# Patient Record
Sex: Male | Born: 1945
Health system: Southern US, Community
[De-identification: ages and names within clinical notes are randomized; demographics above are authoritative.]

## PROBLEM LIST (undated history)

## (undated) DIAGNOSIS — F319 Bipolar disorder, unspecified: Secondary | ICD-10-CM

## (undated) DIAGNOSIS — N138 Other obstructive and reflux uropathy: Secondary | ICD-10-CM

## (undated) DIAGNOSIS — F419 Anxiety disorder, unspecified: Secondary | ICD-10-CM

## (undated) DIAGNOSIS — F329 Major depressive disorder, single episode, unspecified: Secondary | ICD-10-CM

## (undated) DIAGNOSIS — N189 Chronic kidney disease, unspecified: Secondary | ICD-10-CM

## (undated) DIAGNOSIS — I10 Essential (primary) hypertension: Secondary | ICD-10-CM

## (undated) DIAGNOSIS — F32A Depression, unspecified: Secondary | ICD-10-CM

## (undated) DIAGNOSIS — N401 Enlarged prostate with lower urinary tract symptoms: Secondary | ICD-10-CM

## (undated) DIAGNOSIS — I2510 Atherosclerotic heart disease of native coronary artery without angina pectoris: Secondary | ICD-10-CM

## (undated) DIAGNOSIS — N182 Chronic kidney disease, stage 2 (mild): Secondary | ICD-10-CM

## (undated) DIAGNOSIS — N1831 Chronic kidney disease, stage 3a: Secondary | ICD-10-CM

## (undated) HISTORY — DX: Bipolar disorder, unspecified: F31.9

## (undated) HISTORY — DX: Depression, unspecified: F32.A

## (undated) HISTORY — PX: TRANSURETHRAL RESECTION OF PROSTATE: SHX73

## (undated) HISTORY — DX: Major depressive disorder, single episode, unspecified: F32.9

## (undated) HISTORY — DX: Anxiety disorder, unspecified: F41.9

## (undated) HISTORY — DX: Chronic kidney disease, stage 3a: N18.31

## (undated) HISTORY — DX: Benign prostatic hyperplasia with lower urinary tract symptoms: N13.8

## (undated) HISTORY — DX: Other obstructive and reflux uropathy: N40.1

## (undated) HISTORY — PX: PROSTATE SURGERY: SHX751

## (undated) HISTORY — DX: Chronic kidney disease, stage 2 (mild): N18.2

## (undated) HISTORY — DX: Chronic kidney disease, unspecified: N18.9

## (undated) HISTORY — PX: HERNIA REPAIR: SHX51

---

## 2000-08-27 ENCOUNTER — Encounter: Admission: RE | Admit: 2000-08-27 | Discharge: 2000-11-25 | Payer: Self-pay | Admitting: Psychiatry

## 2000-12-24 ENCOUNTER — Encounter: Admission: RE | Admit: 2000-12-24 | Discharge: 2001-03-24 | Payer: Self-pay | Admitting: Psychiatry

## 2001-07-17 ENCOUNTER — Encounter: Payer: Self-pay | Admitting: Emergency Medicine

## 2001-07-17 ENCOUNTER — Emergency Department (HOSPITAL_COMMUNITY): Admission: EM | Admit: 2001-07-17 | Discharge: 2001-07-17 | Payer: Self-pay | Admitting: Emergency Medicine

## 2002-12-14 ENCOUNTER — Encounter: Payer: Self-pay | Admitting: Family Medicine

## 2002-12-14 ENCOUNTER — Encounter: Admission: RE | Admit: 2002-12-14 | Discharge: 2002-12-14 | Payer: Self-pay | Admitting: Family Medicine

## 2010-05-28 ENCOUNTER — Encounter: Payer: Self-pay | Admitting: Cardiology

## 2010-05-28 ENCOUNTER — Ambulatory Visit: Payer: Self-pay | Admitting: Cardiology

## 2010-05-29 ENCOUNTER — Telehealth (INDEPENDENT_AMBULATORY_CARE_PROVIDER_SITE_OTHER): Payer: Self-pay

## 2010-05-30 ENCOUNTER — Ambulatory Visit: Payer: Self-pay

## 2010-05-30 ENCOUNTER — Encounter: Payer: Self-pay | Admitting: Cardiology

## 2010-05-30 ENCOUNTER — Ambulatory Visit: Payer: Self-pay | Admitting: Cardiology

## 2010-05-30 ENCOUNTER — Encounter (HOSPITAL_COMMUNITY): Admission: RE | Admit: 2010-05-30 | Discharge: 2010-07-18 | Payer: Self-pay | Admitting: Cardiology

## 2010-10-16 NOTE — Assessment & Plan Note (Signed)
Summary: Cardiology Nuclear Testing  Nuclear Med Background Indications for Stress Test: Evaluation for Ischemia, Abnormal EKG  Indications Comments: New RBBB   History Comments: NO DOCUMENTED CAD  Symptoms: Chest Tightness, Chest Tightness with Exertion, Fatigue with Exertion  Symptoms Comments: Last episode of CP:3 weeks ago.   Nuclear Pre-Procedure Cardiac Risk Factors: Obesity, RBBB Caffeine/Decaff Intake: none NPO After: 7:30 AM Lungs: Clear IV 0.9% NS with Angio Cath: 22g     IV Site: R Antecubital IV Started by: Bonnita Levan, RN Chest Size (in) 50     Height (in): 71 Weight (lb): 230 BMI: 32.19  Nuclear Med Study 1 or 2 day study:  1 day     Stress Test Type:  Stress Reading MD:  Willa Rough, MD     Referring MD:  Peter Swaziland, MD Resting Radionuclide:  Technetium 21m Tetrofosmin     Resting Radionuclide Dose:  11 mCi  Stress Radionuclide:  Technetium 54m Tetrofosmin     Stress Radionuclide Dose:  33 mCi   Stress Protocol Exercise Time (min):  11:00 min     Max HR:  137 bpm     Predicted Max HR:  158 bpm  Max Systolic BP: 157 mm Hg     Percent Max HR:  86.71 %     METS: 13.5 Rate Pressure Product:  30865    Stress Test Technologist:  [employer]a [employer], CMA-N     Nuclear Technologist:  Domenic Polite, CNMT  Rest Procedure  Myocardial perfusion imaging was performed at rest 45 minutes following the intravenous administration of Technetium 44m Tetrofosmin.  Stress Procedure  The patient exercised for eleven minutes.  The patient stopped due to fatigue and denied any chest pain.  There were no diagnostic ST-T wave changes.  Technetium 46m Tetrofosmin was injected at peak exercise and myocardial perfusion imaging was performed after a brief delay.  QPS Raw Data Images:  Normal; no motion artifact; normal heart/lung ratio. Stress Images:  Normal homogeneous uptake in all areas of the myocardium. Rest Images:  Normal homogeneous uptake in all areas of the  myocardium. Subtraction (SDS):  No evidence of ischemia. Transient Ischemic Dilatation:  1.04  (Normal <1.22)  Lung/Heart Ratio:  .39  (Normal <0.45)  Quantitative Gated Spect Images QGS EDV:  131 ml QGS ESV:  53 ml QGS EF:  59 % QGS cine images:  normal motion  Findings Normal nuclear study      Overall Impression  Exercise Capacity: Good exercise capacity. BP Response: Normal blood pressure response. Clinical Symptoms: SOB ECG Impression: No significant ST segment change suggestive of ischemia. Overall Impression: Normal stress nuclear study.

## 2010-10-16 NOTE — Progress Notes (Signed)
Summary: Nuc. Pre-Procedure  Phone Note Outgoing Call Call back at [facility] Phone [phone]   Call placed by: Irean Hong, RN,  May 29, 2010 2:02 PM Summary of Call: Left message with information on [employer] Information Sheet (see scanned document for details).      Nuclear Med Background Indications for Stress Test: Evaluation for Ischemia, Abnormal EKG  Indications Comments: New RBBB    Symptoms: Chest Pain, Chest Tightness with Exertion, Fatigue with Exertion    Nuclear Pre-Procedure Cardiac Risk Factors: RBBB

## 2011-09-02 ENCOUNTER — Encounter: Payer: Self-pay | Admitting: *Deleted

## 2011-09-02 ENCOUNTER — Other Ambulatory Visit: Payer: Self-pay

## 2011-09-02 ENCOUNTER — Emergency Department (HOSPITAL_COMMUNITY)
Admission: EM | Admit: 2011-09-02 | Discharge: 2011-09-03 | Disposition: A | Discharge status: Home / Self Care | Payer: 59 | Attending: Emergency Medicine | Admitting: Emergency Medicine

## 2011-09-02 ENCOUNTER — Emergency Department (HOSPITAL_COMMUNITY): Payer: 59

## 2011-09-02 DIAGNOSIS — Z79899 Other long term (current) drug therapy: Secondary | ICD-10-CM | POA: Insufficient documentation

## 2011-09-02 DIAGNOSIS — R0789 Other chest pain: Secondary | ICD-10-CM | POA: Insufficient documentation

## 2011-09-02 HISTORY — DX: Atherosclerotic heart disease of native coronary artery without angina pectoris: I25.10

## 2011-09-02 HISTORY — DX: Essential (primary) hypertension: I10

## 2011-09-02 LAB — CBC
MCH: 32 pg (ref 26.0–34.0)
MCHC: 34.4 g/dL (ref 30.0–36.0)
MCV: 93 fL (ref 78.0–100.0)
Platelets: 162 10*3/uL (ref 150–400)
RBC: 4.56 MIL/uL (ref 4.22–5.81)

## 2011-09-02 LAB — DIFFERENTIAL
Basophils Relative: 0 % (ref 0–1)
Eosinophils Absolute: 0.2 10*3/uL (ref 0.0–0.7)
Eosinophils Relative: 3 % (ref 0–5)
Lymphs Abs: 1.3 10*3/uL (ref 0.7–4.0)
Monocytes Relative: 9 % (ref 3–12)

## 2011-09-02 NOTE — ED Notes (Signed)
The pt arrived by gems from home where he was sitting on the cough watching tv  approx 1 hours ago.  By the time ems arrived he is pain had decreased to 6/10 then to 0 enroute without any meds.  No chest pain at present.  rbbb  That is normal for him.  No nv or sob with the pain.  Iv per ems

## 2011-09-02 NOTE — ED Notes (Signed)
The pt has no chest pain now.  He hiked 9 miles today without any pain or discomfort.  He justs wants water.  Wife at the bedside.  pts skin warm and dry no distress

## 2011-09-02 NOTE — ED Notes (Signed)
No chest pain wife still at the bedside

## 2011-09-02 NOTE — ED Provider Notes (Signed)
History     CSN: [id number] Arrival date & time: 09/02/2011  9:34 PM   First MD Initiated Contact with Patient 09/02/11 2138      Chief Complaint  Patient presents with  . Chest Pain    (Consider location/radiation/quality/duration/timing/severity/associated sxs/prior treatment) HPI Comments: Patient states discomfort started at about 6PM.  Earlier in the day he went for a 9 mile hike which is normal for him and he did not experience any symptoms while hiking.  No nausea, diaphoresis, radiation to arm or jaw.  No shortness of breath.  Stress test 2 years ago normal, no other cardiac history.    Patient is a 65 y.o. male presenting with chest pain. The history is provided by the patient.  Chest Pain The chest pain began 3 - 5 hours ago. Duration of episode(s) is 20 minutes. Chest pain occurs rarely. The chest pain is resolved. The pain is currently at 0/10. The severity of the pain is moderate. The pain does not radiate. Pertinent negatives for primary symptoms include no fever, no palpitations and no dizziness. He tried nothing for the symptoms. Risk factors include no known risk factors.     No past medical history on file.  No past surgical history on file.  No family history on file.  History  Substance Use Topics  . Smoking status: Not on file  . Smokeless tobacco: Not on file  . Alcohol Use: Not on file      Review of Systems  Constitutional: Negative for fever.  Cardiovascular: Positive for chest pain. Negative for palpitations.  Neurological: Negative for dizziness.  All other systems reviewed and are negative.    Allergies  Review of patient's allergies indicates no known allergies.  Home Medications   Current Outpatient Rx  Name Route Sig Dispense Refill  . CLONAZEPAM 0.5 MG PO TABS Oral Take 0.5 mg by mouth 2 (two) times daily as needed. For anxiety.     Marland Kitchen LAMOTRIGINE 200 MG PO TABS Oral Take 200 mg by mouth daily.      Marland Kitchen LITHIUM CARBONATE 300 MG PO  CAPS Oral Take 300-600 mg by mouth 2 (two) times daily with a meal. Take 1 capsule (300 MG) in the morning and 2 capsules (600 MG) at night.       BP 129/81  Pulse 79  Temp 98.3 F (36.8 C)  Resp 20  SpO2 97%  Physical Exam  Nursing note and vitals reviewed. Constitutional: He is oriented to person, place, and time. He appears well-developed and well-nourished. No distress.  HENT:  Head: Normocephalic and atraumatic.  Neck: Normal range of motion. Neck supple.  Cardiovascular: Normal rate and regular rhythm.  Exam reveals no friction rub.   No murmur heard. Pulmonary/Chest: Effort normal and breath sounds normal. No respiratory distress. He has no wheezes.  Abdominal: Soft. Bowel sounds are normal. He exhibits no distension. There is no tenderness.  Musculoskeletal: Normal range of motion. He exhibits no edema.  Neurological: He is alert and oriented to person, place, and time.  Skin: Skin is warm and dry. He is not diaphoretic.    ED Course  Procedures (including critical care time)  Labs Reviewed - No data to display No results found.   No diagnosis found.   Date: 09/02/2011  Rate: 79  Rhythm: normal sinus rhythm  QRS Axis: normal  Intervals: normal  ST/T Wave abnormalities: indeterminate  Conduction Disutrbances:right bundle branch block  Narrative Interpretation:   Old EKG Reviewed: none available  MDM  Christian Parsons symptoms are atypical for cardiac pain.  The ekg shows a RBBB which is old, and the enzymes are negative.  He hikes 9 miles several days per week and has not had any discomfort or shortness of breath.  The patient would like to go home and tells me he can get in to see Dr. Swaziland in the very near future.  Will discharge to home, and return prn.          Christian Lyons, MD 09/03/11 [phone]

## 2011-09-03 ENCOUNTER — Encounter (HOSPITAL_COMMUNITY): Payer: Self-pay | Admitting: *Deleted

## 2011-09-03 LAB — CK TOTAL AND CKMB (NOT AT ARMC)
CK, MB: 5.9 ng/mL — ABNORMAL HIGH (ref 0.3–4.0)
Relative Index: 4.1 — ABNORMAL HIGH (ref 0.0–2.5)
Total CK: 143 U/L (ref 7–232)

## 2011-09-03 LAB — COMPREHENSIVE METABOLIC PANEL
BUN: 21 mg/dL (ref 6–23)
Calcium: 9.4 mg/dL (ref 8.4–10.5)
GFR calc Af Amer: 61 mL/min — ABNORMAL LOW (ref >90)
Glucose, Bld: 111 mg/dL — ABNORMAL HIGH (ref 70–99)
Sodium: 141 mEq/L (ref 135–145)
Total Protein: 6.7 g/dL (ref 6.0–8.3)

## 2011-09-03 LAB — TROPONIN I: Troponin I: <0.3 ng/mL (ref <0.30)

## 2011-11-15 ENCOUNTER — Telehealth: Payer: Self-pay

## 2011-11-15 DIAGNOSIS — M25572 Pain in left ankle and joints of left foot: Secondary | ICD-10-CM

## 2011-11-15 NOTE — Telephone Encounter (Signed)
PT STATES IN THE PAST DR DOOLITTLE HAD REFERRED PT TO AN ORTHO FOR ORTHOTICS. PTS STATES THE ORTHOTICS HAVE WORN OUT AND HE NEEDS A REFERRAL TO AN ORTHO TO GET ANOTHER SET OF ORTHOTIC. PLEASE CALL PT AT HOME NUMBER TO ADVISE

## 2011-11-15 NOTE — Telephone Encounter (Signed)
Did the referral

## 2011-11-15 NOTE — Telephone Encounter (Signed)
Patient had CPE  In February 2012... And seen last Oct 2012, can we refer?

## 2011-12-20 ENCOUNTER — Encounter: Payer: Self-pay | Admitting: *Deleted

## 2011-12-20 DIAGNOSIS — F39 Unspecified mood [affective] disorder: Secondary | ICD-10-CM | POA: Insufficient documentation

## 2011-12-25 ENCOUNTER — Ambulatory Visit (INDEPENDENT_AMBULATORY_CARE_PROVIDER_SITE_OTHER): Payer: 59 | Admitting: Internal Medicine

## 2011-12-25 ENCOUNTER — Encounter: Payer: Self-pay | Admitting: Internal Medicine

## 2011-12-25 VITALS — BP 125/74 | HR 63 | Temp 98.2°F | Resp 16 | Ht 72.0 in | Wt 234.6 lb

## 2011-12-25 DIAGNOSIS — G43109 Migraine with aura, not intractable, without status migrainosus: Secondary | ICD-10-CM

## 2011-12-25 DIAGNOSIS — Z6831 Body mass index (BMI) 31.0-31.9, adult: Secondary | ICD-10-CM | POA: Insufficient documentation

## 2011-12-25 DIAGNOSIS — R972 Elevated prostate specific antigen [PSA]: Secondary | ICD-10-CM

## 2011-12-25 DIAGNOSIS — I4510 Unspecified right bundle-branch block: Secondary | ICD-10-CM

## 2011-12-25 DIAGNOSIS — Z0000 Encounter for general adult medical examination without abnormal findings: Secondary | ICD-10-CM

## 2011-12-25 LAB — COMPREHENSIVE METABOLIC PANEL
AST: 21 U/L (ref 0–37)
Albumin: 4.4 g/dL (ref 3.5–5.2)
BUN: 18 mg/dL (ref 6–23)
Calcium: 10 mg/dL (ref 8.4–10.5)
Chloride: 110 mEq/L (ref 96–112)
Potassium: 4.7 mEq/L (ref 3.5–5.3)

## 2011-12-25 LAB — LIPID PANEL: LDL Cholesterol: 64 mg/dL (ref 0–99)

## 2011-12-25 LAB — POCT URINALYSIS DIPSTICK
Blood, UA: NEGATIVE
Glucose, UA: NEGATIVE
Nitrite, UA: NEGATIVE
Protein, UA: NEGATIVE
Urobilinogen, UA: 0.2
pH, UA: 6.5

## 2011-12-25 LAB — CBC WITH DIFFERENTIAL/PLATELET
Basophils Absolute: 0 10*3/uL (ref 0.0–0.1)
HCT: 44.9 % (ref 39.0–52.0)
Hemoglobin: 15.2 g/dL (ref 13.0–17.0)
Lymphocytes Relative: 22 % (ref 12–46)
Monocytes Absolute: 0.4 10*3/uL (ref 0.1–1.0)
Monocytes Relative: 7 % (ref 3–12)
Neutro Abs: 3.6 10*3/uL (ref 1.7–7.7)
RBC: 4.76 MIL/uL (ref 4.22–5.81)
RDW: 12.8 % (ref 11.5–15.5)
WBC: 5.2 10*3/uL (ref 4.0–10.5)

## 2011-12-25 MED ORDER — RIZATRIPTAN BENZOATE 10 MG PO TABS: 10.0000 mg | ORAL_TABLET | ORAL | Status: DC | PRN

## 2011-12-25 NOTE — Progress Notes (Signed)
Subjective:    Patient ID: Christian Parsons, male    DOB: [birth year], 66 y.o.   MRN: [id number]  HPI Annual exam Patient Active Problem List  Diagnoses  . Bipolar disorder  . Migraine headache with aura  . BMI 31.0-31.9,adult  . Right bundle branch block  He is doing very well In the summer he had an episode of substernal chest burning and discomfort. EMS took him to the emergency room where her EKG was unchanged from past and enzymes were normal. He's had no recurrence of that. He continues to notice a mild shortness of breath when he exerts himself. Dr. Swaziland did a cardiac evaluation with ETT which was normal 2 and half years ago. He walks daily and Hikes 9-10 miles at least twice a week. He is planning an 8 day-80 mile hike through [place] next month.  He continues on Zyflamend And he has no prostate symptoms and a normal PSA since his normal biopsy.  Dr. Lottie Mussel has called him for a five-year followup colonoscopy. This would be his third colonoscopy and he is reluctant, wanting to wait a full 10 years.  His migraine headaches Are stable and respond to Maxalt  He continues to see Dr. Nolen Mu for bipolar disorder which is very stable on his medications.  Social history: he loves retirement   Review of Systems  Constitutional: Negative.        He continues to strive for weight loss. He had a high in the 270s and is now 234. He hopes to increase walking to achieve more results.  HENT: Negative.   Eyes: Negative.   Respiratory: Negative.   Cardiovascular: Negative.   Gastrointestinal: Negative.   Genitourinary: Negative.   Musculoskeletal: Negative.   Skin: Negative.        His chronic nail fungus on his toes does not cause pain  Neurological: Negative.   Hematological: Negative.   Psychiatric/Behavioral: Negative.        Objective:   Physical Exam  Constitutional: He is oriented to person, place, and time. He appears well-developed and well-nourished.  HENT:    Head: Normocephalic.  Nose: Nose normal.  Mouth/Throat: Oropharynx is clear and moist.  Eyes: Conjunctivae and EOM are normal. Pupils are equal, round, and reactive to light.  Neck: Normal range of motion. Neck supple. No thyromegaly present.  Cardiovascular: Normal rate, regular rhythm, normal heart sounds and intact distal pulses.  Exam reveals no gallop.   No murmur heard.      No carotid or abdominal bruits  Pulmonary/Chest: Effort normal and breath sounds normal. He has no wheezes.  Abdominal: Soft. Bowel sounds are normal. He exhibits no mass. There is no tenderness.       No organomegaly  Genitourinary:       He elects no prostate exam and I agree with him  Musculoskeletal: Normal range of motion. He exhibits no edema and no tenderness.  Neurological: He is alert and oriented to person, place, and time. He has normal reflexes. No cranial nerve deficit.  Skin:       Clear except for fungal changes on all the males of his lower extremities  Psychiatric: He has a normal mood and affect. His behavior is normal. Judgment and thought content normal.    EKG repeated and shows no sign of acute or chronic problems other than the right bundle branch block which is stable      Assessment & Plan:  Annual exam Patient Active Problem List  Diagnoses  .  Bipolar disorder  . Migraine headache with aura  . BMI 31.0-31.9,adult  . Right bundle branch block   Maxalt refills for one year His other medicines come from his psychiatrist He is advised to try a training program that involves interval training walking up hill/if that presents a problem over 12 weeks we will consider a cardiology followup and whether or not catheterization is needed.

## 2011-12-30 ENCOUNTER — Encounter: Payer: Self-pay | Admitting: Internal Medicine

## 2012-01-14 ENCOUNTER — Ambulatory Visit: Payer: 59 | Admitting: Family Medicine

## 2012-01-14 VITALS — BP 108/68 | HR 81 | Temp 97.5°F | Resp 18 | Ht 72.5 in | Wt 232.0 lb

## 2012-01-14 DIAGNOSIS — J329 Chronic sinusitis, unspecified: Secondary | ICD-10-CM

## 2012-01-14 DIAGNOSIS — J40 Bronchitis, not specified as acute or chronic: Secondary | ICD-10-CM

## 2012-01-14 MED ORDER — AMOXICILLIN 875 MG PO TABS: 875.0000 mg | ORAL_TABLET | Freq: Two times a day (BID) | ORAL | Status: AC

## 2012-01-14 MED ORDER — BENZONATATE 100 MG PO CAPS: ORAL_CAPSULE | ORAL | Status: AC

## 2012-01-14 MED ORDER — FLUTICASONE PROPIONATE 50 MCG/ACT NA SUSP: 2.0000 | Freq: Every day | NASAL | Status: DC

## 2012-01-14 MED ORDER — HYDROCODONE-HOMATROPINE 5-1.5 MG/5ML PO SYRP: 5.0000 mL | ORAL_SOLUTION | ORAL | Status: AC | PRN

## 2012-01-14 NOTE — Progress Notes (Signed)
Subjective: Christian Parsons was here for a physical examination several weeks ago. He probably went and got ill after being here. He developed a sore throat, head congestion, cough, and just not feeling well. He is taking a lot of OTC cough and cold medications without good relief. He has been blowing some purulent stuff from his nose, low bit bloated today. He also coughs up some stuff. He may have had a little bit of wheezing. His throat is not as sore as it was.  He had questions about his cat which has been ill with similar symptoms, and wondering whether he could have gotten anything there.  He hikes, and is present at the hiking club. They'll be going to [place] in June 2 hikes. He took a 7 mile walk the other day, but not to call extraneous it sounds like.  He's not been running a fever just doesn't feel good with this. The cough is more in the daytime but it wakes him coughing sometimes at night.   Objective: White male, appearing like he doesn't feel real well. He says he has lost 50 pounds since he started doing this hiking. TMs are normal. Throat has mild erythema, looks like some postnasal drainage type appearance. Neck supple without significant nodes. Chest has some minimal rhonchi at the bases. Although he does not have great air exchange, do not hear any well-defined wheezing. He is continuing to cough.  Assessment: Sinusitis/bronchitis  Plan: I do not believe that labs and x-rays are needed at this time.  It has been going on a long period of time so am  going ahead and treating him with antibiotics and cough medications. If it is getting worse then we would have to do additional workup.  I do not have answers about any cat to human transmission of significant respiratory tract infections.

## 2012-01-14 NOTE — Patient Instructions (Signed)

## 2012-03-11 ENCOUNTER — Other Ambulatory Visit: Payer: Self-pay | Admitting: Emergency Medicine

## 2012-03-11 ENCOUNTER — Ambulatory Visit: Payer: 59 | Admitting: Emergency Medicine

## 2012-03-11 VITALS — BP 124/76 | HR 63 | Temp 98.4°F | Resp 16 | Ht 72.0 in | Wt 229.4 lb

## 2012-03-11 DIAGNOSIS — D235 Other benign neoplasm of skin of trunk: Secondary | ICD-10-CM

## 2012-03-11 DIAGNOSIS — K629 Disease of anus and rectum, unspecified: Secondary | ICD-10-CM

## 2012-03-11 NOTE — Progress Notes (Signed)
  Subjective:    Patient ID: Christian Parsons, male    DOB: 1946-05-02, 66 y.o.   MRN: [id number]  HPI patient enters with a long history of a growth-like area on his left buttocks. He enters today to have it removed because it has been giving him pain in the area    Review of Systems     Objective:   Physical Exam there is a 1 x 1 cm raised firm nodular area over the left buttocks. This area was prepped with Betadine and numbed with 1% removed with iris scissors base cauterized with silver nitrate patient tolerated the procedure well        Assessment & Plan:  Lesion left buttocks. I suspect this lesion is benign. Area removed and sent for path

## 2012-03-18 ENCOUNTER — Encounter: Payer: Self-pay | Admitting: Family Medicine

## 2012-07-24 ENCOUNTER — Ambulatory Visit: Payer: 59 | Admitting: Radiology

## 2012-07-24 VITALS — BP 120/74

## 2012-07-24 DIAGNOSIS — Z23 Encounter for immunization: Secondary | ICD-10-CM

## 2012-11-03 ENCOUNTER — Ambulatory Visit (INDEPENDENT_AMBULATORY_CARE_PROVIDER_SITE_OTHER): Payer: 59 | Admitting: Physician Assistant

## 2012-11-03 VITALS — BP 127/83 | HR 69 | Temp 98.1°F | Resp 16 | Ht 71.0 in | Wt 231.0 lb

## 2012-11-03 DIAGNOSIS — M79646 Pain in unspecified finger(s): Secondary | ICD-10-CM

## 2012-11-03 DIAGNOSIS — L03019 Cellulitis of unspecified finger: Secondary | ICD-10-CM

## 2012-11-03 DIAGNOSIS — M79609 Pain in unspecified limb: Secondary | ICD-10-CM

## 2012-11-03 DIAGNOSIS — L03012 Cellulitis of left finger: Secondary | ICD-10-CM

## 2012-11-03 DIAGNOSIS — L989 Disorder of the skin and subcutaneous tissue, unspecified: Secondary | ICD-10-CM

## 2012-11-03 MED ORDER — SULFAMETHOXAZOLE-TRIMETHOPRIM 800-160 MG PO TABS: 1.0000 | ORAL_TABLET | Freq: Two times a day (BID) | ORAL | Status: DC

## 2012-11-03 NOTE — Progress Notes (Addendum)
  Subjective:    Patient ID: Christian Parsons, male    DOB: 1946/04/10, 67 y.o.   MRN: [id number]  HPI This 67 y.o. male presents for evaluation of pain and swelling of the LEFT thumb after "stubbing the nail" on a hike about a week ago.  Has gotten substantially worse over the past 24 hours.  Additionally, he notes a "mole" on the LEFT middle finger that he "bumped" and is now sore and irritated.  No fever, chills.  No GI/GU symptoms.  He is RIGHT hand dominant.   Past Medical History  Diagnosis Date  . Coronary artery disease   . Hypertension   . Bipolar disorder     H/O    History reviewed. No pertinent past surgical history.  Prior to Admission medications   Medication Sig Start Date End Date Taking? Authorizing Provider  aspirin 81 MG tablet Take 81 mg by mouth daily.   Yes Historical Provider, MD  clonazePAM (KLONOPIN) 0.5 MG tablet Take 0.5 mg by mouth 2 (two) times daily as needed. For anxiety.    Yes Historical Provider, MD  lamoTRIgine (LAMICTAL) 200 MG tablet Take 200 mg by mouth daily.     Yes Historical Provider, MD  lithium carbonate 300 MG capsule Take 300-600 mg by mouth 2 (two) times daily with a meal. Take 1 capsule (300 MG) in the morning and 2 capsules (600 MG) at night.    Yes Historical Provider, MD  rizatriptan (MAXALT) 10 MG tablet Take 1 tablet (10 mg total) by mouth as needed. May repeat in 2 hours if needed 12/25/11  Yes Tonye Pearson, MD  fluticasone [facility]) 50 MCG/ACT nasal spray Place 2 sprays into the nose daily. 01/14/12 01/13/13  Peyton Najjar, MD  OVER THE COUNTER MEDICATION Take by mouth 2 (two) times daily. Zyflamend    Historical Provider, MD    No Known Allergies  History   Social History  . Marital Status: Married   Occupational History  . Retired Firefighter   Social History Main Topics  . Smoking status: Never Smoker   . Alcohol Use: Yes  . Drug Use: No    History reviewed. No pertinent family history.   Review of  Systems As above.    Objective:   Physical Exam Blood pressure 127/83, pulse 69, temperature 98.1 F (36.7 C), temperature source Oral, resp. rate 16, height 5\' 11"  (1.803 m), weight 231 lb (104.781 kg), SpO2 100.00%. Body mass index is 32.23 kg/(m^2). Well-developed, well nourished WM who is awake, alert and oriented, in NAD. HEENT: [place]/AT, sclera and conjunctiva are clear.   Heart: RRR Lungs: normal effort Extremities: no cyanosis, clubbing.  LEFT thumb is noted with erythema and edema around the nail, consistent with paronychia. Very tender. Skin: warm and dry. Changes consistent with paronychia, as above.   Psychologic: good mood and appropriate affect, normal speech and behavior.  PROCEDURE: VCO. Cleansed with alcohol pad and 11 blade used to incise along nail fold.  Large purulence expressed.  Cleansed and dressed.      Assessment & Plan:  Paronychia of left thumb - Plan: Wound culture, sulfamethoxazole-trimethoprim (BACTRIM DS,SEPTRA DS) 800-160 MG per tablet.  Local wound care.  Anticipatory guidance provided.  Lesion of finger  Pain in finger

## 2012-11-03 NOTE — Patient Instructions (Signed)
Please soak the thumb in warm water for 15-20 minutes 2-3 times each day.  Wash daily with soap and water. Apply an antibiotic ointment and bandaid until the wound is healed.  If you have increasing pain, drainage, redness or swelling, please return for re-evaluation.  The finger wound may get "angry" before it heals.  If so, wash daily with soap and water and apply an antibiotic ointment and a bandaid. If any lesion remains present in 2 weeks, return for re-evaluation and possible re-treatment.

## 2012-11-05 LAB — WOUND CULTURE

## 2013-01-13 ENCOUNTER — Encounter: Payer: 59 | Admitting: Internal Medicine

## 2013-02-05 ENCOUNTER — Other Ambulatory Visit: Payer: Self-pay | Admitting: Internal Medicine

## 2013-04-07 ENCOUNTER — Encounter: Payer: 59 | Admitting: Internal Medicine

## 2013-04-09 DIAGNOSIS — F319 Bipolar disorder, unspecified: Secondary | ICD-10-CM | POA: Diagnosis not present

## 2013-04-13 DIAGNOSIS — F319 Bipolar disorder, unspecified: Secondary | ICD-10-CM | POA: Diagnosis not present

## 2013-04-27 DIAGNOSIS — F319 Bipolar disorder, unspecified: Secondary | ICD-10-CM | POA: Diagnosis not present

## 2013-05-27 DIAGNOSIS — F319 Bipolar disorder, unspecified: Secondary | ICD-10-CM | POA: Diagnosis not present

## 2013-06-01 ENCOUNTER — Other Ambulatory Visit: Payer: Self-pay | Admitting: Internal Medicine

## 2013-06-16 ENCOUNTER — Ambulatory Visit (INDEPENDENT_AMBULATORY_CARE_PROVIDER_SITE_OTHER): Payer: Medicare Other | Admitting: Internal Medicine

## 2013-06-16 ENCOUNTER — Encounter: Payer: Self-pay | Admitting: Internal Medicine

## 2013-06-16 VITALS — BP 134/72 | HR 57 | Temp 97.6°F | Resp 16 | Ht 71.3 in | Wt 217.0 lb

## 2013-06-16 DIAGNOSIS — Z125 Encounter for screening for malignant neoplasm of prostate: Secondary | ICD-10-CM

## 2013-06-16 DIAGNOSIS — Z5181 Encounter for therapeutic drug level monitoring: Secondary | ICD-10-CM

## 2013-06-16 DIAGNOSIS — N429 Disorder of prostate, unspecified: Secondary | ICD-10-CM | POA: Diagnosis not present

## 2013-06-16 DIAGNOSIS — Z23 Encounter for immunization: Secondary | ICD-10-CM

## 2013-06-16 DIAGNOSIS — G250 Essential tremor: Secondary | ICD-10-CM

## 2013-06-16 DIAGNOSIS — Z13220 Encounter for screening for lipoid disorders: Secondary | ICD-10-CM

## 2013-06-16 DIAGNOSIS — Z0000 Encounter for general adult medical examination without abnormal findings: Secondary | ICD-10-CM | POA: Diagnosis not present

## 2013-06-16 DIAGNOSIS — Z136 Encounter for screening for cardiovascular disorders: Secondary | ICD-10-CM

## 2013-06-16 DIAGNOSIS — F319 Bipolar disorder, unspecified: Secondary | ICD-10-CM

## 2013-06-16 LAB — CBC WITH DIFFERENTIAL/PLATELET
Eosinophils Absolute: 0.1 10*3/uL (ref 0.0–0.7)
Eosinophils Relative: 2 % (ref 0–5)
HCT: 44.2 % (ref 39.0–52.0)
Hemoglobin: 15.5 g/dL (ref 13.0–17.0)
Lymphs Abs: 0.8 10*3/uL (ref 0.7–4.0)
MCH: 32.7 pg (ref 26.0–34.0)
MCV: 93.2 fL (ref 78.0–100.0)
Monocytes Absolute: 0.4 10*3/uL (ref 0.1–1.0)
Monocytes Relative: 8 % (ref 3–12)
RBC: 4.74 MIL/uL (ref 4.22–5.81)

## 2013-06-16 LAB — POCT URINALYSIS DIPSTICK
Blood, UA: NEGATIVE
Glucose, UA: NEGATIVE
Nitrite, UA: NEGATIVE
Protein, UA: NEGATIVE
Urobilinogen, UA: 0.2

## 2013-06-16 LAB — COMPREHENSIVE METABOLIC PANEL
CO2: 28 mEq/L (ref 19–32)
Calcium: 9.8 mg/dL (ref 8.4–10.5)
Creat: 1.41 mg/dL — ABNORMAL HIGH (ref 0.50–1.35)
Glucose, Bld: 95 mg/dL (ref 70–99)
Total Bilirubin: 0.9 mg/dL (ref 0.3–1.2)
Total Protein: 6.8 g/dL (ref 6.0–8.3)

## 2013-06-16 NOTE — Progress Notes (Signed)
Subjective:    Patient ID: Christian Parsons, male    DOB: 1946/05/01, 67 y.o.   MRN: [id number]  HPI Patient presents today for CPE Patient continues to hike and has lost more weight. Last trip hiking in [place]. Last CPE- 4/13 Colonoscopy-2011, needs 2016 Tetanus-  Flu- today  Happy in retirement  Would like to have EKG today.   Wife, Christian Parsons, had fever, chest congestion, tx with antibiotics several times. Started in March. Was sick 3x, was seen here and at [facility]. Eventually saw Dr. Katrinka Blazing who sent her to Dr. Ilsa Iha. Diagnosed with blood infection. Was hospitalized for 4 days and treated for strep. mutans. Thinks this followed a dental cleaning. Diagnosed with endocarditis, but vegetation not visible. Still on IV antibiotics through PICC line.   Old tremor. Father had tremor. Lithium makes it worse. Reduction helps some.  Decreased concentration lately. Plays a lot of online chess against other people and his rank has decreased and he is unable to finish as before. Thinks this is related to worrying about his wife's illness. Hiking helps with this.  A little more difficulty with urine stream over the last year and a half. Doesn't want to see urologist, "they almost killed me and they only get one shot." Open to having PSA checked today. Nocturia x 1. Feels like he urinates frequently than others.  Review of Systems    see cna note. 13 systems. No new sxtoms Objective:   Physical Exam  Constitutional: He is oriented to person, place, and time. He appears well-developed and well-nourished.  HENT:  Head: Normocephalic.  Right Ear: External ear normal.  Left Ear: External ear normal.  Nose: Nose normal.  Mouth/Throat: Oropharynx is clear and moist.  Tms and canals clear  Eyes: Conjunctivae and EOM are normal. Pupils are equal, round, and reactive to light.  Neck: Normal range of motion. Neck supple. No thyromegaly present.  Cardiovascular: Normal rate, regular rhythm, normal heart  sounds and intact distal pulses.   No murmur heard. Pulmonary/Chest: Effort normal and breath sounds normal. No respiratory distress. He has no wheezes. He has no rales.  Abdominal: Soft. Bowel sounds are normal. He exhibits no distension and no mass. There is no tenderness. There is no rebound and no guarding.  No hepatosplenomegaly  Musculoskeletal: Normal range of motion. He exhibits no edema and no tenderness.  Lymphadenopathy:    He has no cervical adenopathy.  Neurological: He is alert and oriented to person, place, and time. He has normal reflexes. No cranial nerve deficit. He exhibits normal muscle tone. Coordination normal.  Skin: Skin is warm and dry. No rash noted.  Psychiatric: He has a normal mood and affect. His behavior is normal. Judgment and thought content normal.    ekg unchanged      Assessment & Plan:  cpe stable  Walking 19 miles 3300 ft elevation tomorrow!  Routine general medical examination at a health care facility - Plan: EKG 12-Lead, CBC with Differential, Comprehensive metabolic panel, PSA, POCT urinalysis dipstick  Need for prophylactic vaccination and inoculation against influenza - Plan: Flu Vaccine QUAD 36+ mos IM  Bipolar disorder - Plan: TSH  Benign essential tremor  Screening for prostate cancer - Plan: PSA  Screening, lipid  Medication monitoring encounter - Plan: CBC with Differential, TSH  Disorder of prostate  - Plan: PSA  Screening for cardiovascular condition   Need for prophylactic vaccination with combined diphtheria-tetanus-pertussis (DTP) vaccine - Plan: Tdap vaccine greater than or equal to 7yo  IM

## 2013-06-16 NOTE — Progress Notes (Signed)
  Subjective:    Patient ID: Christian Parsons, male    DOB: 05/07/1946, 67 y.o.   MRN: [id number]  HPI    Review of Systems  Constitutional: Negative.   HENT: Negative.   Eyes: Negative.   Respiratory: Negative.   Cardiovascular: Negative.   Gastrointestinal: Negative.   Endocrine: Negative.   Genitourinary: Positive for frequency and difficulty urinating.  Musculoskeletal: Negative.   Skin: Negative.   Allergic/Immunologic: Negative.   Neurological: Positive for tremors.  Hematological: Negative.   Psychiatric/Behavioral: Positive for decreased concentration.       Objective:   Physical Exam        Assessment & Plan:

## 2013-06-17 LAB — TSH: TSH: 1.773 u[IU]/mL (ref 0.350–4.500)

## 2013-06-17 LAB — PSA: PSA: 4.31 ng/mL — ABNORMAL HIGH (ref <=4.00)

## 2013-06-21 ENCOUNTER — Encounter: Payer: Self-pay | Admitting: Internal Medicine

## 2013-06-24 DIAGNOSIS — F319 Bipolar disorder, unspecified: Secondary | ICD-10-CM | POA: Diagnosis not present

## 2013-07-08 DIAGNOSIS — F319 Bipolar disorder, unspecified: Secondary | ICD-10-CM | POA: Diagnosis not present

## 2013-07-20 DIAGNOSIS — F319 Bipolar disorder, unspecified: Secondary | ICD-10-CM | POA: Diagnosis not present

## 2013-07-28 ENCOUNTER — Ambulatory Visit (INDEPENDENT_AMBULATORY_CARE_PROVIDER_SITE_OTHER): Payer: Medicare Other | Admitting: Internal Medicine

## 2013-07-28 ENCOUNTER — Encounter: Payer: Self-pay | Admitting: Internal Medicine

## 2013-07-28 VITALS — BP 144/88 | HR 82 | Temp 97.7°F | Resp 16 | Ht 72.0 in | Wt 219.4 lb

## 2013-07-28 DIAGNOSIS — R799 Abnormal finding of blood chemistry, unspecified: Secondary | ICD-10-CM | POA: Diagnosis not present

## 2013-07-28 DIAGNOSIS — R300 Dysuria: Secondary | ICD-10-CM

## 2013-07-28 DIAGNOSIS — R7989 Other specified abnormal findings of blood chemistry: Secondary | ICD-10-CM

## 2013-07-28 LAB — POCT URINALYSIS DIPSTICK
Bilirubin, UA: NEGATIVE
Glucose, UA: NEGATIVE
Ketones, UA: NEGATIVE
Nitrite, UA: NEGATIVE
pH, UA: 7

## 2013-07-28 LAB — POCT UA - MICROSCOPIC ONLY: Yeast, UA: NEGATIVE

## 2013-07-28 LAB — BASIC METABOLIC PANEL
BUN: 18 mg/dL (ref 6–23)
Calcium: 9.9 mg/dL (ref 8.4–10.5)
Potassium: 4.4 mEq/L (ref 3.5–5.3)

## 2013-07-28 MED ORDER — CIPROFLOXACIN HCL 500 MG PO TABS: 500.0000 mg | ORAL_TABLET | Freq: Two times a day (BID) | ORAL | Status: DC

## 2013-07-28 NOTE — Progress Notes (Signed)
  Subjective:    Patient ID: Christian Parsons, male    DOB: Nov 17, 1945, 67 y.o.   MRN: [id number]  HPIc/o intermitt dysu and freq for 2 weeks Last week episode LLQ/groin pain No hematuria No fever No new partners Hx pros enlrg w/ abn PSA foll by urol Nocturia x4 this week   See last cr---often this level  Review of Systems Non contr    Objective:   Physical Exam BP 144/88  Pulse 82  Temp(Src) 97.7 F (36.5 C) (Oral)  Resp 16  Ht 6' (1.829 m)  Wt 219 lb 6.4 oz (99.519 kg)  BMI 29.75 kg/m2  SpO2 98% abd benign No flank tend No hernia No d/c no groin or test pain  Results for orders placed in visit on 07/28/13  BASIC METABOLIC PANEL      Result Value Range   Sodium 140  135 - 145 mEq/L   Potassium 4.4  3.5 - 5.3 mEq/L   Chloride 104  96 - 112 mEq/L   CO2 26  19 - 32 mEq/L   Glucose, Bld 92  70 - 99 mg/dL   BUN 18  6 - 23 mg/dL   Creat 2.44 (*) 0.10 - 1.35 mg/dL   Calcium 9.9  8.4 - 27.2 mg/dL  POCT UA - MICROSCOPIC ONLY      Result Value Range   WBC, Ur, HPF, POC 0-4     RBC, urine, microscopic 0-1     Bacteria, U Microscopic neg     Mucus, UA neg     Epithelial cells, urine per micros 0-1     Crystals, Ur, HPF, POC neg     Casts, Ur, LPF, POC neg     Yeast, UA neg    POCT URINALYSIS DIPSTICK      Result Value Range   Color, UA yellow     Clarity, UA clear     Glucose, UA neg     Bilirubin, UA neg     Ketones, UA neg     Spec Grav, UA 1.015     Blood, UA neg     pH, UA 7.0     Protein, UA neg     Urobilinogen, UA 0.2     Nitrite, UA neg     Leukocytes, UA Trace          Assessment & Plan:  Elevated serum creatinine - Plan: Basic metabolic panel  Dysuria - Plan:  Urine culture  prostat vs uti  cipro 500bid call

## 2013-07-30 LAB — URINE CULTURE: Organism ID, Bacteria: NO GROWTH

## 2013-08-02 ENCOUNTER — Telehealth: Payer: Self-pay

## 2013-08-02 NOTE — Telephone Encounter (Signed)
Patient came in, he does not want to see a nephrologist, he wants to know if you can just continue to keep an eye on this, he is encouraged to make sure he gets plenty of fluids.

## 2013-08-02 NOTE — Telephone Encounter (Signed)
This is fine since it is stable Did cipro help?

## 2013-08-02 NOTE — Telephone Encounter (Signed)
PT STATES HE NEED TO SEE ANOTHER DR AND WANTED TO KNOW IF HIS CREATINE LEVELS WERE BACK PLEASE CALL [phone] AND WOULD LIKE TO KNOW ASAP

## 2013-08-02 NOTE — Telephone Encounter (Signed)
Sorry, did not ask this, it is on the lab. I have called and his wife advised he has improved on the cipro, he was mainly concerned about the labs. They are aware we can continue to follow the labs, thanks.

## 2013-08-02 NOTE — Telephone Encounter (Signed)
What would you like me to advise concerning labs?

## 2013-08-02 NOTE — Telephone Encounter (Signed)
Thanks. Called to advise. Left message for him to call me back.

## 2013-08-02 NOTE — Telephone Encounter (Signed)
Call to say the same as in the past--slightly up Unsure if related to long term lithium or not We can ask nephrology for an opinion next if he wishes

## 2013-08-05 DIAGNOSIS — F319 Bipolar disorder, unspecified: Secondary | ICD-10-CM | POA: Diagnosis not present

## 2013-08-18 DIAGNOSIS — F319 Bipolar disorder, unspecified: Secondary | ICD-10-CM | POA: Diagnosis not present

## 2013-08-23 ENCOUNTER — Encounter: Payer: Self-pay | Admitting: Cardiology

## 2013-08-25 ENCOUNTER — Encounter: Payer: Self-pay | Admitting: Internal Medicine

## 2013-08-25 ENCOUNTER — Ambulatory Visit (INDEPENDENT_AMBULATORY_CARE_PROVIDER_SITE_OTHER): Payer: Medicare Other | Admitting: Internal Medicine

## 2013-08-25 VITALS — BP 130/80 | HR 58 | Temp 97.9°F | Resp 16 | Ht 72.0 in | Wt 218.6 lb

## 2013-08-25 DIAGNOSIS — R799 Abnormal finding of blood chemistry, unspecified: Secondary | ICD-10-CM | POA: Diagnosis not present

## 2013-08-25 DIAGNOSIS — R7989 Other specified abnormal findings of blood chemistry: Secondary | ICD-10-CM

## 2013-08-25 LAB — POCT URINALYSIS DIPSTICK
Bilirubin, UA: NEGATIVE
Glucose, UA: NEGATIVE
Leukocytes, UA: NEGATIVE
Nitrite, UA: NEGATIVE
Protein, UA: NEGATIVE
Spec Grav, UA: 1.01
Urobilinogen, UA: 0.2

## 2013-08-25 LAB — POCT UA - MICROSCOPIC ONLY
Bacteria, U Microscopic: NEGATIVE
Crystals, Ur, HPF, POC: NEGATIVE
WBC, Ur, HPF, POC: NEGATIVE

## 2013-08-27 ENCOUNTER — Encounter: Payer: Self-pay | Admitting: Internal Medicine

## 2013-08-27 NOTE — Progress Notes (Signed)
F/u sl abn cr Stable values over last several labs Suspect due to lithium and he understands--can't live w/out lithium!!!  F/U as planned

## 2013-08-31 ENCOUNTER — Other Ambulatory Visit: Payer: Self-pay | Admitting: Internal Medicine

## 2013-09-03 DIAGNOSIS — F319 Bipolar disorder, unspecified: Secondary | ICD-10-CM | POA: Diagnosis not present

## 2013-10-01 DIAGNOSIS — F319 Bipolar disorder, unspecified: Secondary | ICD-10-CM | POA: Diagnosis not present

## 2013-10-15 DIAGNOSIS — F319 Bipolar disorder, unspecified: Secondary | ICD-10-CM | POA: Diagnosis not present

## 2013-10-27 DIAGNOSIS — F319 Bipolar disorder, unspecified: Secondary | ICD-10-CM | POA: Diagnosis not present

## 2013-11-14 ENCOUNTER — Ambulatory Visit (INDEPENDENT_AMBULATORY_CARE_PROVIDER_SITE_OTHER): Payer: Medicare Other | Admitting: Family Medicine

## 2013-11-14 VITALS — BP 118/73 | HR 69 | Temp 98.2°F | Resp 18 | Wt 212.0 lb

## 2013-11-14 DIAGNOSIS — N529 Male erectile dysfunction, unspecified: Secondary | ICD-10-CM

## 2013-11-14 MED ORDER — TADALAFIL 5 MG PO TABS: 5.0000 mg | ORAL_TABLET | Freq: Every day | ORAL | Status: DC | PRN

## 2013-11-14 NOTE — Patient Instructions (Signed)
Erectile Dysfunction  Erectile dysfunction is the inability to get or sustain a good enough erection to have sexual intercourse. Erectile dysfunction may involve:   Inability to get an erection.   Lack of enough hardness to allow penetration.   Loss of the erection before sex is finished.   Premature ejaculation.  CAUSES   Certain drugs, such as:   Pain relievers.   Antihistamines.   Antidepressants.   Blood pressure medicines.   Water pills (diuretics).   Ulcer medicines.   Muscle relaxants.   Illegal drugs.   Excessive drinking.   Psychological causes, such as:   Anxiety.   Depression.   Sadness.   Exhaustion.   Performance fear.   Stress.   Physical causes, such as:   Artery problems. This may include diabetes, smoking, liver disease, or atherosclerosis.   High blood pressure.   Hormonal problems, such as low testosterone.   Obesity.   Nerve problems. This may include back or pelvic injuries, diabetes mellitus, multiple sclerosis, or Parkinson disease.  SYMPTOMS   Inability to get an erection.   Lack of enough hardness to allow penetration.   Loss of the erection before sex is finished.   Premature ejaculation.   Normal erections at some times, but with frequent unsatisfactory episodes.   Orgasms that are not satisfactory in sensation or frequency.   Low sexual satisfaction in either partner because of erection problems.   A curved penis occurring with erection. The curve may cause pain or may be too curved to allow for intercourse.   Never having nighttime erections.  DIAGNOSIS  Your caregiver can often diagnose this condition by:   Performing a physical exam to find other diseases or specific problems with the penis.   Asking you detailed questions about the problem.   Performing blood tests to check for diabetes mellitus or to measure hormone levels.   Performing urine tests to find other underlying health conditions.   Performing an ultrasound exam to check for  scarring.   Performing a test to check blood flow to the penis.   Doing a sleep study at home to measure nighttime erections.  TREATMENT    You may be prescribed medicines by mouth.   You may be given medicine injections into the penis.   You may be prescribed a vacuum pump with a ring.   Penile implant surgery may be performed. You may receive:   An inflatable implant.   A semirigid implant.   Blood vessel surgery may be performed.  HOME CARE INSTRUCTIONS   If you are prescribed oral medicine, you should take the medicine as prescribed. Do not increase the dosage without first discussing it with your physician.   If you are using self-injections, be careful to avoid any veins that are on the surface of the penis. Apply pressure to the injection site for 5 minutes.   If you are using a vacuum pump, make sure you have read the instructions before using it. Discuss any questions with your physician before taking the pump home.  SEEK MEDICAL CARE IF:   You experience pain that is not responsive to the pain medicine you have been prescribed.   You experience nausea or vomiting.  SEEK IMMEDIATE MEDICAL CARE IF:    When taking oral or injectable medications, you experience an erection that lasts longer than 4 hours. If your physician is unavailable, go to the nearest emergency room for evaluation. An erection that lasts much longer than 4 hours can   result in permanent damage to your penis.   You have pain that is severe.   You develop redness, severe pain, or severe swelling of your penis.   You have redness spreading up into your groin or lower abdomen.   You are unable to pass your urine.  Document Released: 08/30/2000 Document Revised: 05/05/2013 Document Reviewed: 02/04/2013  [employer] Patient Information 2014 [employer].

## 2013-11-14 NOTE — Progress Notes (Signed)
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 He has no fatigue. Lost 75 pounds last couple years by doing a hiking club he just got back from [place] where he successfully climbed in the snow.  Objective: We discussed the possibility of this being a testosterone related condition but patient has no other symptoms of hypogonadism  Assessment: Simple erectile dysfunction  Plan. Erectile dysfunction - Plan: tadalafil (CIALIS) 5 MG tablet  Signed, Robyn Haber, MD

## 2014-02-14 DIAGNOSIS — K5730 Diverticulosis of large intestine without perforation or abscess without bleeding: Secondary | ICD-10-CM | POA: Diagnosis not present

## 2014-02-14 DIAGNOSIS — K648 Other hemorrhoids: Secondary | ICD-10-CM | POA: Diagnosis not present

## 2014-02-14 DIAGNOSIS — Z86010 Personal history of colonic polyps: Secondary | ICD-10-CM | POA: Diagnosis not present

## 2014-02-14 DIAGNOSIS — D126 Benign neoplasm of colon, unspecified: Secondary | ICD-10-CM | POA: Diagnosis not present

## 2014-03-01 DIAGNOSIS — F3162 Bipolar disorder, current episode mixed, moderate: Secondary | ICD-10-CM | POA: Diagnosis not present

## 2014-04-04 ENCOUNTER — Encounter: Payer: Self-pay | Admitting: Internal Medicine

## 2014-04-24 ENCOUNTER — Other Ambulatory Visit: Payer: Self-pay | Admitting: Internal Medicine

## 2014-05-11 ENCOUNTER — Ambulatory Visit (INDEPENDENT_AMBULATORY_CARE_PROVIDER_SITE_OTHER): Payer: Medicare Other | Admitting: Family Medicine

## 2014-05-11 ENCOUNTER — Ambulatory Visit (INDEPENDENT_AMBULATORY_CARE_PROVIDER_SITE_OTHER): Payer: Medicare Other

## 2014-05-11 VITALS — BP 128/82 | HR 71 | Temp 98.1°F | Resp 16 | Ht 71.5 in | Wt 222.4 lb

## 2014-05-11 DIAGNOSIS — R1903 Right lower quadrant abdominal swelling, mass and lump: Secondary | ICD-10-CM | POA: Diagnosis not present

## 2014-05-11 DIAGNOSIS — R300 Dysuria: Secondary | ICD-10-CM

## 2014-05-11 DIAGNOSIS — R972 Elevated prostate specific antigen [PSA]: Secondary | ICD-10-CM

## 2014-05-11 LAB — POCT URINALYSIS DIPSTICK
BILIRUBIN UA: NEGATIVE
Bilirubin, UA: NEGATIVE
Glucose, UA: NEGATIVE
Glucose, UA: NEGATIVE
Ketones, UA: NEGATIVE
Ketones, UA: NEGATIVE
Leukocytes, UA: NEGATIVE
Leukocytes, UA: NEGATIVE
NITRITE UA: NEGATIVE
NITRITE UA: NEGATIVE
Protein, UA: NEGATIVE
Protein, UA: NEGATIVE
RBC UA: NEGATIVE
RBC UA: NEGATIVE
SPEC GRAV UA: 1.01
Spec Grav, UA: 1.01
UROBILINOGEN UA: 0.2
Urobilinogen, UA: 0.2
pH, UA: 7
pH, UA: 7

## 2014-05-11 LAB — POCT UA - MICROSCOPIC ONLY
BACTERIA, U MICROSCOPIC: NEGATIVE
Bacteria, U Microscopic: NEGATIVE
CASTS, UR, LPF, POC: NEGATIVE
Casts, Ur, LPF, POC: NEGATIVE
Crystals, Ur, HPF, POC: NEGATIVE
Crystals, Ur, HPF, POC: NEGATIVE
Epithelial cells, urine per micros: 0.2
MUCUS UA: NEGATIVE
Mucus, UA: NEGATIVE
RBC, urine, microscopic: NEGATIVE
RBC, urine, microscopic: NEGATIVE
WBC, Ur, HPF, POC: NEGATIVE
YEAST UA: NEGATIVE
Yeast, UA: NEGATIVE

## 2014-05-11 LAB — COMPREHENSIVE METABOLIC PANEL
ALBUMIN: 4.4 g/dL (ref 3.5–5.2)
ALT: 20 U/L (ref 0–53)
AST: 19 U/L (ref 0–37)
Alkaline Phosphatase: 52 U/L (ref 39–117)
BUN: 19 mg/dL (ref 6–23)
CALCIUM: 9.4 mg/dL (ref 8.4–10.5)
CHLORIDE: 106 meq/L (ref 96–112)
CO2: 26 mEq/L (ref 19–32)
Creat: 1.33 mg/dL (ref 0.50–1.35)
Glucose, Bld: 94 mg/dL (ref 70–99)
Potassium: 4.5 mEq/L (ref 3.5–5.3)
SODIUM: 140 meq/L (ref 135–145)
Total Bilirubin: 0.6 mg/dL (ref 0.2–1.2)
Total Protein: 6.6 g/dL (ref 6.0–8.3)

## 2014-05-11 NOTE — Patient Instructions (Signed)
We're sending a urine culture, but there is no evidence of infection so far. Having an enlarged prostate can cause your symptoms, and there is other medication to help with that. As long as the urine culture confirms no infection, we can try it (Flomax). In the meantime, we'll get the CT of the abdomen and pelvis to make sure that the mass isn't something we need to be worried about.

## 2014-05-11 NOTE — Progress Notes (Signed)
Subjective:    Patient ID: Christian Parsons, male    DOB: May 11, 1946, 68 y.o.   MRN: [id number]   PCP: Leandrew Koyanagi, MD  Chief Complaint  Patient presents with  . bladder discomfort    x 2 weeks   . Urinary Frequency    Medications, allergies, past medical history, surgical history, family history, social history and problem list reviewed and updated.  Patient Active Problem List   Diagnosis Date Noted  . Benign essential tremor 06/16/2013  . Migraine headache with aura 12/25/2011  . BMI 31.0-31.9,adult 12/25/2011  . Right bundle branch block 12/25/2011  . Bipolar disorder     Prior to Admission medications   Medication Sig Start Date End Date Taking? Authorizing Provider  lamoTRIgine (LAMICTAL) 200 MG tablet Take 200 mg by mouth daily.   Yes Historical Provider, MD  lithium carbonate 300 MG capsule Take 300-600 mg by mouth 2 (two) times daily with a meal. Take 1 capsule (300 MG) in the morning and 2 capsules (600 MG) at night.    Yes Historical Provider, MD  rizatriptan (MAXALT) 10 MG tablet Take 1 tablet by mouth as needed. May repeat in 2 hrs if needed. PATIENT NEEDS OFFICE VISIT FOR ADDITIONAL REFILLS 04/25/14  Yes Leandrew Koyanagi, MD  tadalafil (CIALIS) 5 MG tablet Take 1 tablet (5 mg total) by mouth daily as needed for erectile dysfunction. 11/14/13  Yes Robyn Haber, MD  clonazePAM (KLONOPIN) 0.5 MG tablet Take 0.5 mg by mouth 2 (two) times daily as needed. For anxiety.     Historical Provider, MD  fluticasone (FLONASE) 50 MCG/ACT nasal spray Place 2 sprays into the nose daily. 01/14/12 01/13/13  Posey Boyer, MD    HPI Urinary frequency, burning. Intermittent x several weeks. Similar symptoms in 07/2013-treated for possible prostatitis with symptom resolution. PSA 06/16/13 was mildly elevated, but he did not return for repeat PSA once the urinary symptoms resolved. Mildly elevated serum creatinine was stable in 02/2014.  No fever, chills, N/V, stool  changes.   Review of Systems As above. He also reports that his memory is worse than usual. No weight loss.    Objective:   Physical Exam  Constitutional: He is oriented to person, place, and time. He appears well-developed and well-nourished. No distress.  BP 128/82  Pulse 71  Temp(Src) 98.1 F (36.7 C) (Oral)  Resp 16  Ht 5' 11.5" (1.816 m)  Wt 222 lb 6.4 oz (100.88 kg)  BMI 30.59 kg/m2  SpO2 98%   Eyes: No scleral icterus.  Neck: No thyromegaly present.  Cardiovascular: Normal rate, regular rhythm and normal heart sounds.   Pulmonary/Chest: Effort normal and breath sounds normal.  Abdominal: Soft. Bowel sounds are normal. There is no hepatosplenomegaly. There is generalized tenderness (generalized mild tenderness). There is no rigidity, no guarding, no CVA tenderness, no tenderness at McBurney's point and negative Murphy's sign. A hernia is present. Hernia confirmed positive in the ventral area.    Genitourinary: Rectal exam shows no external hemorrhoid, no internal hemorrhoid, no fissure, no mass, no tenderness and anal tone normal. Prostate is enlarged (Left side > Right side). Prostate is not tender.  Lymphadenopathy:    He has no cervical adenopathy.  Neurological: He is alert and oriented to person, place, and time.  Skin: Skin is warm and dry.  Psychiatric: He has a normal mood and affect. His behavior is normal.   KUB: [facility] reading (PRIMARY) by  Dr. Marin Comment. Large stool burden. Otherwise normal KUB.  Results for orders placed in visit on 05/11/14  POCT UA - MICROSCOPIC ONLY PRE-DRE      Result Value Ref Range   WBC, Ur, HPF, POC neg     RBC, urine, microscopic neg     Bacteria, U Microscopic neg     Mucus, UA neg     Epithelial cells, urine per micros 0-2     Crystals, Ur, HPF, POC neg     Casts, Ur, LPF, POC neg     Yeast, UA neg    POCT URINALYSIS DIPSTICK PRE-DRE      Result Value Ref Range   Color, UA yellow     Clarity, UA clear     Glucose, UA neg      Bilirubin, UA neg     Ketones, UA neg     Spec Grav, UA 1.010     Blood, UA neg     pH, UA 7.0     Protein, UA neg     Urobilinogen, UA 0.2     Nitrite, UA neg     Leukocytes, UA Negative    POCT UA - MICROSCOPIC ONLY POST-DRE      Result Value Ref Range   WBC, Ur, HPF, POC 0-1     RBC, urine, microscopic neg     Bacteria, U Microscopic neg     Mucus, UA neg     Epithelial cells, urine per micros 0.2     Crystals, Ur, HPF, POC neg     Casts, Ur, LPF, POC neg     Yeast, UA neg    POCT URINALYSIS DIPSTICK POST-DRE      Result Value Ref Range   Color, UA yellow     Clarity, UA clear     Glucose, UA neg     Bilirubin, UA neg     Ketones, UA neg     Spec Grav, UA 1.010     Blood, UA neg     pH, UA 7.0     Protein, UA neg     Urobilinogen, UA 0.2     Nitrite, UA neg     Leukocytes, UA Negative           Assessment & Plan:  1. Dysuria Doubt prostatitis, stone.  Possibly BPH, possibly constipation. If UCx confirms no infection, consider Flomax (he notes he had an "unpleasant experience" with Avodart previously).  Increase fluids and dietary fiber, stay physically active. OK to add OTC Miralax. - POCT UA - Microscopic Only - POCT urinalysis dipstick - Urine culture (post-DRE) - POCT UA - Microscopic Only (post-DRE) - POCT urinalysis dipstick (post-DRE)  2. Abdominal mass, RLQ (right lower quadrant) CMET given age and elevated serum Creatinine. - DG Abd 1 View; Future - CT Abdomen Pelvis W Contrast; Future - Comprehensive metabolic panel  3. Elevated PSA Drawn pre-DRE. Await results. - PSA   Fara Chute, PA-C Physician Assistant-Certified [facility]   Patient was  D/w me and examined by me and I Agree with A/P above. Dr Marin Comment

## 2014-05-12 ENCOUNTER — Ambulatory Visit (HOSPITAL_COMMUNITY)
Admission: RE | Admit: 2014-05-12 | Discharge: 2014-05-12 | Disposition: A | Discharge status: Home / Self Care | Payer: Medicare Other | Source: Ambulatory Visit | Attending: Physician Assistant | Admitting: Physician Assistant

## 2014-05-12 ENCOUNTER — Encounter (HOSPITAL_COMMUNITY): Payer: Self-pay

## 2014-05-12 ENCOUNTER — Telehealth: Payer: Self-pay

## 2014-05-12 DIAGNOSIS — N401 Enlarged prostate with lower urinary tract symptoms: Secondary | ICD-10-CM | POA: Diagnosis not present

## 2014-05-12 DIAGNOSIS — D1809 Hemangioma of other sites: Secondary | ICD-10-CM | POA: Diagnosis not present

## 2014-05-12 DIAGNOSIS — R1903 Right lower quadrant abdominal swelling, mass and lump: Secondary | ICD-10-CM

## 2014-05-12 DIAGNOSIS — N3289 Other specified disorders of bladder: Secondary | ICD-10-CM | POA: Diagnosis not present

## 2014-05-12 DIAGNOSIS — N138 Other obstructive and reflux uropathy: Secondary | ICD-10-CM | POA: Insufficient documentation

## 2014-05-12 DIAGNOSIS — R300 Dysuria: Secondary | ICD-10-CM | POA: Diagnosis not present

## 2014-05-12 DIAGNOSIS — K5730 Diverticulosis of large intestine without perforation or abscess without bleeding: Secondary | ICD-10-CM | POA: Insufficient documentation

## 2014-05-12 DIAGNOSIS — R972 Elevated prostate specific antigen [PSA]: Secondary | ICD-10-CM | POA: Insufficient documentation

## 2014-05-12 DIAGNOSIS — N400 Enlarged prostate without lower urinary tract symptoms: Secondary | ICD-10-CM | POA: Diagnosis not present

## 2014-05-12 LAB — PSA: PSA: 3.86 ng/mL (ref <=4.00)

## 2014-05-12 MED ORDER — IOHEXOL 300 MG/ML  SOLN
100.0000 mL | Freq: Once | INTRAMUSCULAR | Status: AC | PRN
  Administered 2014-05-12: 100 mL via INTRAVENOUS

## 2014-05-12 MED ORDER — IOHEXOL 300 MG/ML  SOLN
50.0000 mL | Freq: Once | INTRAMUSCULAR | Status: AC | PRN
  Administered 2014-05-12: 50 mL via ORAL

## 2014-05-12 NOTE — Telephone Encounter (Signed)
Christian Parsons,  Christian Parsons called today requesting his CT results. He just had the scan done today at 1:30pm and he called at 2:40pm for results. I told him the ordering provider must review the results before we can give the results out. I also told him it normally takes a day or two before we call with results of CT scans. He seemed agitated that I was not able to give him his results. I tried to explain the situation the best I could.

## 2014-05-13 LAB — URINE CULTURE
Colony Count: NO GROWTH
Organism ID, Bacteria: NO GROWTH

## 2014-05-14 ENCOUNTER — Ambulatory Visit (INDEPENDENT_AMBULATORY_CARE_PROVIDER_SITE_OTHER): Payer: Medicare Other | Admitting: Internal Medicine

## 2014-05-14 VITALS — BP 130/70 | HR 73 | Temp 97.9°F | Resp 16 | Ht 71.5 in | Wt 218.0 lb

## 2014-05-14 DIAGNOSIS — G43901 Migraine, unspecified, not intractable, with status migrainosus: Secondary | ICD-10-CM | POA: Diagnosis not present

## 2014-05-14 DIAGNOSIS — R1115 Cyclical vomiting syndrome unrelated to migraine: Secondary | ICD-10-CM

## 2014-05-14 DIAGNOSIS — R1110 Vomiting, unspecified: Secondary | ICD-10-CM

## 2014-05-14 LAB — POCT CBC
Granulocyte percent: 90.5 %G — AB (ref 37–80)
HEMATOCRIT: 44.8 % (ref 43.5–53.7)
HEMOGLOBIN: 15.1 g/dL (ref 14.1–18.1)
Lymph, poc: 0.6 (ref 0.6–3.4)
MCH, POC: 32.3 pg — AB (ref 27–31.2)
MCHC: 33.8 g/dL (ref 31.8–35.4)
MCV: 95.6 fL (ref 80–97)
MID (cbc): 0.4 (ref 0–0.9)
MPV: 8.7 fL (ref 0–99.8)
POC Granulocyte: 9 — AB (ref 2–6.9)
POC LYMPH %: 5.6 % — AB (ref 10–50)
POC MID %: 3.9 %M (ref 0–12)
Platelet Count, POC: 146 10*3/uL (ref 142–424)
RBC: 4.69 M/uL (ref 4.69–6.13)
RDW, POC: 12.3 %
WBC: 9.9 10*3/uL (ref 4.6–10.2)

## 2014-05-14 MED ORDER — SODIUM CHLORIDE 0.9 % IV BOLUS (SEPSIS)
1000.0000 mL | Freq: Once | INTRAVENOUS | Status: AC
  Administered 2014-05-14: 1000 mL via INTRAVENOUS

## 2014-05-14 MED ORDER — NALBUPHINE HCL 20 MG/ML IJ SOLN
20.0000 mg | Freq: Once | INTRAMUSCULAR | Status: AC
Start: 2014-05-14 — End: 2014-05-14
  Administered 2014-05-14: 20 mg via INTRAMUSCULAR

## 2014-05-14 MED ORDER — OXYCODONE-ACETAMINOPHEN 10-325 MG PO TABS: 1.0000 | ORAL_TABLET | Freq: Four times a day (QID) | ORAL | Status: DC | PRN

## 2014-05-14 MED ORDER — KETOROLAC TROMETHAMINE 60 MG/2ML IM SOLN
60.0000 mg | Freq: Once | INTRAMUSCULAR | Status: AC
  Administered 2014-05-14: 60 mg via INTRAMUSCULAR

## 2014-05-14 MED ORDER — SODIUM CHLORIDE 0.9 % IV BOLUS (SEPSIS): 1000.0000 mL | Freq: Once | INTRAVENOUS | Status: DC

## 2014-05-14 MED ORDER — ONDANSETRON 4 MG PO TBDP
8.0000 mg | ORAL_TABLET | Freq: Once | ORAL | Status: AC
  Administered 2014-05-14: 8 mg via ORAL

## 2014-05-14 MED ORDER — ONDANSETRON 8 MG PO TBDP: 8.0000 mg | ORAL_TABLET | Freq: Three times a day (TID) | ORAL | Status: DC | PRN

## 2014-05-14 MED ORDER — ACETAMINOPHEN 325 MG PO TABS: 1000.0000 mg | ORAL_TABLET | Freq: Once | ORAL | Status: DC

## 2014-05-14 NOTE — Progress Notes (Addendum)
Subjective:  This chart was scribed for Tami Lin, MD by Donato Schultz, Medical Scribe. This patient was seen in Room 13 and the patient's care was started at 10:00 AM.   Patient ID: Christian Parsons, male    DOB: March 24, 1946, 68 y.o.   MRN: [id number]  HPI HPI Comments: Christian Parsons is a 68 y.o. male who presents to the [facility] complaining of constant migraine located at the front of his head that started yesterday.  He lists blurred vision, dry mouth, nausea, vomiting, and dizziness as associated symptoms.  He denies neck stiffness and fever as associated symptoms.  He has taken Maxalt x 1 with no relief to his symptoms.  He has experienced migraines in the past but never this severe in recent years, and freq diminished in recent yrs.    He states that he was having kidney and prostate issues recently which required him to have a CT scan with contrast on Thursday.  The scan showed that he had an enlarged prostate and bladder and there might be some impediment at the mouth of the bladder. Has been followed for yrs for this problem. There were no signs of cancer in the scan and he suspects he may be having an adverse reaction to the contrast.    He also has Lunesta for insomnia which has not been working for the past week   Patient Active Problem List   Diagnosis Date Noted  . Benign essential tremor 06/16/2013  . Migraine headache with aura 12/25/2011  . BMI 31.0-31.9,adult 12/25/2011  . Right bundle branch block 12/25/2011  . Bipolar disorder    Recently decreasing lithium but not this week  History reviewed. No pertinent past surgical history.   No Known Allergies  Review of Systems  Constitutional: Negative for fever.  Eyes: Positive for visual disturbance.  Gastrointestinal: Positive for nausea and vomiting.  Musculoskeletal: Negative for neck stiffness.  Neurological: Positive for dizziness.     Objective:  Physical Exam  Nursing note and  vitals reviewed. Constitutional: He is oriented to person, place, and time. He appears well-developed and well-nourished. He appears distressed.  Obvious pain/photosens  HENT:  Head: Normocephalic and atraumatic.  Right Ear: External ear normal.  Left Ear: External ear normal.  Nose: Nose normal.  Mouth/Throat: Oropharynx is clear and moist.  Eyes: Conjunctivae and EOM are normal. Pupils are equal, round, and reactive to light.  Neck: Normal range of motion. Neck supple. No thyromegaly present.  Cardiovascular: Normal rate, regular rhythm, normal heart sounds and intact distal pulses.   No murmur heard. Pulmonary/Chest: Effort normal and breath sounds normal.  Neurological: He is alert and oriented to person, place, and time. He has normal reflexes. No cranial nerve deficit. He exhibits normal muscle tone. Coordination normal.  Psychiatric: He has a normal mood and affect. His behavior is normal.   -IV fluids started, Nubain 20 mg, Zofran 8 mg At 45 minutes after first bag infused he still at this dry, mildly nauseated, pain decreased to 5 out 10, still photophobic No new symptoms/no urge to urinate  -Second liter of IV fluids started At one and a half hours post infusion initiation he has developed lightening like sensations in both feet and intermittent times no sensory or motor loss Neurological exam history treated and there are no focal findings that are sensory or motor or balance related His pain is now 2/10/still no urge to urinate/his nausea is resolved/allowed by mouth fluids also  this point Given Toradol 60 and acetaminophen 325x2  -Third liter of IV fluids started nearing completion of this infusion he begins need to void and is able to to the restroom without dizziness IV removed after 3rd liter Pain reduced to 1/10 with no photophobia   BP 130/70  Pulse 73  Temp(Src) 97.9 F (36.6 C) (Oral)  Resp 16  Ht 5' 11.5" (1.816 m)  Wt 218 lb (98.884 kg)  BMI 29.98 kg/m2   SpO2 98% Assessment & Plan:  Problem #1 migraine headache with nausea vomiting and dehydration  Meds ordered this encounter  Medications  . sodium chloride 0.9 % bolus 1,000 mL    Sig:   . ondansetron (ZOFRAN-ODT) disintegrating tablet 8 mg    Sig:   . nalbuphine (NUBAIN) 20 MG/ML injection 20 mg    Sig:   . sodium chloride 0.9 % bolus 1,000 mL    Sig:   . acetaminophen (TYLENOL) tablet 975 mg    Sig:   . ketorolac (TORADOL) injection 60 mg    Sig:   . sodium chloride 0.9 % bolus 1,000 mL    Sig:   . oxyCODONE-acetaminophen (PERCOCET) 10-325 MG per tablet    Sig: Take 1 tablet by mouth every 6 (six) hours as needed for pain.    Dispense:  12 tablet    Refill:  0  . ondansetron (ZOFRAN ODT) 8 MG disintegrating tablet    Sig: Take 1 tablet (8 mg total) by mouth every 8 (eight) hours as needed for nausea or vomiting.    Dispense:  10 tablet    Refill:  2   Home to sleep and headache should resolve overnight or he can followup If he has any exacerbation or any new neurological symptoms he will proceed to the ER at once for acute imaging  I personally performed the services described in this documentation, which was scribed in my presence. The recorded information has been reviewed and is accurate.  Has lunesta for insomnia prescribed by his psychiatrist use Maxalt at the onset if headache relapses  Discharge 2pm

## 2014-05-16 NOTE — Telephone Encounter (Signed)
I sent this patient a My Chart message regarding the results on 05/12/14.

## 2014-05-22 ENCOUNTER — Encounter: Payer: Self-pay | Admitting: Internal Medicine

## 2014-05-25 ENCOUNTER — Ambulatory Visit (INDEPENDENT_AMBULATORY_CARE_PROVIDER_SITE_OTHER): Payer: Medicare Other | Admitting: Internal Medicine

## 2014-05-25 ENCOUNTER — Encounter: Payer: Self-pay | Admitting: Internal Medicine

## 2014-05-25 VITALS — BP 128/78 | HR 68 | Temp 97.9°F | Resp 16 | Ht 72.0 in | Wt 220.0 lb

## 2014-05-25 DIAGNOSIS — Z23 Encounter for immunization: Secondary | ICD-10-CM

## 2014-05-25 DIAGNOSIS — N400 Enlarged prostate without lower urinary tract symptoms: Secondary | ICD-10-CM | POA: Diagnosis not present

## 2014-05-25 DIAGNOSIS — Z79899 Other long term (current) drug therapy: Secondary | ICD-10-CM | POA: Diagnosis not present

## 2014-05-25 DIAGNOSIS — R799 Abnormal finding of blood chemistry, unspecified: Secondary | ICD-10-CM | POA: Diagnosis not present

## 2014-05-25 DIAGNOSIS — R7989 Other specified abnormal findings of blood chemistry: Secondary | ICD-10-CM

## 2014-05-25 LAB — POCT UA - MICROSCOPIC ONLY
BACTERIA, U MICROSCOPIC: NEGATIVE
CASTS, UR, LPF, POC: NEGATIVE
Crystals, Ur, HPF, POC: NEGATIVE
Mucus, UA: NEGATIVE
WBC, Ur, HPF, POC: NEGATIVE
Yeast, UA: POSITIVE

## 2014-05-25 LAB — POCT URINALYSIS DIPSTICK
Bilirubin, UA: NEGATIVE
Blood, UA: NEGATIVE
GLUCOSE UA: NEGATIVE
KETONES UA: NEGATIVE
Leukocytes, UA: NEGATIVE
Nitrite, UA: NEGATIVE
Protein, UA: NEGATIVE
Spec Grav, UA: <=1.005
UROBILINOGEN UA: 0.2
pH, UA: 6

## 2014-05-25 NOTE — Progress Notes (Signed)
Subjective:    Patient ID: Christian Parsons, male    DOB: 1946-04-23, 68 y.o.   MRN: [id number]  This chart was scribed for Leandrew Koyanagi, MD by Edison Simon, ED Scribe. This patient was seen in room 26 and the patient's care was started at 2:24 PM.   HPI  HPI Comments: Christian Parsons is a 68 y.o. male who presents to the [facility] for follow up with particular concern for kidney function. He states his lithium dose is lower than it has been in a long time, as per physician instructions, in response to creatinine levels. He states he no longer has a headache of which he previously complained; he suspects it was due to [place] and states the headaches have resolved since he stopped taking Lunesta. He states he has been doing well lately with respect to his bipolar disorder, correlated with hiking and losing weight, but stretching back before he retired. 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 He reports concern about a recent CT scan that showed a possibility of a problem in his urinary tract and states he may experience decreased flow, though he notes he urinates more freely while hiking and drinking a large amount of fluids.  Patient Active Problem List   Diagnosis Date Noted  . Benign essential tremor 06/16/2013  . Migraine headache with aura 12/25/2011  . BMI 31.0-31.9,adult 12/25/2011  . Right bundle branch block 12/25/2011  . Bipolar disorder    Prior to Admission medications   Medication Sig Start Date End Date Taking? Authorizing Provider  lamoTRIgine (LAMICTAL) 200 MG tablet Take 200 mg by mouth daily.   Yes Historical Provider, MD  lithium carbonate 300 MG capsule Take 300-600 mg by mouth 2 (two) times daily with a meal. Take 1 capsule (300 MG) in the morning and 2 capsules (600 MG) at night.    Yes Historical Provider, MD  rizatriptan (MAXALT) 10 MG tablet Take 1 tablet by mouth as needed. May repeat in 2 hrs  if needed. PATIENT NEEDS OFFICE VISIT FOR ADDITIONAL REFILLS 04/25/14  Yes Leandrew Koyanagi, MD  tadalafil (CIALIS) 5 MG tablet Take 1 tablet (5 mg total) by mouth daily as needed for erectile dysfunction. 11/14/13  Yes Robyn Haber, MD  clonazePAM (KLONOPIN) 0.5 MG tablet Take 0.5 mg by mouth 2 (two) times daily as needed. For anxiety.     Historical Provider, MD  fluticasone (FLONASE) 50 MCG/ACT nasal spray Place 2 sprays into the nose daily. 01/14/12 01/13/13  Posey Boyer, MD  ondansetron (ZOFRAN ODT) 8 MG disintegrating tablet Take 1 tablet (8 mg total) by mouth every 8 (eight) hours as needed for nausea or vomiting. 05/14/14   Leandrew Koyanagi, MD  oxyCODONE-acetaminophen (PERCOCET) 10-325 MG per tablet Take 1 tablet by mouth every 6 (six) hours as needed for pain. 05/14/14   Leandrew Koyanagi, MD    Review of Systems  Neurological: Negative for headaches.  Psychiatric/Behavioral:       Doing well with bipolar disorder      Objective:   Physical Exam  Nursing note and vitals reviewed. Constitutional: He is oriented to person, place, and time. He appears well-developed and well-nourished.  HENT:  Head: Normocephalic and atraumatic.  Eyes: Conjunctivae are normal.  Neck: Normal range of motion. Neck supple.  Pulmonary/Chest: Effort normal.  Musculoskeletal: Normal range of motion.  Neurological: He is alert and oriented to person, place,  and time.  Skin: Skin is warm and dry.  Psychiatric: He has a normal mood and affect.        Assessment & Plan:  Need for prophylactic vaccination and inoculation against influenza - Plan: Flu Vaccine QUAD 36+ mos IM  Lithium use - Plan: Comprehensive metabolic panel, TSH  Abnormal serum creatinine level - Plan: Comprehensive metabolic panel  BPH (benign prostatic hyperplasia) - Plan: POCT UA - Microscopic Only, POCT urinalysis dipstick   Will Send lab results to Dr. Caprice Beaver. LOOKS good  I have completed the patient encounter in its  entirety as documented by the scribe, with editing by me where necessary. Ismerai Bin P. Laney Pastor, M.D.

## 2014-05-26 LAB — COMPREHENSIVE METABOLIC PANEL
ALBUMIN: 4.5 g/dL (ref 3.5–5.2)
ALT: 19 U/L (ref 0–53)
AST: 19 U/L (ref 0–37)
Alkaline Phosphatase: 54 U/L (ref 39–117)
BUN: 20 mg/dL (ref 6–23)
CALCIUM: 9.5 mg/dL (ref 8.4–10.5)
CHLORIDE: 104 meq/L (ref 96–112)
CO2: 24 meq/L (ref 19–32)
Creat: 1.26 mg/dL (ref 0.50–1.35)
Glucose, Bld: 86 mg/dL (ref 70–99)
Potassium: 4.3 mEq/L (ref 3.5–5.3)
Sodium: 135 mEq/L (ref 135–145)
Total Bilirubin: 0.8 mg/dL (ref 0.2–1.2)
Total Protein: 6.9 g/dL (ref 6.0–8.3)

## 2014-05-26 LAB — TSH: TSH: 2.029 u[IU]/mL (ref 0.350–4.500)

## 2014-06-01 ENCOUNTER — Encounter: Payer: Self-pay | Admitting: Internal Medicine

## 2014-07-08 ENCOUNTER — Other Ambulatory Visit: Payer: Self-pay | Admitting: Family Medicine

## 2014-07-11 NOTE — Telephone Encounter (Signed)
Dr Laney Pastor, it looks like you are pt's PCP and have seen pt most recently, so I am forwarding this request to you.

## 2014-07-12 ENCOUNTER — Ambulatory Visit (INDEPENDENT_AMBULATORY_CARE_PROVIDER_SITE_OTHER): Payer: Medicare Other | Admitting: Internal Medicine

## 2014-07-12 ENCOUNTER — Encounter: Payer: Self-pay | Admitting: Internal Medicine

## 2014-07-12 VITALS — BP 130/80 | HR 70 | Temp 98.1°F | Resp 16 | Ht 71.5 in | Wt 228.4 lb

## 2014-07-12 DIAGNOSIS — L259 Unspecified contact dermatitis, unspecified cause: Secondary | ICD-10-CM | POA: Diagnosis not present

## 2014-07-12 MED ORDER — FLUOCINONIDE 0.05 % EX CREA: 1.0000 "application " | TOPICAL_CREAM | Freq: Two times a day (BID) | CUTANEOUS | Status: DC

## 2014-07-12 MED ORDER — CETIRIZINE HCL 10 MG PO TABS: 10.0000 mg | ORAL_TABLET | Freq: Every day | ORAL | Status: DC

## 2014-07-12 NOTE — Progress Notes (Signed)
Subjective:  This chart was scribed for Tami Lin, MD by Randa Evens, ED Scribe. This Patient was seen in room 10 and the patients care was started at 4:54 PM   Patient ID: Christian Parsons, male    DOB: 23-Jun-1946, 68 y.o.   MRN: [id number]  Chief Complaint  Patient presents with  . Rash    Pruritic rash on abdomen, armpits, and back. Started yesterday, worse today   . Nasal Congestion    x2 days  . Cough    Non-productive, x2 days    HPI HPI Comments: Christian Parsons is a 68 y.o. male who presents to the [facility] complaining of rash onset today. He describes the rash as itchy and streaking red. He states that its on his arms, bilateral axillas, torso and the back. He states that he has recently been walking out in the woods when in [place]. He is also concerned about the dust and fumes form a hickory tree that struck his house may be causing the rash as well. He states that since being out of the house for majority of the day he feels as if the rash is improving. He states that he has also been having some rhinorrhea. Pt doesn't report SOB, wheezing, fever or chills.    He states that he is also trying to reduce his medication intake. He states that when he was on the lithium 1200 mg he states that it was making him gain weight. He states  that since being down on 300 mg lithium he started to loose some of the weight but has started to eat more due to his mood being decreased.     Past Medical History  Diagnosis Date  . Coronary artery disease   . Hypertension   . Bipolar disorder     H/O  . Depression   . Anxiety     Prior to Admission medications   Medication Sig Start Date End Date Taking? Authorizing Provider  aspirin 81 MG tablet Take 81 mg by mouth 2 (two) times daily.   Yes Historical Provider, MD  CIALIS 5 MG tablet TAKE 1 TABLET (5 MG TOTAL) BY MOUTH DAILY AS NEEDED FOR ERECTILE DYSFUNCTION. 07/11/14  Yes Leandrew Koyanagi, MD    clonazePAM (KLONOPIN) 0.5 MG tablet Take 0.5 mg by mouth 2 (two) times daily as needed. For anxiety.    Yes Historical Provider, MD  lamoTRIgine (LAMICTAL) 200 MG tablet Take 200 mg by mouth daily.   Yes Historical Provider, MD  lithium carbonate 300 MG capsule Take 300-600 mg by mouth 2 (two) times daily with a meal. Take 1 capsule (300 MG) in the morning and 2 capsules (600 MG) at night.    Yes Historical Provider, MD  rizatriptan (MAXALT) 10 MG tablet Take 1 tablet by mouth as needed. May repeat in 2 hrs if needed. PATIENT NEEDS OFFICE VISIT FOR ADDITIONAL REFILLS 04/25/14  Yes Leandrew Koyanagi, MD  ondansetron (ZOFRAN ODT) 8 MG disintegrating tablet Take 1 tablet (8 mg total) by mouth every 8 (eight) hours as needed for nausea or vomiting. 05/14/14   Leandrew Koyanagi, MD  oxyCODONE-acetaminophen (PERCOCET) 10-325 MG per tablet Take 1 tablet by mouth every 6 (six) hours as needed for pain. 05/14/14   Leandrew Koyanagi, MD   No Known Allergies   Review of Systems  Constitutional: Negative for fever and chills.  HENT: Positive for rhinorrhea.   Respiratory: Negative for shortness of breath  and wheezing.   Skin: Positive for color change and rash.  is weaning lithium and lamictal slowly--but notices slow mood decline and will discuss with dr Tomasita Crumble   Objective:   BP 130/80  Pulse 70  Temp(Src) 98.1 F (36.7 C) (Oral)  Resp 16  Ht 5' 11.5" (1.816 m)  Wt 228 lb 6.4 oz (103.602 kg)  BMI 31.41 kg/m2  SpO2 97%   Physical Exam  Nursing note and vitals reviewed. Constitutional: He is oriented to person, place, and time. He appears well-developed and well-nourished. No distress.  HENT:  Head: Normocephalic and atraumatic.  Eyes: Conjunctivae and EOM are normal.  Neck: Neck supple. No tracheal deviation present.  Cardiovascular: Normal rate.   Pulmonary/Chest: Effort normal. No respiratory distress.  Musculoskeletal: Normal range of motion.  Neurological: He is alert and oriented  to person, place, and time.  Skin: Skin is warm and dry. Rash noted.  Erythematous rash from posterior neck to upper legs with linear streaks of lesions in the axillary areas and abdomen. This rash is papule with fine small papules but no vesicles.   Psychiatric: He has a normal mood and affect. His behavior is normal.     Assessment & Plan:   Allergic rash-contact likely Allergic rhinitis  Call if worse Meds ordered this encounter  Medications  . cetirizine (ZYRTEC) 10 MG tablet    Sig: Take 1 tablet (10 mg total) by mouth daily. May take q 12h if needed for itching    Dispense:  30 tablet    Refill:  3  . fluocinonide cream (LIDEX) 0.05 %    Sig: Apply 1 application topically 2 (two) times daily.    Dispense:  60 g    Refill:  0      I have completed the patient encounter in its entirety as documented by the scribe, with editing by me where necessary. Seeley Southgate P. Laney Christian, M.D.

## 2014-07-13 ENCOUNTER — Other Ambulatory Visit: Payer: Self-pay | Admitting: Internal Medicine

## 2014-07-14 DIAGNOSIS — H04123 Dry eye syndrome of bilateral lacrimal glands: Secondary | ICD-10-CM | POA: Diagnosis not present

## 2014-09-18 ENCOUNTER — Ambulatory Visit (INDEPENDENT_AMBULATORY_CARE_PROVIDER_SITE_OTHER): Payer: Medicare Other

## 2014-09-18 ENCOUNTER — Ambulatory Visit (INDEPENDENT_AMBULATORY_CARE_PROVIDER_SITE_OTHER): Payer: Medicare Other | Admitting: Internal Medicine

## 2014-09-18 VITALS — BP 129/80 | HR 66 | Temp 97.6°F | Resp 18 | Ht 71.5 in | Wt 230.4 lb

## 2014-09-18 DIAGNOSIS — R059 Cough, unspecified: Secondary | ICD-10-CM

## 2014-09-18 DIAGNOSIS — R05 Cough: Secondary | ICD-10-CM | POA: Diagnosis not present

## 2014-09-18 MED ORDER — BENZONATATE 100 MG PO CAPS: 100.0000 mg | ORAL_CAPSULE | Freq: Two times a day (BID) | ORAL | Status: DC | PRN

## 2014-09-18 NOTE — Progress Notes (Signed)
Subjective:    Patient ID: Christian Parsons, male    DOB: 1946/06/18, 69 y.o.   MRN: [id number]  HPI Chief Complaint  Patient presents with  . Cough    x 2 weeks, productive with white gray phlegm  . Nasal Congestion   This chart was scribed for Tami Lin, MD by Thea Alken, ED Scribe. This patient was seen in room 1 and the patient's care was started at 12:10 PM.  HPI Comments: Christian Parsons is a 69 y.o. male who presents to the [facility] complaining of persistent, productive cough that began 2 weeks. Pt reports cough wakes him up during the without sweats. He finds himself SOB with walking occasionally.  Pt denies congestion, postnasal drip, fever, and sore throat. Pt denies GERD with cough.   Past Medical History  Diagnosis Date  . Coronary artery disease   . Hypertension   . Bipolar disorder     H/O  . Depression   . Anxiety    No Known Allergies Prior to Admission medications   Medication Sig Start Date End Date Taking? Authorizing Provider  aspirin 81 MG tablet Take 81 mg by mouth 2 (two) times daily.   Yes Historical Provider, MD  CIALIS 5 MG tablet TAKE 1 TABLET (5 MG TOTAL) BY MOUTH DAILY AS NEEDED FOR ERECTILE DYSFUNCTION. 07/11/14  Yes Leandrew Koyanagi, MD  lamoTRIgine (LAMICTAL) 200 MG tablet Take 200 mg by mouth daily.   Yes Historical Provider, MD  lithium carbonate 300 MG capsule Take 300-600 mg by mouth 2 (two) times daily with a meal. Take 1 capsule (300 MG) in the morning and 2 capsules (600 MG) at night.    Yes Historical Provider, MD  rizatriptan (MAXALT) 10 MG tablet Take 1 tablet (10 mg total) by mouth as needed. May repeat in 2 hrs if needed. 07/14/14  Yes Leandrew Koyanagi, MD  cetirizine (ZYRTEC) 10 MG tablet Take 1 tablet (10 mg total) by mouth daily. May take q 12h if needed for itching Patient not taking: Reported on 09/18/2014 07/12/14   Leandrew Koyanagi, MD  clonazePAM (KLONOPIN) 0.5 MG tablet Take 0.5 mg by mouth 2 (two)  times daily as needed. For anxiety.     Historical Provider, MD  fluocinonide cream (LIDEX) 9.67 % Apply 1 application topically 2 (two) times daily. Patient not taking: Reported on 09/18/2014 07/12/14   Leandrew Koyanagi, MD  ondansetron (ZOFRAN ODT) 8 MG disintegrating tablet Take 1 tablet (8 mg total) by mouth every 8 (eight) hours as needed for nausea or vomiting. Patient not taking: Reported on 09/18/2014 05/14/14   Leandrew Koyanagi, MD  oxyCODONE-acetaminophen (PERCOCET) 10-325 MG per tablet Take 1 tablet by mouth every 6 (six) hours as needed for pain. Patient not taking: Reported on 09/18/2014 05/14/14   Leandrew Koyanagi, MD   Review of Systems  Constitutional: Negative for fever and chills.  HENT: Negative for congestion, postnasal drip and sore throat.   Respiratory: Positive for shortness of breath.    Objective:   Physical Exam  Constitutional: He is oriented to person, place, and time. He appears well-developed and well-nourished. No distress.  HENT:  Head: Normocephalic and atraumatic.  Right Ear: External ear normal.  Left Ear: External ear normal.  Nose: Nose normal.  Mouth/Throat: Oropharynx is clear and moist.  Eyes: Conjunctivae and EOM are normal. Pupils are equal, round, and reactive to light.  Neck: Neck supple. No thyromegaly present.  Cardiovascular:  Normal rate, normal heart sounds and intact distal pulses.  Exam reveals no gallop and no friction rub.   No murmur heard. Pulmonary/Chest: Effort normal and breath sounds normal. He has no wheezes.  Musculoskeletal: Normal range of motion.  Lymphadenopathy:    He has no cervical adenopathy.  Neurological: He is alert and oriented to person, place, and time.  Skin: Skin is warm and dry.  Psychiatric: He has a normal mood and affect. His behavior is normal.  Nursing note and vitals reviewed.  Filed Vitals:   09/18/14 1149  BP: 129/80  Pulse: 66  Temp: 97.6 F (36.4 C)  Resp: 18   .[facility] reading (PRIMARY) by  Dr. Laney Pastor. CXR ? Mild right middle lobe infiltrate versus normal  Assessment & Plan:  Cough - viral illness likely Tessalon Perles Follow-up with fever   I personally performed the services described in this documentation, which was scribed in my presence. The recorded information has been reviewed and is accurate.

## 2014-10-02 ENCOUNTER — Encounter: Payer: Self-pay | Admitting: Family Medicine

## 2014-10-02 ENCOUNTER — Ambulatory Visit (INDEPENDENT_AMBULATORY_CARE_PROVIDER_SITE_OTHER): Payer: Medicare Other | Admitting: Family Medicine

## 2014-10-02 VITALS — BP 142/88 | HR 69 | Temp 98.3°F | Resp 18 | Ht 72.0 in | Wt 227.8 lb

## 2014-10-02 DIAGNOSIS — J209 Acute bronchitis, unspecified: Secondary | ICD-10-CM | POA: Diagnosis not present

## 2014-10-02 MED ORDER — HYDROCODONE-HOMATROPINE 5-1.5 MG/5ML PO SYRP: 5.0000 mL | ORAL_SOLUTION | Freq: Three times a day (TID) | ORAL | Status: DC | PRN

## 2014-10-02 MED ORDER — AZITHROMYCIN 250 MG PO TABS: ORAL_TABLET | ORAL | Status: DC

## 2014-10-02 NOTE — Patient Instructions (Signed)

## 2014-10-02 NOTE — Progress Notes (Signed)
Subjective:  This chart was scribed for Robyn Haber MD, by Tamsen Roers, at [facility].  This patient was seen in room 9 and the patient's care was started at 12:39 PM.    Patient ID: Christian Parsons, male    DOB: [birth year], 69 y.o.   MRN: [id number]  HPI  HPI Comments: Christian Parsons is a 69 y.o. male who presents to [facility] for a cough with white sputum which has not changed for the past two weeks since his last visit.  He notes that he has been having some chills at night (which is a new symptom).  Notes he was in here two weeks ago with Dr. Laney Pastor who wanted him to wait to see if it was bacterial or viral. Patient states he does some walking.  Denies any chest pain. Notes that he has not been this sick since 6 or 7 years.  Patient used tessalon pearls which minimally helped him.   Patient is a Music therapist.     Patient Active Problem List   Diagnosis Date Noted   Benign essential tremor 06/16/2013   Migraine headache with aura 12/25/2011   BMI 31.0-31.9,adult 12/25/2011   Right bundle branch block 12/25/2011   Bipolar disorder    Past Medical History  Diagnosis Date   Coronary artery disease    Hypertension    Bipolar disorder     H/O   Depression    Anxiety    History reviewed. No pertinent past surgical history. No Known Allergies Prior to Admission medications   Medication Sig Start Date End Date Taking? Authorizing Provider  aspirin 81 MG tablet Take 81 mg by mouth 2 (two) times daily.   Yes Historical Provider, MD  CIALIS 5 MG tablet TAKE 1 TABLET (5 MG TOTAL) BY MOUTH DAILY AS NEEDED FOR ERECTILE DYSFUNCTION. 07/11/14  Yes Leandrew Koyanagi, MD  fluocinonide cream (LIDEX) 2.77 % Apply 1 application topically 2 (two) times daily. 07/12/14  Yes Leandrew Koyanagi, MD  lamoTRIgine (LAMICTAL) 200 MG tablet Take 200 mg by mouth daily.   Yes Historical Provider, MD  lithium carbonate 300 MG capsule Take  300-600 mg by mouth 2 (two) times daily with a meal. Take 1 capsule (300 MG) in the morning and 2 capsules (600 MG) at night.    Yes Historical Provider, MD  rizatriptan (MAXALT) 10 MG tablet Take 1 tablet (10 mg total) by mouth as needed. May repeat in 2 hrs if needed. 07/14/14  Yes Leandrew Koyanagi, MD  clonazePAM (KLONOPIN) 0.5 MG tablet Take 0.5 mg by mouth 2 (two) times daily as needed. For anxiety.     Historical Provider, MD   History   Social History   Marital Status: Married    Spouse Name: N/A    Number of Children: 1   Years of Education: 16+   Occupational History   retired     Music therapist   Social History Main Topics   Smoking status: Never Smoker    Smokeless tobacco: Never Used   Alcohol Use: Yes     Comment: 12 drinks/week   Drug Use: No   Sexual Activity: Not on file   Other Topics Concern   Not on file   Social History Narrative   Exercise: walks 4-5 days per week also hikes 4 days per week 1-7 hours per day.      Review of Systems  Constitutional: Positive for chills.  Respiratory: Positive for cough. Negative for chest tightness.   Cardiovascular: Negative for chest pain.   patient's is been down the dumps for the last couple months but he does have plans to go to [facility] in the next year.     Objective:   Physical Exam Patient is alert, well educated older gentleman in no acute distress HEENT: Unremarkable Neck: Supple no adenopathy Chest: Few extra rhonchi Heart: Regular no murmur Skin: Warm and dry Neurological: Patient is able to smile and converse easily, he does have a parkinsonian tremor which is mild    Filed Vitals:   10/02/14 1221  BP: 142/88  Pulse: 69  Temp: 98.3 F (36.8 C)  TempSrc: Oral  Resp: 18  Height: 6' (1.829 m)  Weight: 227 lb 12.8 oz (103.329 kg)  SpO2: 97%         Assessment & Plan:    This chart was scribed in my presence and reviewed by me personally.    ICD-9-CM ICD-10-CM     1. Acute bronchitis, unspecified organism 466.0 J20.9 azithromycin (ZITHROMAX Z-PAK) 250 MG tablet     HYDROcodone-homatropine (HYCODAN) 5-1.5 MG/5ML syrup     Signed, Robyn Haber, MD

## 2014-10-22 ENCOUNTER — Ambulatory Visit (INDEPENDENT_AMBULATORY_CARE_PROVIDER_SITE_OTHER): Payer: Medicare Other | Admitting: Internal Medicine

## 2014-10-22 VITALS — BP 110/70 | HR 63 | Temp 98.5°F | Resp 16 | Ht 72.0 in | Wt 231.0 lb

## 2014-10-22 DIAGNOSIS — K469 Unspecified abdominal hernia without obstruction or gangrene: Secondary | ICD-10-CM

## 2014-10-22 NOTE — Progress Notes (Signed)
Subjective:  This chart was scribed for Tami Lin, MD by Dellis Filbert, ED Scribe at [facility].The patient was seen in exam room 02 and the patient's care was started at 10:26 AM.   Patient ID: Christian Parsons, male    DOB: 1946-02-12, 69 y.o.   MRN: [id number] Chief Complaint  Patient presents with  . Hernia    Referral for surgery   HPI HPI Comments: Christian Parsons is a 69 y.o. male who presents to [facility] for surgical referral and a second opinion regarding the need for a midline abdominal hernia repair. He has had this for several years and past surgical opinion has been to wait on any surgery until it begins to interfere with his lifestyle. 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 Now, after 3 miles of a 7 mile hike he has significant abdominal pain. He cannot hike nearly to the level he has able to in the past. Pt has noticed he has gained some weight due to the inability to sustain exercise.   Patient Active Problem List   Diagnosis Date Noted  . Benign essential tremor 06/16/2013  . Migraine headache with aura 12/25/2011  . BMI 31.0-31.9,adult 12/25/2011  . Right bundle branch block 12/25/2011  . Bipolar disorder-----stable for many years care for by psychiatry      History reviewed. No pertinent past surgical history. No Known Allergies Prior to Admission medications   Medication Sig Start Date End Date Taking? Authorizing Provider  aspirin 81 MG tablet Take 81 mg by mouth 2 (two) times daily.   Yes Historical Provider, MD  CIALIS 5 MG tablet TAKE 1 TABLET (5 MG TOTAL) BY MOUTH DAILY AS NEEDED FOR ERECTILE DYSFUNCTION. 07/11/14  Yes Leandrew Koyanagi, MD  lamoTRIgine (LAMICTAL) 200 MG tablet Take 200 mg by mouth daily.   Yes Historical Provider, MD  lithium carbonate 300 MG capsule Take 300-600 mg by mouth 2 (two) times daily with a meal. Take 1 capsule (300 MG) in the morning and 2 capsules (600 MG) at night.     Yes Historical Provider, MD  rizatriptan (MAXALT) 10 MG tablet Take 1 tablet (10 mg total) by mouth as needed. May repeat in 2 hrs if needed. 07/14/14  Yes Leandrew Koyanagi, MD  clonazePAM (KLONOPIN) 0.5 MG tablet Take 0.5 mg by mouth 2 (two) times daily as needed. For anxiety.     Historical Provider, MD  fluocinonide cream (LIDEX) 9.24 % Apply 1 application topically 2 (two) times daily. Patient not taking: Reported on 10/22/2014 07/12/14   Leandrew Koyanagi, MD    Review of Systems  Constitutional: Positive for activity change.  Gastrointestinal: Positive for abdominal pain.      Objective:  BP 110/70 mmHg  Pulse 63  Temp(Src) 98.5 F (36.9 C) (Oral)  Resp 16  Ht 6' (1.829 m)  Wt 231 lb (104.781 kg)  BMI 31.32 kg/m2  SpO2 98%  Physical Exam  Constitutional: He is oriented to person, place, and time. He appears well-developed and well-nourished. No distress.  HENT:  Head: Normocephalic and atraumatic.  Eyes: Pupils are equal, round, and reactive to light.  Neck: Normal range of motion.  Cardiovascular: Normal rate and regular rhythm.   Pulmonary/Chest: Effort normal. No respiratory distress.  Abdominal:  Obvious midline abdominal hernia that is TTP but not incarcerated.  Musculoskeletal: Normal range of motion.  Neurological: He is alert and oriented to  person, place, and time.  Skin: Skin is warm and dry.  Psychiatric: He has a normal mood and affect. His behavior is normal.  Nursing note and vitals reviewed.      Assessment & Plan:  Midline abdominal hernia  Refer to surgery--- he would like to see Dr. Lucia Gaskins or Dr. Zella Richer  I have completed the patient encounter in its entirety as documented by the scribe, with editing by me where necessary. Ashaki Frosch P. Laney Pastor, M.D.

## 2014-10-25 ENCOUNTER — Encounter: Payer: Self-pay | Admitting: Internal Medicine

## 2014-11-11 ENCOUNTER — Ambulatory Visit (INDEPENDENT_AMBULATORY_CARE_PROVIDER_SITE_OTHER): Payer: Self-pay | Admitting: Surgery

## 2014-11-11 ENCOUNTER — Encounter: Payer: Self-pay | Admitting: Internal Medicine

## 2014-11-11 DIAGNOSIS — K429 Umbilical hernia without obstruction or gangrene: Secondary | ICD-10-CM | POA: Diagnosis not present

## 2014-11-11 DIAGNOSIS — M6208 Separation of muscle (nontraumatic), other site: Secondary | ICD-10-CM | POA: Diagnosis not present

## 2014-11-11 NOTE — H&P (Signed)
Christian Parsons 11/11/2014 9:47 AM Location: [facility] Patient #: [id number] DOB: 02-Aug-1946 Married / Language: Undefined / Race: Undefined Male History of Present Illness Christian Moores A. Markies Mowatt MD; 11/11/2014 12:49 PM) Patient words: hernia  PT complains of periumbilical pain when hikink over last 6 months. Stamina less as well. Sent at the request of Dr Laney Pastor for umbilical hernia. Pt reports bulge in upper abdomen as well. Been present for many months. Hurts to hike with backpack.  The patient is a 69 year old male   Other Problems Christian Parsons; 02/20/3015 0:10 AM) Anxiety Disorder Bladder Problems Migraine Headache Umbilical Hernia Repair Ventral Hernia Repair  Past Surgical History Christian Parsons; 9/32/3557 3:22 AM) Colon Polyp Removal - Colonoscopy  Diagnostic Studies History Christian Parsons; 0/25/4270 6:23 AM) Colonoscopy within last year  Allergies Christian Parsons; 7/62/8315 1:76 AM) No Known Drug Allergies 11/11/2014  Medication History Christian Parsons; 1/60/7371 0:62 AM) Cialis (5MG  Tablet, Oral) Active. Fluocinonide (0.05% Cream, External) Active. LamoTRIgine (200MG  Tablet, Oral) Active. Lithium Carbonate (300MG  Capsule, Oral) Active. Rizatriptan Benzoate (10MG  Tablet, Oral) Active. Aspirin EC (81MG  Tablet DR, Oral) Active. Medications Reconciled  Social History Christian Parsons; 6/94/8546 2:70 AM) Alcohol use Moderate alcohol use. Caffeine use Carbonated beverages, Coffee, Tea. Illicit drug use Remotely quit drug use. Tobacco use Never smoker.  Family History Christian Parsons; 3/50/0938 1:82 AM) Breast Cancer Mother. Colon Polyps Father. Migraine Headache Daughter, Sister.     Review of Systems Christian Parsons; 9/93/7169 6:78 AM) General Present- Fatigue and Weight Gain. Not Present- Appetite Loss, Chills, Fever, Night Sweats and Weight Loss. Skin Not Present- Change in Wart/Mole,  Dryness, Hives, Jaundice, New Lesions, Non-Healing Wounds, Rash and Ulcer. HEENT Present- Wears glasses/contact lenses. Not Present- Earache, Hearing Loss, Hoarseness, Nose Bleed, Oral Ulcers, Ringing in the Ears, Seasonal Allergies, Sinus Pain, Sore Throat, Visual Disturbances and Yellow Eyes. Respiratory Not Present- Bloody sputum, Chronic Cough, Difficulty Breathing, Snoring and Wheezing. Breast Not Present- Breast Mass, Breast Pain, Nipple Discharge and Skin Changes. Cardiovascular Not Present- Chest Pain, Difficulty Breathing Lying Down, Leg Cramps, Palpitations, Rapid Heart Rate, Shortness of Breath and Swelling of Extremities. Gastrointestinal Present- Abdominal Pain. Not Present- Bloating, Bloody Stool, Change in Bowel Habits, Chronic diarrhea, Constipation, Difficulty Swallowing, Excessive gas, Gets full quickly at meals, Hemorrhoids, Indigestion, Nausea, Rectal Pain and Vomiting. Musculoskeletal Not Present- Back Pain, Joint Pain, Joint Stiffness, Muscle Pain, Muscle Weakness and Swelling of Extremities. Neurological Present- Decreased Memory. Not Present- Fainting, Headaches, Numbness, Seizures, Tingling, Tremor, Trouble walking and Weakness. Psychiatric Present- Bipolar. Not Present- Anxiety, Change in Sleep Pattern, Depression, Fearful and Frequent crying. Endocrine Not Present- Cold Intolerance, Excessive Hunger, Hair Changes, Heat Intolerance, Hot flashes and New Diabetes. Hematology Not Present- Easy Bruising, Excessive bleeding, Gland problems, HIV and Persistent Infections.  Vitals Christian Parsons; 9/38/1017 5:10 AM) 11/11/2014 9:49 AM Weight: 238.5 lb Height: 72in Body Surface Area: 2.34 m Body Mass Index: 32.35 kg/m Temp.: 97.86F  Pulse: 64 (Regular)  BP: 146/88 (Sitting, Left Arm, Standard)     Physical Exam (Christian Calma A. Garhett Bernhard MD; 11/11/2014 12:50 PM)  General Mental Status-Alert. General Appearance-Consistent with stated age. Hydration-Well  hydrated. Voice-Normal.  Eye Eyeball - Bilateral-Extraocular movements intact. Sclera/Conjunctiva - Bilateral-No scleral icterus.  Chest and Lung Exam Chest and lung exam reveals -quiet, even and easy respiratory effort with no use of accessory muscles and on auscultation, normal breath sounds, no adventitious sounds and normal vocal resonance. Inspection Chest Wall - Normal. Back -  normal.  Cardiovascular Cardiovascular examination reveals -normal heart sounds, regular rate and rhythm with no murmurs and normal pedal pulses bilaterally.  Abdomen Note: diastasis recti small umbilical hernia reducible.    Neurologic Neurologic evaluation reveals -alert and oriented x 3 with no impairment of recent or remote memory. Mental Status-Normal.  Musculoskeletal Normal Exam - Left-Upper Extremity Strength Normal and Lower Extremity Strength Normal. Normal Exam - Right-Upper Extremity Strength Normal, Lower Extremity Weakness.    Assessment & Plan (Christian Wigger A. Kumar Falwell MD; 03/27/1974 88:32 PM)  UMBILICAL HERNIA WITHOUT OBSTRUCTION AND WITHOUT GANGRENE (553.1  K42.9) Impression: diastasis would need to be evaluated by plastics. Discussed heria and diastasis conditions and treatment options. He would like to repair his umbilical hernia. The risk of hernia repair include bleeding, infection, organ injury, bowel injury, bladder injury, nerve injury recurrent hernia, blood clots, worsening of underlying condition, chronic pain, mesh use, open surgery, death, and the need for other operattions. Pt agrees to proceed I explained this may or may not help his hiking.  Current Plans Pt Education - [facility] Umbilical/ Inguinal Hernia HCI Pt Education - [facility] Wound Care Instructions (Gross): discussed with patient and provided information. Pt Education - [facility] Hernia Post-Op HCI (Gross): discussed with patient and provided information. DIASTASIS RECTI (728.84  M62.08)

## 2014-11-19 ENCOUNTER — Ambulatory Visit (INDEPENDENT_AMBULATORY_CARE_PROVIDER_SITE_OTHER): Payer: Medicare Other | Admitting: Internal Medicine

## 2014-11-19 VITALS — BP 130/80 | HR 67 | Temp 97.5°F | Ht 71.5 in | Wt 232.2 lb

## 2014-11-19 DIAGNOSIS — K429 Umbilical hernia without obstruction or gangrene: Secondary | ICD-10-CM

## 2014-11-21 NOTE — Progress Notes (Signed)
   Subjective:    Patient ID: Christian Parsons, male    DOB: 1945-10-22, 69 y.o.   MRN: [id number]  HPI He returns for further discussion of his midline abdominal hernia. He was seen by Dr. Brantley Stage and they were planning repair of his small umbilical hernia but not his diastasis. It is of course unclear which is bothering him the most. It is interfering with his lifestyle in a big way. He has tried binding his stomach for hiking but this is not completely successful.  Unfortunately [name] has not been able to get a response by phone from the surgical office over the past 2 weeks and he has become discouraged and would like a second opinion at [facility].   Review of Systems There are no changes here    Objective:   Physical Exam BP 130/80 mmHg  Pulse 67  Temp(Src) 97.5 F (36.4 C) (Oral)  Ht 5' 11.5" (1.816 m)  Wt 232 lb 4 oz (105.348 kg)  BMI 31.94 kg/m2  SpO2 99% Exam of his abdomen remains unchanged       Assessment & Plan:  Umbilical hernia with pain  [facility]

## 2014-11-23 ENCOUNTER — Encounter: Payer: Self-pay | Admitting: Internal Medicine

## 2014-12-22 ENCOUNTER — Encounter: Payer: Self-pay | Admitting: Internal Medicine

## 2014-12-22 DIAGNOSIS — M6208 Separation of muscle (nontraumatic), other site: Secondary | ICD-10-CM | POA: Diagnosis not present

## 2014-12-22 DIAGNOSIS — K429 Umbilical hernia without obstruction or gangrene: Secondary | ICD-10-CM | POA: Diagnosis not present

## 2014-12-22 DIAGNOSIS — N529 Male erectile dysfunction, unspecified: Secondary | ICD-10-CM | POA: Insufficient documentation

## 2015-01-13 DIAGNOSIS — K429 Umbilical hernia without obstruction or gangrene: Secondary | ICD-10-CM | POA: Diagnosis not present

## 2015-01-13 DIAGNOSIS — M6208 Separation of muscle (nontraumatic), other site: Secondary | ICD-10-CM | POA: Diagnosis not present

## 2015-01-20 ENCOUNTER — Encounter: Payer: Self-pay | Admitting: Internal Medicine

## 2015-02-17 DIAGNOSIS — K439 Ventral hernia without obstruction or gangrene: Secondary | ICD-10-CM | POA: Insufficient documentation

## 2015-02-17 DIAGNOSIS — R339 Retention of urine, unspecified: Secondary | ICD-10-CM | POA: Diagnosis not present

## 2015-02-17 DIAGNOSIS — N400 Enlarged prostate without lower urinary tract symptoms: Secondary | ICD-10-CM | POA: Diagnosis not present

## 2015-02-17 DIAGNOSIS — F3181 Bipolar II disorder: Secondary | ICD-10-CM | POA: Diagnosis not present

## 2015-02-17 DIAGNOSIS — K429 Umbilical hernia without obstruction or gangrene: Secondary | ICD-10-CM | POA: Diagnosis not present

## 2015-02-17 DIAGNOSIS — Z7982 Long term (current) use of aspirin: Secondary | ICD-10-CM | POA: Diagnosis not present

## 2015-02-18 ENCOUNTER — Encounter: Payer: Self-pay | Admitting: Internal Medicine

## 2015-02-18 DIAGNOSIS — R339 Retention of urine, unspecified: Secondary | ICD-10-CM | POA: Diagnosis not present

## 2015-02-18 DIAGNOSIS — F3181 Bipolar II disorder: Secondary | ICD-10-CM | POA: Diagnosis not present

## 2015-02-18 DIAGNOSIS — N400 Enlarged prostate without lower urinary tract symptoms: Secondary | ICD-10-CM | POA: Diagnosis not present

## 2015-02-18 DIAGNOSIS — Z7982 Long term (current) use of aspirin: Secondary | ICD-10-CM | POA: Diagnosis not present

## 2015-02-18 DIAGNOSIS — K429 Umbilical hernia without obstruction or gangrene: Secondary | ICD-10-CM | POA: Diagnosis not present

## 2015-02-21 MED ORDER — TAMSULOSIN HCL 0.4 MG PO CAPS: 0.4000 mg | ORAL_CAPSULE | Freq: Every day | ORAL | Status: DC

## 2015-02-22 DIAGNOSIS — R339 Retention of urine, unspecified: Secondary | ICD-10-CM | POA: Diagnosis not present

## 2015-03-10 DIAGNOSIS — N183 Chronic kidney disease, stage 3 (moderate): Secondary | ICD-10-CM | POA: Diagnosis not present

## 2015-03-10 DIAGNOSIS — R1030 Lower abdominal pain, unspecified: Secondary | ICD-10-CM | POA: Diagnosis not present

## 2015-03-10 DIAGNOSIS — N1330 Unspecified hydronephrosis: Secondary | ICD-10-CM | POA: Diagnosis not present

## 2015-03-10 DIAGNOSIS — E232 Diabetes insipidus: Secondary | ICD-10-CM | POA: Diagnosis not present

## 2015-03-10 DIAGNOSIS — F3181 Bipolar II disorder: Secondary | ICD-10-CM | POA: Diagnosis not present

## 2015-03-10 DIAGNOSIS — N390 Urinary tract infection, site not specified: Secondary | ICD-10-CM | POA: Diagnosis not present

## 2015-03-10 DIAGNOSIS — R339 Retention of urine, unspecified: Secondary | ICD-10-CM | POA: Diagnosis not present

## 2015-03-10 DIAGNOSIS — N1339 Other hydronephrosis: Secondary | ICD-10-CM | POA: Diagnosis not present

## 2015-03-10 DIAGNOSIS — N401 Enlarged prostate with lower urinary tract symptoms: Secondary | ICD-10-CM | POA: Diagnosis not present

## 2015-03-10 DIAGNOSIS — N10 Acute tubulo-interstitial nephritis: Secondary | ICD-10-CM | POA: Diagnosis not present

## 2015-03-11 DIAGNOSIS — R51 Headache: Secondary | ICD-10-CM | POA: Diagnosis not present

## 2015-03-11 DIAGNOSIS — R338 Other retention of urine: Secondary | ICD-10-CM | POA: Diagnosis not present

## 2015-03-11 DIAGNOSIS — N400 Enlarged prostate without lower urinary tract symptoms: Secondary | ICD-10-CM | POA: Diagnosis not present

## 2015-03-11 DIAGNOSIS — N1339 Other hydronephrosis: Secondary | ICD-10-CM | POA: Diagnosis not present

## 2015-03-11 DIAGNOSIS — N529 Male erectile dysfunction, unspecified: Secondary | ICD-10-CM | POA: Diagnosis present

## 2015-03-11 DIAGNOSIS — N390 Urinary tract infection, site not specified: Secondary | ICD-10-CM | POA: Diagnosis not present

## 2015-03-11 DIAGNOSIS — N179 Acute kidney failure, unspecified: Secondary | ICD-10-CM | POA: Diagnosis not present

## 2015-03-11 DIAGNOSIS — N1330 Unspecified hydronephrosis: Secondary | ICD-10-CM | POA: Diagnosis present

## 2015-03-11 DIAGNOSIS — R1030 Lower abdominal pain, unspecified: Secondary | ICD-10-CM | POA: Diagnosis not present

## 2015-03-11 DIAGNOSIS — T43595A Adverse effect of other antipsychotics and neuroleptics, initial encounter: Secondary | ICD-10-CM | POA: Diagnosis present

## 2015-03-11 DIAGNOSIS — N401 Enlarged prostate with lower urinary tract symptoms: Secondary | ICD-10-CM | POA: Diagnosis not present

## 2015-03-11 DIAGNOSIS — N10 Acute tubulo-interstitial nephritis: Secondary | ICD-10-CM | POA: Diagnosis present

## 2015-03-11 DIAGNOSIS — Z79899 Other long term (current) drug therapy: Secondary | ICD-10-CM | POA: Diagnosis not present

## 2015-03-11 DIAGNOSIS — Z7982 Long term (current) use of aspirin: Secondary | ICD-10-CM | POA: Diagnosis not present

## 2015-03-11 DIAGNOSIS — N142 Nephropathy induced by unspecified drug, medicament or biological substance: Secondary | ICD-10-CM | POA: Diagnosis not present

## 2015-03-11 DIAGNOSIS — N12 Tubulo-interstitial nephritis, not specified as acute or chronic: Secondary | ICD-10-CM | POA: Diagnosis not present

## 2015-03-11 DIAGNOSIS — R339 Retention of urine, unspecified: Secondary | ICD-10-CM | POA: Diagnosis not present

## 2015-03-11 DIAGNOSIS — D291 Benign neoplasm of prostate: Secondary | ICD-10-CM | POA: Diagnosis not present

## 2015-03-11 DIAGNOSIS — N183 Chronic kidney disease, stage 3 (moderate): Secondary | ICD-10-CM | POA: Diagnosis present

## 2015-03-11 DIAGNOSIS — F3181 Bipolar II disorder: Secondary | ICD-10-CM | POA: Diagnosis present

## 2015-03-11 DIAGNOSIS — R112 Nausea with vomiting, unspecified: Secondary | ICD-10-CM | POA: Diagnosis not present

## 2015-03-11 DIAGNOSIS — E232 Diabetes insipidus: Secondary | ICD-10-CM | POA: Diagnosis not present

## 2015-03-11 DIAGNOSIS — B9689 Other specified bacterial agents as the cause of diseases classified elsewhere: Secondary | ICD-10-CM | POA: Diagnosis present

## 2015-03-11 DIAGNOSIS — F319 Bipolar disorder, unspecified: Secondary | ICD-10-CM | POA: Diagnosis not present

## 2015-03-16 DIAGNOSIS — N059 Unspecified nephritic syndrome with unspecified morphologic changes: Secondary | ICD-10-CM | POA: Diagnosis not present

## 2015-03-16 DIAGNOSIS — T56891A Toxic effect of other metals, accidental (unintentional), initial encounter: Secondary | ICD-10-CM

## 2015-03-16 DIAGNOSIS — N141 Nephropathy induced by other drugs, medicaments and biological substances: Secondary | ICD-10-CM | POA: Insufficient documentation

## 2015-03-17 ENCOUNTER — Ambulatory Visit (INDEPENDENT_AMBULATORY_CARE_PROVIDER_SITE_OTHER): Payer: Medicare Other | Admitting: Internal Medicine

## 2015-03-17 VITALS — BP 124/72 | HR 71 | Temp 97.9°F | Resp 18 | Ht 72.0 in | Wt 222.0 lb

## 2015-03-17 DIAGNOSIS — F3170 Bipolar disorder, currently in remission, most recent episode unspecified: Secondary | ICD-10-CM | POA: Diagnosis not present

## 2015-03-17 DIAGNOSIS — R748 Abnormal levels of other serum enzymes: Secondary | ICD-10-CM

## 2015-03-17 DIAGNOSIS — N429 Disorder of prostate, unspecified: Secondary | ICD-10-CM

## 2015-03-17 DIAGNOSIS — Z79899 Other long term (current) drug therapy: Secondary | ICD-10-CM

## 2015-03-17 DIAGNOSIS — R7989 Other specified abnormal findings of blood chemistry: Secondary | ICD-10-CM

## 2015-03-17 NOTE — Progress Notes (Addendum)
Subjective:  This chart was scribed for Tami Lin, MD by Moises Blood, Medical Scribe. This patient was seen in Room 7 and the patient's care was started at 12:32 PM.     Patient ID: Christian Parsons, male    DOB: [birth year], 69 y.o.   MRN: [id number]  HPI Christian Parsons is a 69 y.o. male who presents to [facility] for hospitalization follow up. He had his surgery for his hernia done on June 3rd. Everything went well until he went home and had trouble urinating. Diagnosed with prostate hypertrophy and currently using a Foley. In the process of doing self-catheterization he developed acute pyelonephritis and had to be rehospitalized IV antibiotics. He has some tremors and does not believe this should be done by himself. He has surgery planned next Thursday at [facility]=TURP.   His creatinine levels were measured during hospitalization and he was diagnosed with chronic kidney disease secondary to lithium use But over time in the hospital, it went down. He had it rechecked 2 days ago= below 1.5. In retrospect he responded to IV fluids and IV anabiotic's and this may have represented an acute renal insult from dehydration and pyelonephritis. He would prefer not to change lithium without significant discussion with psychiatry Dr. Caprice Beaver  He requests some more information about bipolar disorder.    Patient Active Problem List   Diagnosis Date Noted  . Benign essential tremor 06/16/2013  . Migraine headache with aura 12/25/2011  . BMI 31.0-31.9,adult 12/25/2011  . Right bundle branch block 12/25/2011  . Bipolar disorder     Current outpatient prescriptions:  .  ciprofloxacin (CIPRO) 500 MG tablet, Take 500 mg by mouth 2 (two) times daily., Disp: , Rfl:  .  finasteride (PROSCAR) 5 MG tablet, Take 5 mg by mouth daily., Disp: , Rfl:  .  lamoTRIgine (LAMICTAL) 200 MG tablet, Take 200 mg by mouth daily., Disp: , Rfl:  .  lithium carbonate 300 MG capsule, Take 300-600 mg by mouth 2 (two) times  daily with a meal. Take 1 capsule (300 MG) in the morning and 2 capsules (600 MG) at night. , Disp: , Rfl:  .  tamsulosin (FLOMAX) 0.4 MG CAPS capsule, Take 1 capsule (0.4 mg total) by mouth daily., Disp: 90 capsule, Rfl: 1 .  aspirin 81 MG tablet, Take 81 mg by mouth 2 (two) times daily., Disp: , Rfl:  .  CIALIS 5 MG tablet, TAKE 1 TABLET (5 MG TOTAL) BY MOUTH DAILY AS NEEDED FOR ERECTILE DYSFUNCTION. (Patient not taking: Reported on 03/17/2015), Disp: 30 tablet, Rfl: 3 .  rizatriptan (MAXALT) 10 MG tablet, Take 1 tablet (10 mg total) by mouth as needed. May repeat in 2 hrs if needed. (Patient not taking: Reported on 03/17/2015), Disp: 10 tablet, Rfl: 3  Current facility-administered medications:  .  acetaminophen (TYLENOL) tablet 975 mg, 975 mg, Oral, Once, Leandrew Koyanagi, MD .  sodium chloride 0.9 % bolus 1,000 mL, 1,000 mL, Intravenous, Once, Leandrew Koyanagi, MD    Review of Systems  Constitutional: Negative for fever.  Gastrointestinal: Negative for vomiting and constipation.  Genitourinary: Positive for difficulty urinating.  Skin: Negative for rash.       Objective:   Physical Exam  Constitutional: He is oriented to person, place, and time. He appears well-developed and well-nourished. No distress.  HENT:  Head: Normocephalic and atraumatic.  Eyes: EOM are normal. Pupils are equal, round, and reactive to light.  Neck: Neck supple.  Cardiovascular: Normal rate.  Pulmonary/Chest: Effort normal. No respiratory distress.  Musculoskeletal: Normal range of motion.  Neurological: He is alert and oriented to person, place, and time.  Skin: Skin is warm and dry.  Psychiatric: He has a normal mood and affect. His behavior is normal.  Nursing note and vitals reviewed. BP 124/72 mmHg  Pulse 71  Temp(Src) 97.9 F (36.6 C) (Oral)  Resp 18  Ht 6' (1.829 m)  Wt 222 lb (100.699 kg)  BMI 30.10 kg/m2  SpO2 98%         Assessment & Plan:  Lithium use Bipolar disorder in full  remission, most recent episode unspecified type  In view of the wonderful control he has had with lithium over many years without kidney injury, it makes sense to not change anything at but continue to follow creatinine and have discussions with his psychiatrist about a change in therapy  Disorder of prostate --TURP next  Elevated serum creatinine  Check labs in 4-8 weeks I have completed the patient encounter in its entirety as documented by the scribe, with editing by me where necessary. Robert P. Laney Pastor, M.D.

## 2015-03-23 DIAGNOSIS — F3181 Bipolar II disorder: Secondary | ICD-10-CM | POA: Diagnosis not present

## 2015-03-23 DIAGNOSIS — N400 Enlarged prostate without lower urinary tract symptoms: Secondary | ICD-10-CM | POA: Diagnosis not present

## 2015-03-23 DIAGNOSIS — N529 Male erectile dysfunction, unspecified: Secondary | ICD-10-CM | POA: Diagnosis not present

## 2015-03-23 DIAGNOSIS — N401 Enlarged prostate with lower urinary tract symptoms: Secondary | ICD-10-CM | POA: Diagnosis not present

## 2015-03-23 DIAGNOSIS — R339 Retention of urine, unspecified: Secondary | ICD-10-CM | POA: Diagnosis not present

## 2015-03-23 DIAGNOSIS — N289 Disorder of kidney and ureter, unspecified: Secondary | ICD-10-CM | POA: Diagnosis not present

## 2015-03-23 DIAGNOSIS — D291 Benign neoplasm of prostate: Secondary | ICD-10-CM | POA: Diagnosis not present

## 2015-03-23 DIAGNOSIS — K429 Umbilical hernia without obstruction or gangrene: Secondary | ICD-10-CM | POA: Diagnosis not present

## 2015-03-23 DIAGNOSIS — N3289 Other specified disorders of bladder: Secondary | ICD-10-CM | POA: Diagnosis not present

## 2015-03-24 DIAGNOSIS — F3181 Bipolar II disorder: Secondary | ICD-10-CM | POA: Diagnosis not present

## 2015-03-24 DIAGNOSIS — N401 Enlarged prostate with lower urinary tract symptoms: Secondary | ICD-10-CM | POA: Diagnosis not present

## 2015-03-24 DIAGNOSIS — R339 Retention of urine, unspecified: Secondary | ICD-10-CM | POA: Diagnosis not present

## 2015-03-24 DIAGNOSIS — N289 Disorder of kidney and ureter, unspecified: Secondary | ICD-10-CM | POA: Diagnosis not present

## 2015-03-24 DIAGNOSIS — N529 Male erectile dysfunction, unspecified: Secondary | ICD-10-CM | POA: Diagnosis not present

## 2015-03-24 DIAGNOSIS — N3289 Other specified disorders of bladder: Secondary | ICD-10-CM | POA: Diagnosis not present

## 2015-03-28 DIAGNOSIS — R339 Retention of urine, unspecified: Secondary | ICD-10-CM | POA: Diagnosis not present

## 2015-03-30 DIAGNOSIS — D649 Anemia, unspecified: Secondary | ICD-10-CM | POA: Diagnosis not present

## 2015-04-10 DIAGNOSIS — D649 Anemia, unspecified: Secondary | ICD-10-CM | POA: Diagnosis not present

## 2015-05-30 DIAGNOSIS — H2513 Age-related nuclear cataract, bilateral: Secondary | ICD-10-CM | POA: Diagnosis not present

## 2015-06-01 DIAGNOSIS — F3174 Bipolar disorder, in full remission, most recent episode manic: Secondary | ICD-10-CM | POA: Diagnosis not present

## 2015-06-18 ENCOUNTER — Ambulatory Visit (INDEPENDENT_AMBULATORY_CARE_PROVIDER_SITE_OTHER): Payer: Medicare Other

## 2015-06-18 DIAGNOSIS — Z23 Encounter for immunization: Secondary | ICD-10-CM

## 2015-06-30 DIAGNOSIS — N401 Enlarged prostate with lower urinary tract symptoms: Secondary | ICD-10-CM | POA: Diagnosis not present

## 2015-06-30 DIAGNOSIS — R338 Other retention of urine: Secondary | ICD-10-CM | POA: Diagnosis not present

## 2015-07-11 ENCOUNTER — Ambulatory Visit (INDEPENDENT_AMBULATORY_CARE_PROVIDER_SITE_OTHER): Payer: Medicare Other | Admitting: Internal Medicine

## 2015-07-11 VITALS — BP 130/82 | HR 68 | Temp 98.3°F | Resp 18 | Ht 71.5 in | Wt 235.0 lb

## 2015-07-11 DIAGNOSIS — N182 Chronic kidney disease, stage 2 (mild): Secondary | ICD-10-CM | POA: Diagnosis not present

## 2015-07-11 DIAGNOSIS — Z79899 Other long term (current) drug therapy: Secondary | ICD-10-CM

## 2015-07-11 DIAGNOSIS — F3170 Bipolar disorder, currently in remission, most recent episode unspecified: Secondary | ICD-10-CM | POA: Diagnosis not present

## 2015-07-11 NOTE — Progress Notes (Signed)
Subjective:    Patient ID: Christian Parsons, male    DOB: 05/17/1946, 69 y.o.   MRN: [id number] This chart was scribed for Tami Lin, MD by Zola Button, Medical Scribe. This patient was seen in Room 10 and the patient's care was started at 2:12 PM.   HPI HPI Comments: Christian Parsons is a 69 y.o. male with a history of bipolar disorder who presents to the [facility] for a follow-up. See last OV--[facility] prefers off lithium CKD finding with microcysts renal thought 2nd to lithium. Psych Dr. Caprice Beaver has cut his lithium dose to 450 mg.  He did have blood work and UA when he was seen at [facility] last results wnl.. He  started taking Depakote as lith cut back 250qd and believes this has been causing him to gain some weight. He is also on 200 mg Lamictal currently. When he mistakenly went to 300 he couldn't sleep.  As usual he has done lots of research on the issues before him.  He has a ventral hernia repair this past year on 6/3 and had a TURP on 7/7. Patient has not had any problems in his abdomen. Back hiking with renewed effort.  Patient Active Problem List   Diagnosis Date Noted  . Benign essential tremor 06/16/2013  . Migraine headache with aura 12/25/2011  . BMI 31.0-31.9,adult 12/25/2011  . Right bundle branch block 12/25/2011  . Bipolar disorder ([place])     Current outpatient prescriptions:  .  divalproex (DEPAKOTE) 250 MG DR tablet, Take 250 mg by mouth 2 (two) times daily., Disp: , Rfl:  .  finasteride (PROSCAR) 5 MG tablet, Take 5 mg by mouth daily., Disp: , Rfl:  .  lamoTRIgine (LAMICTAL) 200 MG tablet, Take 200 mg by mouth daily., Disp: , Rfl:  .  lithium carbonate 300 MG capsule, Take 300-600 mg by mouth 2 (two) times daily with a meal. Take 1 capsule (300 MG) in the morning and 2 capsules (600 MG) at night. , Disp: , Rfl:  .  Probiotic Product (PROBIOTIC DAILY PO), Take by mouth., Disp: , Rfl:  .  rizatriptan (MAXALT) 10 MG tablet, Take 1 tablet (10 mg  total) by mouth as needed. May repeat in 2 hrs if needed., Disp: 10 tablet, Rfl: 3 .  aspirin 81 MG tablet, Take 81 mg by mouth 2 (two) times daily., Disp: , Rfl:  .  CIALIS 5 MG tablet, TAKE 1 TABLET (5 MG TOTAL) BY MOUTH DAILY AS NEEDED FOR ERECTILE DYSFUNCTION. (Patient not taking: Reported on 03/17/2015), Disp: 30 tablet, Rfl: 3 .  ciprofloxacin (CIPRO) 500 MG tablet, Take 500 mg by mouth 2 (two) times daily., Disp: , Rfl:  .  tamsulosin (FLOMAX) 0.4 MG CAPS capsule, Take 1 capsule (0.4 mg total) by mouth daily. (Patient not taking: Reported on 07/11/2015), Disp: 90 capsule, Rfl: 1  Current facility-administered medications:  .  acetaminophen (TYLENOL) tablet 975 mg, 975 mg, Oral, Once, Leandrew Koyanagi, MD .  sodium chloride 0.9 % bolus 1,000 mL, 1,000 mL, Intravenous, Once, Leandrew Koyanagi, MD  Review of Systems [place] [facility] stable    Objective:   Physical Exam  Constitutional: He is oriented to person, place, and time. He appears well-developed and well-nourished. No distress.  HENT:  Head: Normocephalic and atraumatic.  Mouth/Throat: Oropharynx is clear and moist. No oropharyngeal exudate.  Eyes: Pupils are equal, round, and reactive to light.  Neck: Neck supple.  Cardiovascular: Normal rate.  Pulmonary/Chest: Effort normal.  Musculoskeletal: He exhibits no edema.  Neurological: He is alert and oriented to person, place, and time. No cranial nerve deficit.  Skin: Skin is warm and dry. No rash noted.  Psychiatric: He has a normal mood and affect. His behavior is normal. Judgment and thought content normal.  No confusion,flights of ideas, pressured speech etc  Nursing note and vitals reviewed. BP 130/82 mmHg  Pulse 68  Temp(Src) 98.3 F (36.8 C) (Oral)  Resp 18  Ht 5' 11.5" (1.816 m)  Wt 235 lb (106.595 kg)  BMI 32.32 kg/m2  SpO2 99%         Assessment & Plan:  Bipolar disorder in full remission, most recent episode unspecified type (HCC)  Lithium use  CKD  (chronic kidney disease) stage 2 or early 3 - followed at [facility]--lithium toxicity suspected  I agree with him and think he should stop depakote, then lithium and try stabilizing on lamictal alone 200mg  He'll discuss with psychiatry By signing my name below, I, Zola Button, attest that this documentation has been prepared under the direction and in the presence of Tami Lin, MD.  Electronically Signed: Zola Button, Medical Scribe. 07/11/2015. 2:12 PM.

## 2015-08-14 ENCOUNTER — Encounter: Payer: Self-pay | Admitting: Internal Medicine

## 2015-08-16 ENCOUNTER — Other Ambulatory Visit: Payer: Self-pay | Admitting: Internal Medicine

## 2015-08-25 ENCOUNTER — Ambulatory Visit (INDEPENDENT_AMBULATORY_CARE_PROVIDER_SITE_OTHER): Payer: Medicare Other | Admitting: Internal Medicine

## 2015-08-25 ENCOUNTER — Encounter: Payer: Self-pay | Admitting: Internal Medicine

## 2015-08-25 VITALS — BP 149/85 | HR 71 | Temp 98.1°F | Resp 16 | Ht 72.0 in | Wt 233.0 lb

## 2015-08-25 DIAGNOSIS — R739 Hyperglycemia, unspecified: Secondary | ICD-10-CM

## 2015-08-25 DIAGNOSIS — R748 Abnormal levels of other serum enzymes: Secondary | ICD-10-CM

## 2015-08-25 DIAGNOSIS — N509 Disorder of male genital organs, unspecified: Secondary | ICD-10-CM

## 2015-08-25 DIAGNOSIS — R5383 Other fatigue: Secondary | ICD-10-CM

## 2015-08-25 DIAGNOSIS — R7989 Other specified abnormal findings of blood chemistry: Secondary | ICD-10-CM

## 2015-08-25 DIAGNOSIS — E785 Hyperlipidemia, unspecified: Secondary | ICD-10-CM

## 2015-08-25 DIAGNOSIS — N5089 Other specified disorders of the male genital organs: Secondary | ICD-10-CM

## 2015-08-25 LAB — POCT GLYCOSYLATED HEMOGLOBIN (HGB A1C): Hemoglobin A1C: 5.4

## 2015-08-25 LAB — COMPREHENSIVE METABOLIC PANEL
ALK PHOS: 72 U/L (ref 40–115)
ALT: 22 U/L (ref 9–46)
AST: 21 U/L (ref 10–35)
Albumin: 4.3 g/dL (ref 3.6–5.1)
BUN: 20 mg/dL (ref 7–25)
CALCIUM: 9.5 mg/dL (ref 8.6–10.3)
CHLORIDE: 107 mmol/L (ref 98–110)
CO2: 26 mmol/L (ref 20–31)
Creat: 1.41 mg/dL — ABNORMAL HIGH (ref 0.70–1.25)
Glucose, Bld: 88 mg/dL (ref 65–99)
POTASSIUM: 4.5 mmol/L (ref 3.5–5.3)
Sodium: 141 mmol/L (ref 135–146)
TOTAL PROTEIN: 7.2 g/dL (ref 6.1–8.1)
Total Bilirubin: 0.5 mg/dL (ref 0.2–1.2)

## 2015-08-25 LAB — LIPID PANEL
CHOL/HDL RATIO: 5.2 ratio — AB (ref <=5.0)
CHOLESTEROL: 141 mg/dL (ref 125–200)
HDL: 27 mg/dL — AB (ref >=40)
LDL Cholesterol: 63 mg/dL (ref <130)
TRIGLYCERIDES: 254 mg/dL — AB (ref <150)
VLDL: 51 mg/dL — ABNORMAL HIGH (ref <30)

## 2015-08-25 LAB — TSH: TSH: 1.006 u[IU]/mL (ref 0.350–4.500)

## 2015-08-25 NOTE — Patient Instructions (Signed)
[place]

## 2015-08-26 ENCOUNTER — Other Ambulatory Visit: Payer: Self-pay | Admitting: Internal Medicine

## 2015-08-26 NOTE — Progress Notes (Signed)
   Subjective:    Patient ID: Christian Parsons, male    DOB: [birth year], 69 y.o.   MRN: [id number]  HPI?new test lump L-no pain or gu sxt --unsure if present longer than 6 weeks  Also still w/ abd pain around surg mesh site that affect hiking  Notes fatigues easily with hikes--trying to regain old stamina but not back up to par yet--no DOE  Also trying to wean lithium at req of neph at [facility] due to CKD---tho note his abn cr happened post op with sepsis from uti-cateter  Also had low back aching w/out radic sxt over few months as trying to get into shape  Review of Systems  Constitutional: Negative for fever, appetite change and unexpected weight change.  Eyes: Negative for visual disturbance.  Respiratory: Negative for shortness of breath.   Cardiovascular: Negative for chest pain, palpitations and leg swelling.  Genitourinary: Negative for dysuria and difficulty urinating.       Objective:   Physical Exam BP 149/85 mmHg  Pulse 71  Temp(Src) 98.1 F (36.7 C)  Resp 16  Ht 6' (1.829 m)  Wt 233 lb (105.688 kg)  BMI 31.59 kg/m2 HEENT cl Ht reg MS- sl tender lumbar gen w/out SLR sxt L test with firm 1cm lump upper pole-nontend Test otherwise ok bilat eatr no edema Mood good Appropriate concern     Assessment & Plan:  Elevated serum creatinine - Plan: Comprehensive metabolic panel -has rensal fu early Jan  Hyperglycemia - Plan: POCT glycosylated hemoglobin (Hb A1C) --see past/needs A1C  Hyperlipidemia - Plan: Lipid panel off meds  Easy fatigability - Plan: TSH  Testicular mass - Plan: US Scrotum  Addend --labs 12/10 Results for orders placed or performed in visit on 08/25/15  Comprehensive metabolic panel  Result Value Ref Range   Sodium 141 135 - 146 mmol/L   Potassium 4.5 3.5 - 5.3 mmol/L   Chloride 107 98 - 110 mmol/L   CO2 26 20 - 31 mmol/L   Glucose, Bld 88 65 - 99 mg/dL   BUN 20 7 - 25 mg/dL   Creat 1.41 (H) 0.70 - 1.25 mg/dL   Total Bilirubin 0.5 0.2 -  1.2 mg/dL   Alkaline Phosphatase 72 40 - 115 U/L   AST 21 10 - 35 U/L   ALT 22 9 - 46 U/L   Total Protein 7.2 6.1 - 8.1 g/dL   Albumin 4.3 3.6 - 5.1 g/dL   Calcium 9.5 8.6 - 10.3 mg/dL  Lipid panel  Result Value Ref Range   Cholesterol 141 125 - 200 mg/dL   Triglycerides 254 (H) <150 mg/dL   HDL 27 (L) >=40 mg/dL   Total CHOL/HDL Ratio 5.2 (H) <=5.0 Ratio   VLDL 51 (H) <30 mg/dL   LDL Cholesterol 63 <130 mg/dL  TSH  Result Value Ref Range   TSH 1.006 0.350 - 4.500 uIU/mL  POCT glycosylated hemoglobin (Hb A1C)  Result Value Ref Range   Hemoglobin A1C 5.4

## 2015-08-29 ENCOUNTER — Other Ambulatory Visit: Payer: Medicare Other

## 2015-08-29 ENCOUNTER — Telehealth: Payer: Self-pay

## 2015-08-29 DIAGNOSIS — N503 Cyst of epididymis: Secondary | ICD-10-CM | POA: Diagnosis not present

## 2015-08-29 DIAGNOSIS — N433 Hydrocele, unspecified: Secondary | ICD-10-CM | POA: Diagnosis not present

## 2015-08-29 NOTE — Telephone Encounter (Signed)
Pt states that dr Laney Pastor will be calling him this after noon and he would like the phone number to be changed to [phone] for him to call

## 2015-08-29 NOTE — Telephone Encounter (Signed)
Left message re- labs

## 2015-08-29 NOTE — Telephone Encounter (Signed)
Christian Parsons from [name] called because she stated she called earlier and requested that we change an ultrasound order that Dr. Laney Pastor placed. They had sent a fax over to just have Dr. Laney Pastor sign the Ultrasound. I have spoke to [name] and she stated that she has already sent the fax over.

## 2015-08-30 ENCOUNTER — Telehealth: Payer: Self-pay

## 2015-08-30 NOTE — Telephone Encounter (Signed)
He had this done at [facility], can we look out for this.

## 2015-08-30 NOTE — Telephone Encounter (Signed)
I didn't see an ultrasound report for this patient. Faxes were done earlier this morning and it looks like the documents placed in Dr. Ninfa Meeker box are no longer in there. He may have taken them with him to 104 to review. Not sure if report is with those am faxes or not.

## 2015-08-30 NOTE — Telephone Encounter (Signed)
Pt wanted to know his ultrasound results.  Please advise  [phone]

## 2015-08-30 NOTE — Telephone Encounter (Signed)
Pt called again and would like Dr Laney Pastor to give him a call regarding his ultrasound results. Please call [phone]

## 2015-08-31 NOTE — Telephone Encounter (Signed)
Pt called again to follow-up on his ultrasound results. Pt was told he would be hearing from [place] 08/29/15 and doesn't understand why it is taking so long to get a response from [place]. Pt is very upset. He would like a call ASAP.

## 2015-08-31 NOTE — Telephone Encounter (Signed)
I called Christian Parsons and gave him his testicular u/s results. Hypoechoic focus of left testicle with recommendation for f/u ultrasound in 6-12 months. Christian Parsons very unhappy that he is hearing the results of his ultrasound today. He feels he should have heard Tuesday after he had the ultrasound. He had at [facility] so the results were faxed. He feels this means "the system only works most of the time and this is unacceptable". He wants to talk to Dr. Laney Pastor about the results and his recommendations. He is going to come in to walk-in center when Laney Pastor is working.

## 2015-09-01 ENCOUNTER — Other Ambulatory Visit: Payer: Medicare Other

## 2015-09-02 ENCOUNTER — Encounter: Payer: Self-pay | Admitting: Internal Medicine

## 2015-09-27 ENCOUNTER — Other Ambulatory Visit: Payer: Self-pay | Admitting: Internal Medicine

## 2015-10-13 DIAGNOSIS — N200 Calculus of kidney: Secondary | ICD-10-CM | POA: Diagnosis not present

## 2015-10-13 DIAGNOSIS — F3181 Bipolar II disorder: Secondary | ICD-10-CM | POA: Diagnosis not present

## 2015-10-13 DIAGNOSIS — N281 Cyst of kidney, acquired: Secondary | ICD-10-CM | POA: Diagnosis not present

## 2015-10-13 DIAGNOSIS — Z87442 Personal history of urinary calculi: Secondary | ICD-10-CM | POA: Diagnosis not present

## 2015-10-13 DIAGNOSIS — N2889 Other specified disorders of kidney and ureter: Secondary | ICD-10-CM | POA: Diagnosis not present

## 2015-10-13 DIAGNOSIS — R339 Retention of urine, unspecified: Secondary | ICD-10-CM | POA: Diagnosis not present

## 2015-10-13 DIAGNOSIS — Z9079 Acquired absence of other genital organ(s): Secondary | ICD-10-CM | POA: Diagnosis not present

## 2015-10-13 DIAGNOSIS — N529 Male erectile dysfunction, unspecified: Secondary | ICD-10-CM | POA: Diagnosis not present

## 2015-10-13 DIAGNOSIS — N401 Enlarged prostate with lower urinary tract symptoms: Secondary | ICD-10-CM | POA: Diagnosis not present

## 2015-10-13 DIAGNOSIS — Z79899 Other long term (current) drug therapy: Secondary | ICD-10-CM | POA: Diagnosis not present

## 2015-11-09 ENCOUNTER — Institutional Professional Consult (permissible substitution): Payer: Medicare Other | Admitting: Neurology

## 2015-11-14 DIAGNOSIS — N183 Chronic kidney disease, stage 3 (moderate): Secondary | ICD-10-CM | POA: Diagnosis not present

## 2015-11-14 DIAGNOSIS — Z9079 Acquired absence of other genital organ(s): Secondary | ICD-10-CM | POA: Diagnosis not present

## 2015-11-14 DIAGNOSIS — N400 Enlarged prostate without lower urinary tract symptoms: Secondary | ICD-10-CM | POA: Diagnosis not present

## 2015-11-14 DIAGNOSIS — R635 Abnormal weight gain: Secondary | ICD-10-CM | POA: Diagnosis not present

## 2015-11-14 DIAGNOSIS — K047 Periapical abscess without sinus: Secondary | ICD-10-CM | POA: Diagnosis not present

## 2015-11-14 DIAGNOSIS — Z79899 Other long term (current) drug therapy: Secondary | ICD-10-CM | POA: Diagnosis not present

## 2015-11-27 ENCOUNTER — Institutional Professional Consult (permissible substitution): Payer: Medicare Other | Admitting: Neurology

## 2015-12-20 IMAGING — CR DG ABDOMEN 1V
1 series · 1 of 1 positions shown · non-contrast
Comparison: None.

CLINICAL DATA: Abdominal pain.

EXAM:
ABDOMEN - 1 VIEW

[AP]
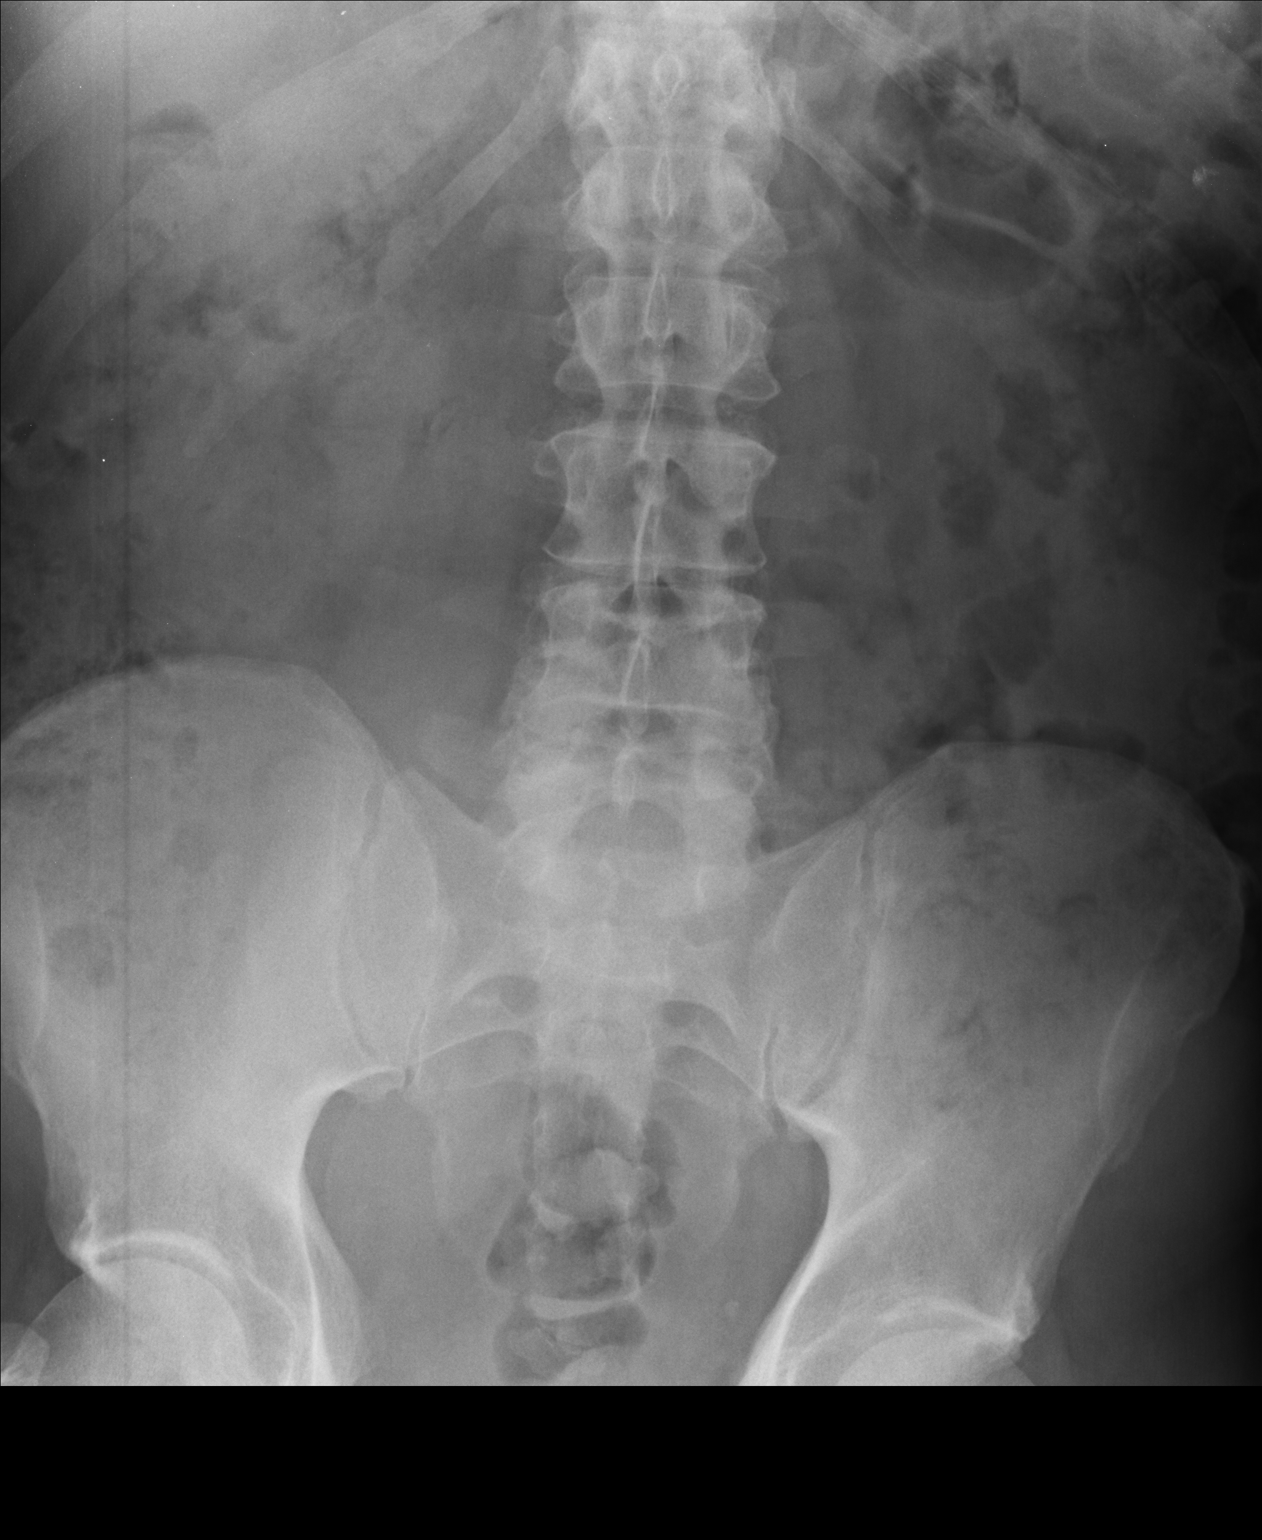

[1 of 1 positions shown; findings below may reference images not displayed]

FINDINGS: No gaseous small bowel dilatation to suggest obstruction. Air and
stool was seen scattered along the length of the colon. No
unexpected abdominal pelvic calcification. The visualized bony
structures are unremarkable.
IMPRESSION: Normal bowel gas pattern.

## 2016-03-18 ENCOUNTER — Ambulatory Visit (INDEPENDENT_AMBULATORY_CARE_PROVIDER_SITE_OTHER): Payer: Medicare Other | Admitting: Internal Medicine

## 2016-03-18 VITALS — BP 116/78 | HR 62 | Temp 97.9°F | Resp 15 | Ht 72.0 in | Wt 231.4 lb

## 2016-03-18 DIAGNOSIS — N182 Chronic kidney disease, stage 2 (mild): Secondary | ICD-10-CM

## 2016-03-18 DIAGNOSIS — R748 Abnormal levels of other serum enzymes: Secondary | ICD-10-CM

## 2016-03-18 DIAGNOSIS — R7989 Other specified abnormal findings of blood chemistry: Secondary | ICD-10-CM

## 2016-03-18 LAB — BASIC METABOLIC PANEL
BUN: 20 mg/dL (ref 7–25)
CHLORIDE: 107 mmol/L (ref 98–110)
CO2: 25 mmol/L (ref 20–31)
Calcium: 9.8 mg/dL (ref 8.6–10.3)
Creat: 1.56 mg/dL — ABNORMAL HIGH (ref 0.70–1.25)
GLUCOSE: 99 mg/dL (ref 65–99)
POTASSIUM: 4.9 mmol/L (ref 3.5–5.3)
SODIUM: 143 mmol/L (ref 135–146)

## 2016-03-18 NOTE — Patient Instructions (Addendum)
[place]--on [address].      IF you received an x-ray today, you will receive an invoice from [facility]. Please contact [facility] at [phone] with questions or concerns regarding your invoice.   IF you received labwork today, you will receive an invoice from [employer]. Please contact [facility] at [phone] with questions or concerns regarding your invoice.   Our billing staff will not be able to assist you with questions regarding bills from these companies.  You will be contacted with the lab results as soon as they are available. The fastest way to get your results is to activate your My Chart account. Instructions are located on the last page of this paperwork. If you have not heard from [place] regarding the results in 2 weeks, please contact this office.

## 2016-03-18 NOTE — Progress Notes (Signed)
Chief Complaint  Patient presents with  . Follow-up    blood work, kidney function    Elevated serum creatinine - ? Lithium toxicity CKD (chronic kidney disease) stage 2 or early 3  Doing well--now back at full activity OFF lithium and Psychiatry wants one more cr level(has been seen at [facility] as well) Continues on lamictal noww at 200 and possibly declinig--doing well for first long spell in several years   Plan BMET Call results CPE 11/17 Dr Tamala Julian

## 2016-03-29 ENCOUNTER — Telehealth: Payer: Self-pay

## 2016-03-29 NOTE — Telephone Encounter (Signed)
-----   Message from Leandrew Koyanagi, MD sent at 03/18/2016  3:53 PM EDT ----- Send copy to dr Letta Moynahan

## 2016-04-16 DIAGNOSIS — N183 Chronic kidney disease, stage 3 (moderate): Secondary | ICD-10-CM | POA: Diagnosis not present

## 2016-04-16 DIAGNOSIS — R339 Retention of urine, unspecified: Secondary | ICD-10-CM | POA: Diagnosis not present

## 2016-04-16 DIAGNOSIS — Z9079 Acquired absence of other genital organ(s): Secondary | ICD-10-CM | POA: Diagnosis not present

## 2016-06-03 DIAGNOSIS — H2513 Age-related nuclear cataract, bilateral: Secondary | ICD-10-CM | POA: Diagnosis not present

## 2016-07-23 ENCOUNTER — Telehealth: Payer: Self-pay | Admitting: Family Medicine

## 2016-07-23 DIAGNOSIS — N182 Chronic kidney disease, stage 2 (mild): Secondary | ICD-10-CM

## 2016-07-23 DIAGNOSIS — E785 Hyperlipidemia, unspecified: Secondary | ICD-10-CM

## 2016-07-23 DIAGNOSIS — Z1159 Encounter for screening for other viral diseases: Secondary | ICD-10-CM

## 2016-07-23 NOTE — Telephone Encounter (Signed)
Pt has an appt next Wed and want to know if you can put in orders early so results can be in when he has his appointment please respond

## 2016-07-23 NOTE — Telephone Encounter (Signed)
Future order placed for labs; please advise patient.

## 2016-07-24 NOTE — Telephone Encounter (Signed)
Spoke to patient and advised him that Dr. Tamala Julian has placed an order for him to have his blood drawn.  Advised him to come early in the day so that he is fasting.  He said he will be here at 8am tomorrow.

## 2016-07-25 ENCOUNTER — Other Ambulatory Visit (INDEPENDENT_AMBULATORY_CARE_PROVIDER_SITE_OTHER): Payer: Medicare Other | Admitting: Family Medicine

## 2016-07-25 DIAGNOSIS — E785 Hyperlipidemia, unspecified: Secondary | ICD-10-CM

## 2016-07-25 DIAGNOSIS — Z1159 Encounter for screening for other viral diseases: Secondary | ICD-10-CM

## 2016-07-25 DIAGNOSIS — N182 Chronic kidney disease, stage 2 (mild): Secondary | ICD-10-CM | POA: Diagnosis not present

## 2016-07-25 LAB — CBC WITH DIFFERENTIAL/PLATELET
Basophils Absolute: 0 {cells}/uL (ref 0–200)
Basophils Relative: 0 %
Eosinophils Absolute: 104 {cells}/uL (ref 15–500)
Eosinophils Relative: 2 %
HCT: 47.2 % (ref 38.5–50.0)
Hemoglobin: 16.1 g/dL (ref 13.2–17.1)
Lymphocytes Relative: 17 %
Lymphs Abs: 884 {cells}/uL (ref 850–3900)
MCH: 32.1 pg (ref 27.0–33.0)
MCHC: 34.1 g/dL (ref 32.0–36.0)
MCV: 94 fL (ref 80.0–100.0)
MPV: 11.6 fL (ref 7.5–12.5)
Monocytes Absolute: 520 {cells}/uL (ref 200–950)
Monocytes Relative: 10 %
Neutro Abs: 3692 {cells}/uL (ref 1500–7800)
Neutrophils Relative %: 71 %
Platelets: 157 10*3/uL (ref 140–400)
RBC: 5.02 MIL/uL (ref 4.20–5.80)
RDW: 13.8 % (ref 11.0–15.0)
WBC: 5.2 10*3/uL (ref 3.8–10.8)

## 2016-07-25 LAB — LIPID PANEL
CHOLESTEROL: 143 mg/dL (ref <200)
HDL: 32 mg/dL — ABNORMAL LOW (ref >40)
LDL CALC: 81 mg/dL
Total CHOL/HDL Ratio: 4.5 Ratio (ref <5.0)
Triglycerides: 150 mg/dL — ABNORMAL HIGH (ref <150)
VLDL: 30 mg/dL (ref <30)

## 2016-07-25 LAB — HEPATITIS C ANTIBODY: HCV Ab: NEGATIVE

## 2016-07-25 LAB — COMPREHENSIVE METABOLIC PANEL
ALT: 27 U/L (ref 9–46)
AST: 21 U/L (ref 10–35)
Albumin: 4.6 g/dL (ref 3.6–5.1)
Alkaline Phosphatase: 71 U/L (ref 40–115)
BUN: 22 mg/dL (ref 7–25)
CHLORIDE: 108 mmol/L (ref 98–110)
CO2: 26 mmol/L (ref 20–31)
CREATININE: 1.77 mg/dL — AB (ref 0.70–1.25)
Calcium: 9.8 mg/dL (ref 8.6–10.3)
Glucose, Bld: 102 mg/dL — ABNORMAL HIGH (ref 65–99)
Potassium: 4.8 mmol/L (ref 3.5–5.3)
SODIUM: 143 mmol/L (ref 135–146)
TOTAL PROTEIN: 7.4 g/dL (ref 6.1–8.1)
Total Bilirubin: 0.7 mg/dL (ref 0.2–1.2)

## 2016-07-30 ENCOUNTER — Encounter: Payer: Self-pay | Admitting: Family Medicine

## 2016-07-30 ENCOUNTER — Ambulatory Visit (INDEPENDENT_AMBULATORY_CARE_PROVIDER_SITE_OTHER): Payer: Medicare Other | Admitting: Family Medicine

## 2016-07-30 VITALS — BP 130/80 | HR 66 | Temp 97.8°F | Resp 16 | Ht 72.0 in | Wt 236.6 lb

## 2016-07-30 DIAGNOSIS — G250 Essential tremor: Secondary | ICD-10-CM

## 2016-07-30 DIAGNOSIS — Z23 Encounter for immunization: Secondary | ICD-10-CM

## 2016-07-30 DIAGNOSIS — N5089 Other specified disorders of the male genital organs: Secondary | ICD-10-CM

## 2016-07-30 DIAGNOSIS — N182 Chronic kidney disease, stage 2 (mild): Secondary | ICD-10-CM

## 2016-07-30 DIAGNOSIS — I4510 Unspecified right bundle-branch block: Secondary | ICD-10-CM | POA: Diagnosis not present

## 2016-07-30 DIAGNOSIS — F3178 Bipolar disorder, in full remission, most recent episode mixed: Secondary | ICD-10-CM | POA: Diagnosis not present

## 2016-07-30 DIAGNOSIS — Z0000 Encounter for general adult medical examination without abnormal findings: Secondary | ICD-10-CM

## 2016-07-30 DIAGNOSIS — N401 Enlarged prostate with lower urinary tract symptoms: Secondary | ICD-10-CM | POA: Diagnosis not present

## 2016-07-30 DIAGNOSIS — G43109 Migraine with aura, not intractable, without status migrainosus: Secondary | ICD-10-CM | POA: Diagnosis not present

## 2016-07-30 DIAGNOSIS — N509 Disorder of male genital organs, unspecified: Secondary | ICD-10-CM

## 2016-07-30 DIAGNOSIS — N138 Other obstructive and reflux uropathy: Secondary | ICD-10-CM | POA: Diagnosis not present

## 2016-07-30 LAB — POCT URINALYSIS DIP (MANUAL ENTRY)
Bilirubin, UA: NEGATIVE
GLUCOSE UA: NEGATIVE
Ketones, POC UA: NEGATIVE
Leukocytes, UA: NEGATIVE
NITRITE UA: NEGATIVE
Protein Ur, POC: NEGATIVE
Spec Grav, UA: 1.01
UROBILINOGEN UA: 0.2
pH, UA: 7

## 2016-07-30 NOTE — Patient Instructions (Addendum)
IF you received an x-ray today, you will receive an invoice from [facility]. Please contact [facility] at [phone] with questions or concerns regarding your invoice.   IF you received labwork today, you will receive an invoice from [employer]. Please contact [facility] at [phone] with questions or concerns regarding your invoice.   Our billing staff will not be able to assist you with questions regarding bills from these companies.  You will be contacted with the lab results as soon as they are available. The fastest way to get your results is to activate your My Chart account. Instructions are located on the last page of this paperwork. If you have not heard from [place] regarding the results in 2 weeks, please contact this office.    Influenza (Flu) Vaccine (Inactivated or Recombinant): What You Need to Know 1. Why get vaccinated? Influenza ("flu") is a contagious disease that spreads around the [place] every year, usually between October and May. Flu is caused by influenza viruses, and is spread mainly by coughing, sneezing, and close contact. Anyone can get flu. Flu strikes suddenly and can last several days. Symptoms vary by age, but can include:  fever/chills  sore throat  muscle aches  fatigue  cough  headache  runny or stuffy nose Flu can also lead to pneumonia and blood infections, and cause diarrhea and seizures in children. If you have a medical condition, such as heart or lung disease, flu can make it worse. Flu is more dangerous for some people. Infants and young children, people 35 years of age and older, pregnant women, and people with certain health conditions or a weakened immune system are at greatest risk. Each year thousands of people in the [place] die from flu, and many more are hospitalized. Flu vaccine can:  keep you from getting flu,  make flu less severe if you do get it, and  keep you  from spreading flu to your family and other people. 2. Inactivated and recombinant flu vaccines A dose of flu vaccine is recommended every flu season. Children 6 months through 70 years of age may need two doses during the same flu season. Everyone else needs only one dose each flu season. Some inactivated flu vaccines contain a very small amount of a mercury-based preservative called thimerosal. Studies have not shown thimerosal in vaccines to be harmful, but flu vaccines that do not contain thimerosal are available. There is no live flu virus in flu shots. They cannot cause the flu. There are many flu viruses, and they are always changing. Each year a new flu vaccine is made to protect against three or four viruses that are likely to cause disease in the upcoming flu season. But even when the vaccine doesn't exactly match these viruses, it may still provide some protection. Flu vaccine cannot prevent:  flu that is caused by a virus not covered by the vaccine, or  illnesses that look like flu but are not. It takes about 2 weeks for protection to develop after vaccination, and protection lasts through the flu season. 3. Some people should not get this vaccine Tell the person who is giving you the vaccine:  If you have any severe, life-threatening allergies. If you ever had a life-threatening allergic reaction after a dose of flu vaccine, or have a severe allergy to any part of this vaccine, you may be advised not to get vaccinated. Most, but not all, types of flu vaccine contain a small  amount of egg protein.  If you ever had Guillain-Barr Syndrome (also called GBS). Some people with a history of GBS should not get this vaccine. This should be discussed with your doctor.  If you are not feeling well. It is usually okay to get flu vaccine when you have a mild illness, but you might be asked to come back when you feel better. 4. Risks of a vaccine reaction With any medicine, including vaccines,  there is a chance of reactions. These are usually mild and go away on their own, but serious reactions are also possible. Most people who get a flu shot do not have any problems with it. Minor problems following a flu shot include:  soreness, redness, or swelling where the shot was given  hoarseness  sore, red or itchy eyes  cough  fever  aches  headache  itching  fatigue If these problems occur, they usually begin soon after the shot and last 1 or 2 days. More serious problems following a flu shot can include the following:  There may be a small increased risk of Guillain-Barre Syndrome (GBS) after inactivated flu vaccine. This risk has been estimated at 1 or 2 additional cases per million people vaccinated. This is much lower than the risk of severe complications from flu, which can be prevented by flu vaccine.  Young children who get the flu shot along with pneumococcal vaccine (PCV13) and/or DTaP vaccine at the same time might be slightly more likely to have a seizure caused by fever. Ask your doctor for more information. Tell your doctor if a child who is getting flu vaccine has ever had a seizure. Problems that could happen after any injected vaccine:  People sometimes faint after a medical procedure, including vaccination. Sitting or lying down for about 15 minutes can help prevent fainting, and injuries caused by a fall. Tell your doctor if you feel dizzy, or have vision changes or ringing in the ears.  Some people get severe pain in the shoulder and have difficulty moving the arm where a shot was given. This happens very rarely.  Any medication can cause a severe allergic reaction. Such reactions from a vaccine are very rare, estimated at about 1 in a million doses, and would happen within a few minutes to a few hours after the vaccination. As with any medicine, there is a very remote chance of a vaccine causing a serious injury or death. The safety of vaccines is always  being monitored. For more information, visit: http://www.aguilar.org/ 5. What if there is a serious reaction? What should I look for? Look for anything that concerns you, such as signs of a severe allergic reaction, very high fever, or unusual behavior. Signs of a severe allergic reaction can include hives, swelling of the face and throat, difficulty breathing, a fast heartbeat, dizziness, and weakness. These would start a few minutes to a few hours after the vaccination. What should I do?  If you think it is a severe allergic reaction or other emergency that can't wait, call 9-1-1 and get the person to the nearest hospital. Otherwise, call your doctor.  Reactions should be reported to the Vaccine Adverse Event Reporting System (VAERS). Your doctor should file this report, or you can do it yourself through the VAERS web site at www.vaers.SamedayNews.es, or by calling 603-322-5532.  VAERS does not give medical advice. 6. The National Vaccine Injury Compensation Program The [employer] Vaccine Injury Compensation Program (VICP) is a federal program that was created to compensate  people who may have been injured by certain vaccines. Persons who believe they may have been injured by a vaccine can learn about the program and about filing a claim by calling (682)403-1539 or visiting the [place] website at GoldCloset.com.ee. There is a time limit to file a claim for compensation. 7. How can I learn more?  Ask your healthcare provider. He or she can give you the vaccine package insert or suggest other sources of information.  Call your local or state health department.  Contact the Centers for Disease Control and Prevention (CDC):  Call 901-472-2131 (1-800-CDC-INFO) or  Visit CDC's website at https://gibson.com/ Vaccine Information Statement, Inactivated Influenza Vaccine (04/22/2014) This information is not intended to replace advice given to you by your health care provider. Make sure you  discuss any questions you have with your health care provider. Document Released: 06/27/2006 Document Revised: 05/23/2016 Document Reviewed: 05/23/2016 Elsevier Interactive Patient Education  2017 [place] you healthy  Get these tests  Blood pressure- Have your blood pressure checked once a year by your healthcare provider.  Normal blood pressure is 120/80  Weight- Have your body mass index (BMI) calculated to screen for obesity.  BMI is a measure of body fat based on height and weight. You can also calculate your own BMI at ViewBanking.si.  Cholesterol- Have your cholesterol checked every year.  Diabetes- Have your blood sugar checked regularly if you have high blood pressure, high cholesterol, have a family history of diabetes or if you are overweight.  Screening for Colon Cancer- Colonoscopy starting at age 37.  Screening may begin sooner depending on your family history and other health conditions. Follow up colonoscopy as directed by your Gastroenterologist.  Screening for Prostate Cancer- Both blood work (PSA) and a rectal exam help screen for Prostate Cancer.  Screening begins at age 65 with African-American men and at age 55 with Caucasian men.  Screening may begin sooner depending on your family history.  Take these medicines  Aspirin- One aspirin daily can help prevent Heart disease and Stroke.  Flu shot- Every fall.  Tetanus- Every 10 years.  Zostavax- Once after the age of 51 to prevent Shingles.  Pneumonia shot- Once after the age of 36; if you are younger than 52, ask your healthcare provider if you need a Pneumonia shot.  Take these steps  Don't smoke- If you do smoke, talk to your doctor about quitting.  For tips on how to quit, go to www.smokefree.gov or call 1-800-QUIT-NOW.  Be physically active- Exercise 5 days a week for at least 30 minutes.  If you are not already physically active start slow and gradually work up to 30 minutes of  moderate physical activity.  Examples of moderate activity include walking briskly, mowing the yard, dancing, swimming, bicycling, etc.  Eat a healthy diet- Eat a variety of healthy food such as fruits, vegetables, low fat milk, low fat cheese, yogurt, lean meant, poultry, fish, beans, tofu, etc. For more information go to www.thenutritionsource.org  Drink alcohol in moderation- Limit alcohol intake to less than two drinks a day. Never drink and drive.  Dentist- Brush and floss twice daily; visit your dentist twice a year.  Depression- Your emotional health is as important as your physical health. If you're feeling down, or losing interest in things you would normally enjoy please talk to your healthcare provider.  Eye exam- Visit your eye doctor every year.  Safe sex- If you may be exposed to a sexually transmitted infection,  use a condom.  Seat belts- Seat belts can save your life; always wear one.  Smoke/Carbon Monoxide detectors- These detectors need to be installed on the appropriate level of your home.  Replace batteries at least once a year.  Skin cancer- When out in the sun, cover up and use sunscreen 15 SPF or higher.  Violence- If anyone is threatening you, please tell your healthcare provider.  Living Will/ Health care power of attorney- Speak with your healthcare provider and family.

## 2016-07-30 NOTE — Progress Notes (Signed)
Subjective:    Patient ID: Christian Parsons, male    DOB: [birth year], 70 y.o.   MRN: [id number]  07/30/2016  Annual Exam   HPI This 70 y.o. male presents for Complete Physical Examination/Annual  Wellness Examination and to establish care; previous patient of Tami Lin, MD.  Last physical: n/a Colonoscopy: 2015   Wt Readings from Last 3 Encounters:  07/30/16 236 lb 9.6 oz (107.3 kg)  03/18/16 231 lb 6.4 oz (105 kg)  08/25/15 233 lb (105.7 kg)     BP Readings from Last 3 Encounters:  07/30/16 130/80  03/18/16 116/78  08/25/15 (!) 149/85   Immunization History  Administered Date(s) Administered  . Influenza Split 07/24/2012  . Influenza,inj,Quad PF,36+ Mos 06/16/2013, 05/25/2014, 06/18/2015, 07/30/2016  . Pneumococcal Conjugate-13 07/30/2016  . Pneumococcal Polysaccharide-23 11/07/2010  . Td 01/15/2004  . Tdap 06/16/2013   Bipolar disorder: has been followed by Dr. Caprice Beaver for fifteen years for bipolar disorder; has been very stable on current medication regimen for years. Dr. Caprice Beaver is leaving practice and pt would like psychiatric medications managed by PCP.  One admission for bipolar disorder at age 22.  Doing well at this time.  Renal insufficiency: followed regularly by nephrology and urology.  Suffered with urinary retention due to BPH; has renal insufficiency due to chronic lithium toxicity.  Baseline creatinine 1.3-1.6 with no significant proteinuria.    BPH s/p TURP: followed by urology; off of finasteride and Flomax as of 09/2015.  L testicular mass: due for repeat scrotal US for follow-up.   Review of Systems  Constitutional: Negative for activity change, appetite change, chills, diaphoresis, fatigue, fever and unexpected weight change.  HENT: Negative for congestion, dental problem, drooling, ear discharge, ear pain, facial swelling, hearing loss, mouth sores, nosebleeds, postnasal drip, rhinorrhea, sinus pressure, sneezing, sore throat, tinnitus,  trouble swallowing and voice change.   Eyes: Negative for photophobia, pain, discharge, redness, itching and visual disturbance.  Respiratory: Negative for apnea, cough, choking, chest tightness, shortness of breath, wheezing and stridor.   Cardiovascular: Negative for chest pain, palpitations and leg swelling.  Gastrointestinal: Negative for abdominal pain, blood in stool, constipation, diarrhea, nausea and vomiting.  Endocrine: Negative for cold intolerance, heat intolerance, polydipsia, polyphagia and polyuria.  Genitourinary: Negative for decreased urine volume, difficulty urinating, discharge, dysuria, enuresis, flank pain, frequency, genital sores, hematuria, penile pain, penile swelling, scrotal swelling, testicular pain and urgency.  Musculoskeletal: Negative for arthralgias, back pain, gait problem, joint swelling, myalgias, neck pain and neck stiffness.  Skin: Negative for color change, pallor, rash and wound.  Allergic/Immunologic: Negative for environmental allergies, food allergies and immunocompromised state.  Neurological: Negative for dizziness, tremors, seizures, syncope, facial asymmetry, speech difficulty, weakness, light-headedness, numbness and headaches.  Hematological: Negative for adenopathy. Does not bruise/bleed easily.  Psychiatric/Behavioral: Negative for agitation, behavioral problems, confusion, decreased concentration, dysphoric mood, hallucinations, self-injury, sleep disturbance and suicidal ideas. The patient is not nervous/anxious and is not hyperactive.        Bedtime 10:30; wakes up 5:00am.    Past Medical History:  Diagnosis Date  . Anxiety   . Bipolar disorder ([place])    H/O  . BPH with urinary obstruction    s/p TURP  . Chronic kidney disease   . Chronic renal insufficiency, stage 2 (mild)    secondary to lithium toxicity; baseline creatinine 1.3-1.6  . Coronary artery disease   . Depression   . Hypertension    Past Surgical History:  Procedure  Laterality Date  . HERNIA  REPAIR    . PROSTATE SURGERY    . TRANSURETHRAL RESECTION OF PROSTATE     No Known Allergies  Social History   Social History  . Marital status: Married    Spouse name: N/A  . Number of children: 1  . Years of education: 16+   Occupational History  . retired     Music therapist   Social History Main Topics  . Smoking status: Never Smoker  . Smokeless tobacco: Never Used  . Alcohol use 1.2 oz/week    2 Standard drinks or equivalent per week  . Drug use: No  . Sexual activity: Yes    Partners: Female   Other Topics Concern  . Not on file   Social History Narrative   Exercise: walks 4-5 days per week also hikes 4 days per week 1-7 hours per day.     Advanced Directives; YES; full code but no prolonged measures.   Family History  Problem Relation Age of Onset  . Cancer Mother     breast cancer  . Dementia Father   . Arthritis Sister        Objective:    BP 130/80   Pulse 66   Temp 97.8 F (36.6 C) (Oral)   Resp 16   Ht 6' (1.829 m)   Wt 236 lb 9.6 oz (107.3 kg)   SpO2 99%   BMI 32.09 kg/m  Physical Exam  Constitutional: He is oriented to person, place, and time. He appears well-developed and well-nourished. No distress.  HENT:  Head: Normocephalic and atraumatic.  Right Ear: External ear normal.  Left Ear: External ear normal.  Nose: Nose normal.  Mouth/Throat: Oropharynx is clear and moist.  Eyes: Conjunctivae and EOM are normal. Pupils are equal, round, and reactive to light.  Neck: Normal range of motion. Neck supple. Carotid bruit is not present. No thyromegaly present.  Cardiovascular: Normal rate, regular rhythm, normal heart sounds and intact distal pulses.  Exam reveals no gallop and no friction rub.   No murmur heard. Pulmonary/Chest: Effort normal and breath sounds normal. He has no wheezes. He has no rales.  Abdominal: Soft. Bowel sounds are normal. He exhibits no distension and no mass. There is no tenderness.  There is no rebound and no guarding.  Musculoskeletal:       Right shoulder: Normal.       Left shoulder: Normal.       Cervical back: Normal.  Lymphadenopathy:    He has no cervical adenopathy.  Neurological: He is alert and oriented to person, place, and time. He has normal reflexes. No cranial nerve deficit. He exhibits normal muscle tone. Coordination normal.  Skin: Skin is warm and dry. No rash noted. He is not diaphoretic.  Psychiatric: He has a normal mood and affect. His behavior is normal. Judgment and thought content normal.   Results for orders placed or performed in visit on 07/30/16  POCT urinalysis dipstick  Result Value Ref Range   Color, UA yellow yellow   Clarity, UA clear clear   Glucose, UA negative negative   Bilirubin, UA negative negative   Ketones, POC UA negative negative   Spec Grav, UA 1.010    Blood, UA small (A) negative   pH, UA 7.0    Protein Ur, POC negative negative   Urobilinogen, UA 0.2    Nitrite, UA Negative Negative   Leukocytes, UA Negative Negative   Fall Risk  07/30/2016 03/18/2016 08/25/2015 05/25/2014 07/28/2013  Falls in the  past year? No No No No No   Depression screen [facility] 2/9 07/30/2016 03/18/2016 08/25/2015 07/11/2015 03/17/2015  Decreased Interest 0 0 0 0 0  Down, Depressed, Hopeless 0 0 0 0 0  PHQ - 2 Score 0 0 0 0 0   Functional Status Survey: Is the patient deaf or have difficulty hearing?: No Does the patient have difficulty seeing, even when wearing glasses/contacts?: No Does the patient have difficulty concentrating, remembering, or making decisions?: No Does the patient have difficulty walking or climbing stairs?: No Does the patient have difficulty dressing or bathing?: No Does the patient have difficulty doing errands alone such as visiting a doctor's office or shopping?: No      Assessment & Plan:   1. Encounter for Medicare annual wellness exam   2. Mass of left testicle   3. Needs flu shot   4. Migraine with aura and  without status migrainosus, not intractable   5. Right bundle branch block   6. Benign essential tremor   7. CKD (chronic kidney disease) stage 2 or early 3   8. Bipolar disorder, in full remission, most recent episode mixed ([place])   9. BPH with urinary obstruction   10. Need for prophylactic vaccination against Streptococcus pneumoniae (pneumococcus)    -anticipatory guidance -- exercise, weight loss. -independent with ADLs; undergoing treatment for bipolar disorder; no hearing loss; low fall risk; discussed advanced directives in detail during visit. -refer for scrotal US to follow-up on L testicluar mass. -agreeable to manage Lamictal for bipolar as long as remains stable; if becomes unstable, will warrant referral to psychiatry. -followed every six months by urology and nephrology; s/p TURP; baseline creatinine of 1.3-1.6 with recent increase; has upcoming appointment with nephrology for follow-up. -continue Maxalt for migraines.  Orders Placed This Encounter  Procedures  . US Scrotum    Standing Status:   Future    Standing Expiration Date:   09/29/2017    Scheduling Instructions:     [facility] on [employer] Specific Question:   Reason for Exam (SYMPTOM  OR DIAGNOSIS REQUIRED)    Answer:   one year follow-up 8mm L testicular nodule    Order Specific Question:   Preferred imaging location?    Answer:   External  . Flu Vaccine QUAD 36+ mos IM  . Pneumococcal conjugate vaccine 13-valent IM  . POCT urinalysis dipstick   No orders of the defined types were placed in this encounter.   No Follow-up on file.   Deepika Decatur Elayne Guerin, M.D. [facility] [address] [place], [place]  [address] [phone] phone [phone] fax

## 2016-07-30 NOTE — Progress Notes (Signed)
   Subjective:    Patient ID: Christian Parsons, male    DOB: Jul 24, 1946, 70 y.o.   MRN: [id number]  HPI    Review of Systems  Constitutional: Positive for activity change and unexpected weight change.  HENT: Negative.   Eyes: Negative.   Respiratory: Negative.   Cardiovascular: Negative.   Gastrointestinal: Negative.   Endocrine: Negative.   Genitourinary: Positive for difficulty urinating, frequency and testicular pain.  Musculoskeletal: Positive for gait problem and joint swelling.  Skin: Negative.   Allergic/Immunologic: Negative.   Neurological: Positive for headaches.  Hematological: Negative.   Psychiatric/Behavioral: Positive for decreased concentration. The patient is nervous/anxious.        Objective:   Physical Exam        Assessment & Plan:

## 2016-07-31 ENCOUNTER — Other Ambulatory Visit: Payer: Self-pay

## 2016-07-31 DIAGNOSIS — N5089 Other specified disorders of the male genital organs: Secondary | ICD-10-CM

## 2016-08-06 DIAGNOSIS — N5089 Other specified disorders of the male genital organs: Secondary | ICD-10-CM | POA: Diagnosis not present

## 2016-08-07 ENCOUNTER — Other Ambulatory Visit: Payer: Self-pay | Admitting: Family Medicine

## 2016-08-10 NOTE — Telephone Encounter (Signed)
07/30/2016 last ov 09/29/15 last refill with 2 additional

## 2016-08-12 ENCOUNTER — Encounter: Payer: Self-pay | Admitting: Family Medicine

## 2016-08-12 DIAGNOSIS — N138 Other obstructive and reflux uropathy: Secondary | ICD-10-CM | POA: Insufficient documentation

## 2016-08-12 DIAGNOSIS — N401 Enlarged prostate with lower urinary tract symptoms: Secondary | ICD-10-CM

## 2016-08-12 NOTE — Progress Notes (Signed)
Lab visit only. 

## 2016-08-15 ENCOUNTER — Telehealth: Payer: Self-pay | Admitting: Emergency Medicine

## 2016-08-15 ENCOUNTER — Telehealth: Payer: Self-pay | Admitting: Radiology

## 2016-08-15 NOTE — Telephone Encounter (Signed)
Pt given US scrotum results per Dr. Domingo Cocking- no further work up at this time, unchanged

## 2016-08-15 NOTE — Telephone Encounter (Signed)
Pt calling back for U/S results he's very upset and would like to speak with someone about this please respond

## 2016-08-15 NOTE — Telephone Encounter (Signed)
Mr Christian Parsons is calling in regarding his US scrotum results that was preformed at a [place] location. I explained to him that these results must be sent out, signed off on by you, and then scanned in. Mr. Christian Parsons became upset and stated that the same thing happened last year and he would like to know why it is taking this long to have his results scanned in.  Mr Christian Parsons would like a call back regarding the status of his results, please contact him at [phone]

## 2016-08-16 NOTE — Telephone Encounter (Signed)
Christian Chroman, RN advised patient of scrotal US results on 08/15/16; stable 3-4 mm focus L testicle; unchanged from 08/2015.

## 2016-08-20 DIAGNOSIS — N183 Chronic kidney disease, stage 3 (moderate): Secondary | ICD-10-CM | POA: Diagnosis not present

## 2016-08-20 DIAGNOSIS — Z9079 Acquired absence of other genital organ(s): Secondary | ICD-10-CM | POA: Diagnosis not present

## 2016-08-20 DIAGNOSIS — T56891A Toxic effect of other metals, accidental (unintentional), initial encounter: Secondary | ICD-10-CM | POA: Diagnosis not present

## 2016-08-20 DIAGNOSIS — N400 Enlarged prostate without lower urinary tract symptoms: Secondary | ICD-10-CM | POA: Diagnosis not present

## 2016-08-20 DIAGNOSIS — R030 Elevated blood-pressure reading, without diagnosis of hypertension: Secondary | ICD-10-CM | POA: Diagnosis not present

## 2016-08-20 DIAGNOSIS — N141 Nephropathy induced by other drugs, medicaments and biological substances: Secondary | ICD-10-CM | POA: Diagnosis not present

## 2016-08-20 DIAGNOSIS — R635 Abnormal weight gain: Secondary | ICD-10-CM | POA: Diagnosis not present

## 2016-08-21 ENCOUNTER — Ambulatory Visit (INDEPENDENT_AMBULATORY_CARE_PROVIDER_SITE_OTHER): Payer: Medicare Other | Admitting: Sports Medicine

## 2016-08-21 ENCOUNTER — Ambulatory Visit
Admission: RE | Admit: 2016-08-21 | Discharge: 2016-08-21 | Disposition: A | Discharge status: Home / Self Care | Payer: Medicare Other | Source: Ambulatory Visit | Attending: Sports Medicine | Admitting: Sports Medicine

## 2016-08-21 ENCOUNTER — Encounter: Payer: Self-pay | Admitting: Sports Medicine

## 2016-08-21 DIAGNOSIS — M25572 Pain in left ankle and joints of left foot: Secondary | ICD-10-CM | POA: Diagnosis not present

## 2016-08-21 DIAGNOSIS — M2140 Flat foot [pes planus] (acquired), unspecified foot: Secondary | ICD-10-CM | POA: Diagnosis not present

## 2016-08-21 DIAGNOSIS — M76829 Posterior tibial tendinitis, unspecified leg: Secondary | ICD-10-CM

## 2016-08-21 DIAGNOSIS — M7989 Other specified soft tissue disorders: Secondary | ICD-10-CM | POA: Diagnosis not present

## 2016-08-22 NOTE — Progress Notes (Signed)
Subjective:    Patient ID: Christian Parsons, male    DOB: [birth year], 70 y.o.   MRN: [id number]  HPI chief complaint: Left ankle pain  [name] is a very pleasant 70 year old male who comes in today complaining of 3 months of worsening left ankle pain and stiffness. He injured this ankle four years ago when he fell off of a ladder. A workup at that time did not reveal any sort of fracture. He was sent to physical therapy and given orthotics. He found those orthotics to be uncomfortable so has not worn them. He is an avid hiker and has began to notice pain in stiffness diffusely throughout the ankle with hiking. He will also get intermittent swelling around the ankle. He does get some discomfort up into his calf as well but denies any significant calf swelling. No calf pain. He denies numbness and tingling. He has also noticed that his leg will get somewhat weak with prolonged walking. This results with him having to "thrust" his leg for to continue walking. He denies any low back pain. No groin pain. He is unable to take NSAIDs. No prior ankle surgery.  Past medical history is reviewed Medications reviewed Allergies reviewed    Review of Systems As above    Objective:   Physical Exam  Well-developed, well-nourished. No acute distress. Awake alert and oriented 3. Vital signs reviewed  Left ankle: Marked pes planus with standing. "Too many toes" sign. There is an absence of calcaneal inversion when standing on his tiptoes. Mild diffuse soft tissue swelling. Slight tenderness to palpation along the posterior tibialis tendon. Good range of motion. Ankle is stable to anterior drawer and talar tilt testing. Deltoid ligament is intact. Good subtalar motion. Good pulses.  Left calf: Negative Homans. No obvious swelling. No tenderness to palpation.  Neurological exam: 4/5 strength with resisted hip flexion on the left. 5/5 on the right. Good abductor strength. Remainder of his strength is 5/5 in  both lower extremities. He has a trace patellar reflex on the left compared to 2/4 on the right. Achilles reflexes are 1/4 bilaterally. No atrophy. There is some decreased sensation to light touch along the medial ankle on the left compared to the right.  Evaluation of his gait shows severe pronation, left greater than right. He also has to slightly exaggerate his knee flexion on the left due to his hip weakness. I do not appreciate any obvious leg length discrepancy.  X-rays of the left ankle shows some mild soft tissue swelling but are otherwise unremarkable. No significant degenerative changes are seen. Nothing acute.      Assessment & Plan:   Left ankle pain and swelling secondary to posterior tibialis tendon insufficiency Left leg weakness possibly secondary to lumbar degenerative disc disease  I'm going to start with given the patient a pair of green sports insoles and scaphoid pads for arch support. He will wear these in his hiking shoes and hiking boots. If he finds those to be comfortable, we can consider custom orthotics but I think that the custom orthotics that were previously made for him may have been done by Dr. Rip Harbour. I would also like for him to start some physical therapy with Grasyn Cosse to work on strengthening his left hip. He will follow-up with me in 4 weeks. If symptoms persist or worsen then we may need to evaluate his lumbar spine to rule out lumbar disc pathology or possible spinal stenosis. Patient is encouraged to call me with  questions or concerns in the interim.

## 2016-09-04 ENCOUNTER — Telehealth: Payer: Self-pay

## 2016-09-04 NOTE — Telephone Encounter (Signed)
Pt needs you to call Dr. Corinda Gubler about you becoming his PCP.  The doctors number is [phone].  Pt's number is [phone].  Pt says this doctor is leaving private practice very soon and that this needs to be done immediately.  He got upset with me for trying to find out any details and said he did not want to talk to me about it.   He said this was supposed to be done 2 months ago.

## 2016-09-05 DIAGNOSIS — R531 Weakness: Secondary | ICD-10-CM | POA: Diagnosis not present

## 2016-09-05 NOTE — Telephone Encounter (Signed)
See my chart note - pt wants you to call his current doctor to become the PCP.  To Judson Roch.

## 2016-09-06 NOTE — Telephone Encounter (Signed)
I am not a psychiatrist like Dr Caprice Beaver and it looks like Dr Tamala Julian is his PCP.  Did someone in the community refer him to me - I think that it would be best if he found another psychiatrist and I am sure Dr Caprice Beaver will help with that.

## 2016-09-07 ENCOUNTER — Ambulatory Visit: Payer: Medicare Other

## 2016-09-10 NOTE — Telephone Encounter (Signed)
LMOM for pt to CB to ask him ?s from Judson Roch.

## 2016-09-12 NOTE — Telephone Encounter (Signed)
This message was for Dr. Tamala Julian and I spoke to her about it personally.

## 2016-09-12 NOTE — Telephone Encounter (Signed)
Spoke to Jasper who said that she had spoken to Dr Tamala Julian about this the day message was first taken. Dr Tamala Julian, please see first message below from pt to you.

## 2016-09-13 NOTE — Telephone Encounter (Signed)
Left message on patient's voicemail. During patient's last visit, he requested that I manage his medications for bipolar.  I agreed to refilling medications as long as he remained stable. If he developed problems emotionally, we agreed that I would refer him to psychiatry. He advised me that he had one last follow-up with Dr. Caprice Beaver after his CPE with me in November. Not sure what his concern is regarding this issue.  Called to clarify with patient.

## 2016-09-16 ENCOUNTER — Encounter: Payer: Self-pay | Admitting: Family Medicine

## 2016-09-17 DIAGNOSIS — R531 Weakness: Secondary | ICD-10-CM | POA: Diagnosis not present

## 2016-09-18 ENCOUNTER — Ambulatory Visit: Payer: Medicare Other | Admitting: Sports Medicine

## 2016-09-25 ENCOUNTER — Ambulatory Visit (INDEPENDENT_AMBULATORY_CARE_PROVIDER_SITE_OTHER): Payer: Medicare Other | Admitting: Sports Medicine

## 2016-09-25 VITALS — BP 126/80 | Ht 72.0 in | Wt 235.0 lb

## 2016-09-25 DIAGNOSIS — R29898 Other symptoms and signs involving the musculoskeletal system: Secondary | ICD-10-CM | POA: Diagnosis not present

## 2016-09-25 DIAGNOSIS — M25572 Pain in left ankle and joints of left foot: Secondary | ICD-10-CM | POA: Diagnosis not present

## 2016-09-26 NOTE — Progress Notes (Signed)
   Subjective:    Patient ID: [name], male    DOB: 1945/12/24, 70 y.o.   MRN: [id number]  HPI   Mr. Pongratz comes in today for follow-up. He thinks that his left hip and leg weakness are improving with physical therapy. He feels like he is getting stronger. He really likes his green sports insoles and scaphoid pads. He is requesting two additional pair for other shoes.    Review of Systems     Objective:   Physical Exam  Well-developed, well-nourished. No acute distress  Neurological exam: Patient has 4+/5 strength with resisted hip flexion on the left which is an improvement over his previous exam. Remainder of his strength remains 5/5 in both lower extremities. His patellar reflex has also improved. It is now a 2/4 and equal to the patellar reflex on the right. Evaluation of his gait still shows some slightly exaggerated knee flexion due to his weakness but it is not as pronounced as his previous visit. He does have a Trendelenburg gait. No obvious leg length discrepancy.       Assessment & Plan:   Posterior tibialis tendon insufficiency Left leg weakness possibly secondary to lumbar degenerative disc disease  Patient is making improvement. We've given him 2 additional pairs of green sports insoles and scaphoid pads. His leg weakness has improved but not resolved. I want him to continue working with [facility] and follow-up with me again in 4 weeks. As long as he continues to improve I do not think I need to pursue diagnostic imaging. However, if his progress plateaus or transgresses then I would reconsider this.

## 2016-11-05 DIAGNOSIS — R531 Weakness: Secondary | ICD-10-CM | POA: Diagnosis not present

## 2016-11-26 DIAGNOSIS — R531 Weakness: Secondary | ICD-10-CM | POA: Diagnosis not present

## 2016-11-27 ENCOUNTER — Encounter: Payer: Self-pay | Admitting: Family Medicine

## 2016-11-27 ENCOUNTER — Ambulatory Visit (INDEPENDENT_AMBULATORY_CARE_PROVIDER_SITE_OTHER): Payer: Medicare Other | Admitting: Family Medicine

## 2016-11-27 VITALS — BP 125/76 | HR 72 | Temp 98.0°F | Ht 72.0 in | Wt 237.8 lb

## 2016-11-27 DIAGNOSIS — N182 Chronic kidney disease, stage 2 (mild): Secondary | ICD-10-CM

## 2016-11-27 DIAGNOSIS — G250 Essential tremor: Secondary | ICD-10-CM | POA: Diagnosis not present

## 2016-11-27 DIAGNOSIS — Z6831 Body mass index (BMI) 31.0-31.9, adult: Secondary | ICD-10-CM | POA: Diagnosis not present

## 2016-11-27 DIAGNOSIS — N401 Enlarged prostate with lower urinary tract symptoms: Secondary | ICD-10-CM

## 2016-11-27 DIAGNOSIS — N138 Other obstructive and reflux uropathy: Secondary | ICD-10-CM

## 2016-11-27 DIAGNOSIS — G43109 Migraine with aura, not intractable, without status migrainosus: Secondary | ICD-10-CM

## 2016-11-27 DIAGNOSIS — F3178 Bipolar disorder, in full remission, most recent episode mixed: Secondary | ICD-10-CM | POA: Diagnosis not present

## 2016-11-27 MED ORDER — LAMOTRIGINE 150 MG PO TABS: 150.0000 mg | ORAL_TABLET | Freq: Every day | ORAL | 1 refills | Status: DC

## 2016-11-27 NOTE — Patient Instructions (Signed)
     IF you received an x-ray today, you will receive an invoice from [facility]. Please contact [facility] at [phone] with questions or concerns regarding your invoice.   IF you received labwork today, you will receive an invoice from [facility]. Please contact [facility] at [phone] with questions or concerns regarding your invoice.   Our billing staff will not be able to assist you with questions regarding bills from these companies.  You will be contacted with the lab results as soon as they are available. The fastest way to get your results is to activate your My Chart account. Instructions are located on the last page of this paperwork. If you have not heard from us regarding the results in 2 weeks, please contact this office.     

## 2016-11-27 NOTE — Progress Notes (Signed)
     Patient ID: Christian Parsons, male    DOB: November 12, 1945, 71 y.o.   MRN: [id number]  PCP: Reginia Forts, MD  Chief Complaint  Patient presents with  . Follow-up    Subjective:   Presents for follow up for his Bi-Polar disorder. When asked how is he doing he states: He has some bad days but "I don't classify my bad say as outside the range of any who is bipolar". He had an ultrasound of his scrotal masses done in December with no change to previous US and was told follow up wasn't needed. 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 He says he has cut out alcohol and is planning to cut out coffee. Last saw urology in July, history of UTI which required two weeks of ciprofloxacin and he developed joint pain which he is seeing physical therapy for and his pain has improved considerably. Still suffers from migraines and takes which controls them.   Review of Systems  Musculoskeletal: Positive for arthralgias and back pain.  Psychiatric/Behavioral: Positive for agitation (Due to bipolar disorder).  All other systems reviewed and are negative.     Patient Active Problem List   Diagnosis Date Noted  . BPH with urinary obstruction 08/12/2016  . CKD (chronic kidney disease) stage 2 or early 3 07/11/2015  . Benign essential tremor 06/16/2013  . Migraine headache with aura 12/25/2011  . BMI 31.0-31.9,adult 12/25/2011  . Right bundle branch block 12/25/2011  . Bipolar disorder ([facility])      Prior to Admission medications   Medication Sig Start Date End Date Taking? Authorizing Provider  lamoTRIgine (LAMICTAL) 200 MG tablet Take 30 mg by mouth daily.     Historical Provider, MD  rizatriptan (MAXALT) 10 MG tablet TAKE 1 TABLET (10 MG TOTAL) BY MOUTH AS NEEDED. MAY REPEAT IN 2 HRS IF NEEDED. 08/11/16   Wardell Honour, MD     No Known Allergies     Objective:  Physical Exam    Constitutional: He is oriented to person, place, and time. He appears well-developed and well-nourished. He is active and cooperative.  Blood pressure 125/76, pulse 72, temperature 98 F (36.7 C), temperature source Oral, height 6' (1.829 m), weight 237 lb 12.8 oz (107.9 kg), SpO2 96 %.  HENT:  Head: Normocephalic.  Eyes: Pupils are equal, round, and reactive to light.  Neck: Trachea normal.  Cardiovascular: Normal rate, regular rhythm and normal heart sounds.   Pulmonary/Chest: Effort normal and breath sounds normal.  Lymphadenopathy:    He has no cervical adenopathy.    He has no axillary adenopathy.  Neurological: He is alert and oriented to person, place, and time.  Skin: Skin is warm and dry.  Psychiatric: He has a normal mood and affect. His speech is normal and behavior is normal. Judgment and thought content normal. Cognition and memory are normal.   Assessment & Plan:  1. Bipolar disorder, in full remission, most recent episode mixed ([facility]) - lamoTRIgine (LAMICTAL) 150 MG tablet; Take 1 tablet (150 mg total) by mouth daily.  Dispense: 90 tablet; Refill: 1  2. CKD (chronic kidney disease) stage 2 or early 3  3. BPH with urinary obstruction  4. Migraine with aura and without status migrainosus, not intractable  5. Benign essential tremor  6. BMI 31.0-31.9,adult

## 2016-11-27 NOTE — Progress Notes (Signed)
Subjective:    Patient ID: Christian Parsons, male    DOB: 1946/03/06, 71 y.o.   MRN: [id number]  11/27/2016  Medication Refill (Lamictal 150mg  daily)   HPI This 71 y.o. male presents for evaluation of bipololar disorder, hypertension, and chronic renal insufficiency secondary to lithium toxicity.  No changes to management made at last visit.  Migraines are not a problem.  Has decreased drinking alcohol 90%; must also limit coffee.  Urology every six months.  Scheduled for this summer.  Doing well emotionally; no issues at this time. Wife agrees with stable mood.  Denies manic episodes or severe depression.  Trying to hike as much as possible. Going for a hike today.  Denies SI.    Immunization History  Administered Date(s) Administered  . Influenza Split 07/24/2012  . Influenza,inj,Quad PF,36+ Mos 06/16/2013, 05/25/2014, 06/18/2015, 07/30/2016  . Pneumococcal Conjugate-13 07/30/2016  . Pneumococcal Polysaccharide-23 11/07/2010  . Td 01/15/2004  . Tdap 06/16/2013   BP Readings from Last 3 Encounters:  11/27/16 125/76  09/25/16 126/80  08/21/16 126/80   Wt Readings from Last 3 Encounters:  11/27/16 237 lb 12.8 oz (107.9 kg)  09/25/16 235 lb (106.6 kg)  08/21/16 235 lb (106.6 kg)    Review of Systems  Constitutional: Negative for activity change, appetite change, chills, diaphoresis, fatigue and fever.  Eyes: Negative for visual disturbance.  Respiratory: Negative for cough and shortness of breath.   Cardiovascular: Negative for chest pain, palpitations and leg swelling.  Endocrine: Negative for cold intolerance, heat intolerance, polydipsia, polyphagia and polyuria.  Neurological: Negative for dizziness, tremors, seizures, syncope, facial asymmetry, speech difficulty, weakness, light-headedness, numbness and headaches.    Past Medical History:  Diagnosis Date  . Anxiety   . Bipolar disorder ([place])    H/O  . BPH with urinary obstruction    s/p TURP  . Chronic kidney disease     . Chronic renal insufficiency, stage 2 (mild)    secondary to lithium toxicity; baseline creatinine 1.3-1.6  . Coronary artery disease   . Depression   . Hypertension    Past Surgical History:  Procedure Laterality Date  . HERNIA REPAIR    . PROSTATE SURGERY    . TRANSURETHRAL RESECTION OF PROSTATE     No Known Allergies Current Outpatient Prescriptions  Medication Sig Dispense Refill  . BABY ASPIRIN PO Take by mouth daily.     . rizatriptan (MAXALT) 10 MG tablet TAKE 1 TABLET (10 MG TOTAL) BY MOUTH AS NEEDED. MAY REPEAT IN 2 HRS IF NEEDED. 10 tablet 3  . lamoTRIgine (LAMICTAL) 150 MG tablet Take 1 tablet (150 mg total) by mouth daily. 90 tablet 1   No current facility-administered medications for this visit.    Social History   Social History  . Marital status: Married    Spouse name: N/A  . Number of children: 1  . Years of education: 16+   Occupational History  . retired     Music therapist   Social History Main Topics  . Smoking status: Never Smoker  . Smokeless tobacco: Never Used  . Alcohol use 1.2 oz/week    2 Standard drinks or equivalent per week  . Drug use: No  . Sexual activity: Yes    Partners: Female   Other Topics Concern  . Not on file   Social History Narrative   Exercise: walks 4-5 days per week also hikes 4 days per week 1-7 hours per day.     Advanced Directives; YES;  full code but no prolonged measures.   Family History  Problem Relation Age of Onset  . Cancer Mother     breast cancer  . Dementia Father   . Arthritis Sister        Objective:    BP 125/76 (BP Location: Right Arm, Patient Position: Sitting, Cuff Size: Large)   Pulse 72   Temp 98 F (36.7 C) (Oral)   Ht 6' (1.829 m)   Wt 237 lb 12.8 oz (107.9 kg)   SpO2 96%   BMI 32.25 kg/m  Physical Exam  Constitutional: He is oriented to person, place, and time. He appears well-developed and well-nourished. No distress.  HENT:  Head: Normocephalic and atraumatic.  Right  Ear: External ear normal.  Left Ear: External ear normal.  Nose: Nose normal.  Mouth/Throat: Oropharynx is clear and moist.  Eyes: Conjunctivae and EOM are normal. Pupils are equal, round, and reactive to light.  Neck: Normal range of motion. Neck supple. Carotid bruit is not present. No thyromegaly present.  Cardiovascular: Normal rate, regular rhythm, normal heart sounds and intact distal pulses.  Exam reveals no gallop and no friction rub.   No murmur heard. Pulmonary/Chest: Effort normal and breath sounds normal. He has no wheezes. He has no rales.  Abdominal: Soft. Bowel sounds are normal. He exhibits no distension and no mass. There is no tenderness. There is no rebound and no guarding.  Lymphadenopathy:    He has no cervical adenopathy.  Neurological: He is alert and oriented to person, place, and time. No cranial nerve deficit.  Skin: Skin is warm and dry. No rash noted. He is not diaphoretic.  Psychiatric: He has a normal mood and affect. His behavior is normal.  Nursing note and vitals reviewed.  Results for orders placed or performed in visit on 07/30/16  POCT urinalysis dipstick  Result Value Ref Range   Color, UA yellow yellow   Clarity, UA clear clear   Glucose, UA negative negative   Bilirubin, UA negative negative   Ketones, POC UA negative negative   Spec Grav, UA 1.010    Blood, UA small (A) negative   pH, UA 7.0    Protein Ur, POC negative negative   Urobilinogen, UA 0.2    Nitrite, UA Negative Negative   Leukocytes, UA Negative Negative       Assessment & Plan:   1. Bipolar disorder, in full remission, most recent episode mixed ([facility])   2. CKD (chronic kidney disease) stage 2 or early 3   3. BPH with urinary obstruction   4. Migraine with aura and without status migrainosus, not intractable   5. Benign essential tremor   6. BMI 31.0-31.9,adult    -controlled bipolar disorder; agreeable to managing rx as long as stable; will warrant psychiatry  consultation if becomes unstable.  Pt expresses understanding. Refill of Lamictal provided. -recent labs by nephrology thus does not warrant repeat labs today. -no recent migraines. -recommend weight loss, exercise, and low-calorie diet.    No orders of the defined types were placed in this encounter.  Meds ordered this encounter  Medications  . BABY ASPIRIN PO    Sig: Take by mouth daily.   Marland Kitchen lamoTRIgine (LAMICTAL) 150 MG tablet    Sig: Take 1 tablet (150 mg total) by mouth daily.    Dispense:  90 tablet    Refill:  1    Return in about 6 months (around 05/30/2017) for recheck bipolar, renal insufficiency.   Breniya Goertzen Hassell Done  Tamala Julian, M.D. Primary Care at [facility] [address] [place], [place]  [address] [phone] phone [phone] fax

## 2016-12-03 ENCOUNTER — Encounter: Payer: Self-pay | Admitting: Family Medicine

## 2016-12-09 DIAGNOSIS — R531 Weakness: Secondary | ICD-10-CM | POA: Diagnosis not present

## 2016-12-12 ENCOUNTER — Encounter: Payer: Self-pay | Admitting: Family Medicine

## 2016-12-23 DIAGNOSIS — R531 Weakness: Secondary | ICD-10-CM | POA: Diagnosis not present

## 2017-01-17 DIAGNOSIS — R531 Weakness: Secondary | ICD-10-CM | POA: Diagnosis not present

## 2017-02-14 DIAGNOSIS — R531 Weakness: Secondary | ICD-10-CM | POA: Diagnosis not present

## 2017-02-20 ENCOUNTER — Encounter: Payer: Self-pay | Admitting: Sports Medicine

## 2017-02-20 ENCOUNTER — Ambulatory Visit
Admission: RE | Admit: 2017-02-20 | Discharge: 2017-02-20 | Disposition: A | Discharge status: Home / Self Care | Payer: Medicare Other | Source: Ambulatory Visit | Attending: Sports Medicine | Admitting: Sports Medicine

## 2017-02-20 ENCOUNTER — Ambulatory Visit (INDEPENDENT_AMBULATORY_CARE_PROVIDER_SITE_OTHER): Payer: Medicare Other | Admitting: Sports Medicine

## 2017-02-20 VITALS — BP 132/90 | Ht 72.0 in | Wt 235.0 lb

## 2017-02-20 DIAGNOSIS — M1712 Unilateral primary osteoarthritis, left knee: Secondary | ICD-10-CM | POA: Diagnosis not present

## 2017-02-20 DIAGNOSIS — M25562 Pain in left knee: Secondary | ICD-10-CM | POA: Diagnosis not present

## 2017-02-21 NOTE — Progress Notes (Signed)
   Subjective:    Patient ID: Christian Parsons, male    DOB: [birth year], 71 y.o.   MRN: [id number]  HPI chief complaint: Left knee pain  Patient comes in today complaining of 1 month of medial sided left knee pain. He thinks his symptoms began after doing some leg exercises at the gym. Shortly afterwards he began to notice a gradual onset of pain which is limiting his ability to walk. He is an avid hiker and has a long hike planned in about a month. He has not noticed any swelling but he does endorse stiffness with prolonged sitting. The symptoms have improved somewhat but certainly not resolved. No mechanical symptoms. No problems with his knee in the past. No prior knee surgeries. He has a history of severe pes planus secondary to posterior tibialis tendon insufficiency. He has scaphoid pads in his hiking boots but does not have them in his shoes today. He denies numbness or tingling. No  treatment to date. No imaging has been done. Dr. Candelaria Celeste is in his hiking group and told him that he may have pes anserine bursitis.  Interim medical history reviewed Medications reviewed Allergies reviewed    Review of Systems    as above Objective:   Physical Exam  Well-developed, well-nourished. No acute distress. Awake alert and oriented 3.  Left knee: Full range of motion. Trace effusion. He is tender to palpation along the medial joint line as well as some tenderness at the pes anserine bursa. Negative McMurray's. Mildly positive Thessaly's. Negative patellar grind. Knee is stable to valgus and varus stressing. Severe pes planus with standing. Neurovascularly intact distally. Walking with only a slight limp.  MSK ultrasound of the left knee was performed. Limited images were obtained. Patient has a small joint effusion and some slight spurring of the medial joint space. There is also some extrusion of the medial meniscus but no discrete tear is seen. Lateral meniscus is unremarkable. No fluid seen at  the insertion of the tendons at the pes anserine bursa.  X-rays of the left knee show moderate narrowing of the medial compartment with 30 flexion but otherwise looked pretty good.      Assessment & Plan:   Left knee pain and swelling likely secondary to medial compartment DJD  Although the patient does have some tenderness to palpation at the pes anserine bursa, his ultrasound does not suggest any abnormality here. In addition, he seems to be more tender to palpation along the medial joint line and he does have ultrasound changes consistent with medial meniscal bulging and x-ray findings consistent with moderate medial compartmental DJD. I discussed treatment with him including a cortisone injection. We've decided to try a body helix compression sleeve with activity and I've given him a new pair of green sports insoles and scaphoid pads for him to wear in his current shoes. He will continue with the scaphoid pads in his hiking boots and he will follow-up with me in 3 weeks for reevaluation. I've encouraged icing after activity and I've educated him in isometric quad exercises. If his symptoms persist or worsen we will re-consider the merits of a diagnostic/therapeutic intra-articular cortisone injection. Patient will call with questions or concerns prior to his follow-up visit.

## 2017-02-23 ENCOUNTER — Encounter: Payer: Self-pay | Admitting: Sports Medicine

## 2017-03-04 DIAGNOSIS — T56891A Toxic effect of other metals, accidental (unintentional), initial encounter: Secondary | ICD-10-CM | POA: Diagnosis not present

## 2017-03-04 DIAGNOSIS — E785 Hyperlipidemia, unspecified: Secondary | ICD-10-CM | POA: Diagnosis not present

## 2017-03-04 DIAGNOSIS — N141 Nephropathy induced by other drugs, medicaments and biological substances: Secondary | ICD-10-CM | POA: Diagnosis not present

## 2017-03-04 DIAGNOSIS — N400 Enlarged prostate without lower urinary tract symptoms: Secondary | ICD-10-CM | POA: Diagnosis not present

## 2017-03-04 DIAGNOSIS — N183 Chronic kidney disease, stage 3 (moderate): Secondary | ICD-10-CM | POA: Diagnosis not present

## 2017-03-04 DIAGNOSIS — I129 Hypertensive chronic kidney disease with stage 1 through stage 4 chronic kidney disease, or unspecified chronic kidney disease: Secondary | ICD-10-CM | POA: Diagnosis not present

## 2017-03-11 ENCOUNTER — Ambulatory Visit (INDEPENDENT_AMBULATORY_CARE_PROVIDER_SITE_OTHER): Payer: Medicare Other | Admitting: Sports Medicine

## 2017-03-11 ENCOUNTER — Encounter: Payer: Self-pay | Admitting: Sports Medicine

## 2017-03-11 VITALS — BP 120/80 | Ht 72.0 in | Wt 235.0 lb

## 2017-03-11 DIAGNOSIS — M25562 Pain in left knee: Secondary | ICD-10-CM | POA: Diagnosis not present

## 2017-03-11 DIAGNOSIS — M25569 Pain in unspecified knee: Secondary | ICD-10-CM | POA: Insufficient documentation

## 2017-03-11 NOTE — Progress Notes (Signed)
   Subjective:    Patient ID: Christian Parsons, male    DOB: 19-Dec-1945, 71 y.o.   MRN: [id number]  HPI History Christian Parsons is a 71 year old male presenting today for left knee pain follow-up. Last office visit 02/20/2017 for left knee pain. Ultrasound at that time showed small joint effusion with some extrusion of the medial meniscus but no discrete tear. Declined injection at that time. Body helix compression sleeve given along with green sports insoles and scaphoid pads. Quad exercises also given. Report since last office visit left knee pain has changed. Pain noted at last office visit has improved significantly with the body helix sleeve and leg lifts. Now notes pain along the medial superior knee and posterior knee. Reports frustration since he previously enjoyed long hikes, now it is difficult for him to even hiked 3 miles. Has not been using any pain medications.   ROS/HPI:  Denies giving out or locking. Denies knee swelling, occasionally notes ankle and calf swelling.  Review of Systems History of present illness    Objective:   Physical Exam  Constitutional: He appears well-developed and well-nourished. No distress.  Cardiovascular: Normal rate.   Pulmonary/Chest: Effort normal. No respiratory distress. He has no wheezes.  Musculoskeletal:  Knee: No effusion noted. Range of motion symmetric bilaterally. No joint line tenderness. Mild tenderness over left VMO. No tenderness over patellar or quad tendon bilaterally. Possible Baker's cyst and left posterior knee with tenderness. Positive bounce home test on left. Positive Thessaly's on left. Pes planus bilaterally with posterior tibialis insufficiency.  Psychiatric: He has a normal mood and affect. His behavior is normal.   BP 120/80   Ht 6' (1.829 m)   Wt 235 lb (106.6 kg)   BMI 31.87 kg/m   Feet - marked pronation on left with drop of mid foot/ loss of long arch RT shows mod. Pronation with loss of long arch    Assessment & Plan:  1.  Acute pain of left knee Suspect correlated to pes planus and pronation Recommend custom orthotics for better support, especially while hiking. Continue body helix compression sleeve. Exercises given, including one leg mini squats, wall squats, leg lifts, lateral leg lifts, and side step ups. Follow up for custom orthotics.  I observed and examined the patient with the resident and agree with assessment and plan.  Note reviewed and modified by me. Stefanie Libel, MD

## 2017-03-11 NOTE — Patient Instructions (Addendum)
Please schedule an appointment to have custom orthotics made. Bring your hiking boots to this appointment. Continue to wear your knee sleeve. Please do the following exercises:  One Leg Mini Squats- stand one leg and bend knee, do mini squat back down and back up. May hold onto support.  Wall Squats- Mini squat against a wall  Leg Lifts: Forwards and Sideways.  Side Steps Ups

## 2017-03-11 NOTE — Assessment & Plan Note (Signed)
HEP  Place into custom orthotics  Cont to use compression  Consider weight loss

## 2017-03-13 ENCOUNTER — Encounter: Payer: Self-pay | Admitting: Sports Medicine

## 2017-03-13 ENCOUNTER — Ambulatory Visit (INDEPENDENT_AMBULATORY_CARE_PROVIDER_SITE_OTHER): Payer: Medicare Other | Admitting: Sports Medicine

## 2017-03-13 DIAGNOSIS — M2140 Flat foot [pes planus] (acquired), unspecified foot: Secondary | ICD-10-CM | POA: Insufficient documentation

## 2017-03-13 DIAGNOSIS — M2142 Flat foot [pes planus] (acquired), left foot: Secondary | ICD-10-CM | POA: Diagnosis not present

## 2017-03-13 DIAGNOSIS — M2141 Flat foot [pes planus] (acquired), right foot: Secondary | ICD-10-CM

## 2017-03-13 NOTE — Progress Notes (Signed)
  Christian Parsons - 71 y.o. male MRN [id number]  Date of birth: [birth year]  SUBJECTIVE:  Including CC & ROS.  HE:RDEY knee pain  Presents for custom orthotics because of overpronation and flat feet that is causing medial knee pain and thigh pain. He is an avid hiker and has been unable to hike due to the knee pain. He presents for custom orthotics today and brought his hiking boots.  He has been doing his exercises on a regular basis.   ROS: No unexpected weight loss, fever, chills, swelling, instability, muscle pain, numbness/tingling, redness, otherwise see HPI   PMHx - Updated and reviewed.  Contributory factors include: CK D, bipolar disorder, obesity, essential tremor, migraines PSHx - Updated and reviewed.  Contributory factors include:   none FHx - Updated and reviewed.  Contributory factors include:  Negative Social Hx - Updated and reviewed. Contributory factors include: He is an avid hiker Medications - reviewed   DATA REVIEWED: Previous office visits with Green insoles and ultrasound  PHYSICAL EXAM:  VS: BP:126/82  HR: bpm  TEMP: ( )  RESP:   HT:6' (182.9 cm)   WT:235 lb (106.6 kg)  BMI:31.9 PHYSICAL EXAM: Gen: NAD, alert, cooperative with exam, well-appearing HEENT: clear conjunctiva,  CV:  no edema, capillary refill brisk, normal rate Resp: non-labored Skin: no rashes, normal turgor  Neuro: no gross deficits.  Psych:  alert and oriented  Ankle & Foot: No visible swelling, ecchymosis, erythema, ulcers, calluses, blister Arch:pes planus, left greater than right hind foot valgus no inversion of left foot upon standing on toes but he does have minimal on right foot No pain at MT heads No pain at base of 5th MT; No tenderness over cuboid; No tenderness over N spot or navicular prominence No tenderness on lateral and medial malleolus No sign of peroneal tendon subluxations or tenderness to palpation Full in plantarflexion, dorsiflexion, inversion, and eversion of the  foot; flexion and extension of the toes Strength: 5/5 in all directions. Sensation: intact Vascular: intact w/ dorsalis pedis & posterior tibialis pulses 2+    ASSESSMENT & PLAN:   Pes planus Custom orthotics made. Follow-up in 4-6 weeks to see if these help improve his knee pain. At that time would consider additional padding if still having discomfort.  He was feeling slightly better after the custom orthotics were made    Patient was fitted for a standard, cushioned, semi-rigid orthotic. The orthotic was heated and afterward the patient stood on the orthotic blank positioned on the orthotic stand. The patient was positioned in subtalar neutral position and 10 degrees of ankle dorsiflexion in a weight bearing stance. After completion of molding, a stable base was applied to the orthotic blank. The blank was ground to a stable position for weight bearing. Size: 13 Base: Blue EVA Additional Posting and Padding: left medial heel wedge The patient ambulated these, and they were very comfortable.  I spent 40 minutes with this patient, greater than 50% was face-to-face time counseling regarding the below diagnosis.

## 2017-03-13 NOTE — Assessment & Plan Note (Signed)
Custom orthotics made. Follow-up in 4-6 weeks to see if these help improve his knee pain. At that time would consider additional padding if still having discomfort.  He was feeling slightly better after the custom orthotics were made

## 2017-03-28 ENCOUNTER — Encounter: Payer: Self-pay | Admitting: Family Medicine

## 2017-03-28 ENCOUNTER — Ambulatory Visit (INDEPENDENT_AMBULATORY_CARE_PROVIDER_SITE_OTHER): Payer: Medicare Other | Admitting: Family Medicine

## 2017-03-28 VITALS — BP 136/81 | HR 68 | Temp 98.8°F | Resp 18 | Ht 71.26 in | Wt 243.0 lb

## 2017-03-28 DIAGNOSIS — F3178 Bipolar disorder, in full remission, most recent episode mixed: Secondary | ICD-10-CM

## 2017-03-28 DIAGNOSIS — N5203 Combined arterial insufficiency and corporo-venous occlusive erectile dysfunction: Secondary | ICD-10-CM | POA: Diagnosis not present

## 2017-03-28 DIAGNOSIS — N182 Chronic kidney disease, stage 2 (mild): Secondary | ICD-10-CM

## 2017-03-28 DIAGNOSIS — M5432 Sciatica, left side: Secondary | ICD-10-CM

## 2017-03-28 MED ORDER — TRAMADOL HCL 50 MG PO TABS: 50.0000 mg | ORAL_TABLET | Freq: Three times a day (TID) | ORAL | 0 refills | Status: DC | PRN

## 2017-03-28 MED ORDER — GABAPENTIN 100 MG PO CAPS: 100.0000 mg | ORAL_CAPSULE | Freq: Every day | ORAL | 0 refills | Status: DC

## 2017-03-28 MED ORDER — SILDENAFIL CITRATE 100 MG PO TABS: 50.0000 mg | ORAL_TABLET | Freq: Every day | ORAL | 3 refills | Status: DC | PRN

## 2017-03-28 NOTE — Progress Notes (Signed)
Subjective:    Patient ID: Christian Parsons, male    DOB: 10-13-1945, 71 y.o.   MRN: [id number]  03/28/2017  Leg Pain (left leg x months )   HPI This 71 y.o. male presents for evaluation of LEFT LEG PAIN.  LEFT leg pain; still having L leg pain that interferes with sleep every night. Completed ohysical therapy at the [facility] for two months.  Saw four different assistants during physical therapy.  The entire thing was quit physical therapy.   Went back to Dr. Micheline Chapman.  Did very simple leg raises. Also saw Dr. Oneida Alar.  Getting better again.  03/11/17 visit, 02/23/17 [place].  S/p LEFT knee xray by Micheline Chapman: +degenerative changes. Pain is intermittent.  Not daily.  Since going back to sports medicine.  Les frequent.  Discouraged.  During a week, was five days per week.  Now two days per week.  If hikes, by end of hike, in pain.  Will fall behind group.    L lateral side of leg; knee and medial hamstring area.  Really coming from lower back; radicular pain from nerve inflammation.  No medication.  Intermittent numbness/tingling/burning.  If sitting in a meeting; if gets up from sitting will have difficulty getting up.  Has taken Tylenol with some relief. Severity 5-6/10; Tylenol decreases to 3-4.   Now rarely waking up with pain.  Has been off of Lithium for one year ago.  Stopped October 2017.    Review of Systems  Constitutional: Negative for activity change, appetite change, chills, diaphoresis, fatigue and fever.  Eyes: Negative for visual disturbance.  Respiratory: Negative for cough and shortness of breath.   Cardiovascular: Negative for chest pain, palpitations and leg swelling.  Endocrine: Negative for cold intolerance, heat intolerance, polydipsia, polyphagia and polyuria.  Musculoskeletal: Positive for arthralgias ( ), back pain and myalgias.  Neurological: Positive for numbness. Negative for dizziness, tremors, seizures, syncope, facial asymmetry, speech difficulty, weakness, light-headedness  and headaches.    Past Medical History:  Diagnosis Date  . Anxiety   . Bipolar disorder ([place])    H/O  . BPH with urinary obstruction    s/p TURP  . Chronic kidney disease   . Chronic renal insufficiency, stage 2 (mild)    secondary to lithium toxicity; baseline creatinine 1.3-1.6  . Coronary artery disease   . Depression   . Hypertension    Past Surgical History:  Procedure Laterality Date  . HERNIA REPAIR    . PROSTATE SURGERY    . TRANSURETHRAL RESECTION OF PROSTATE     No Known Allergies  Social History   Social History  . Marital status: Married    Spouse name: N/A  . Number of children: 1  . Years of education: 16+   Occupational History  . retired     Music therapist   Social History Main Topics  . Smoking status: Never Smoker  . Smokeless tobacco: Never Used  . Alcohol use 1.2 oz/week    2 Standard drinks or equivalent per week  . Drug use: No  . Sexual activity: Yes    Partners: Female   Other Topics Concern  . Not on file   Social History Narrative   Exercise: walks 4-5 days per week also hikes 4 days per week 1-7 hours per day.     Advanced Directives; YES; full code but no prolonged measures.   Family History  Problem Relation Age of Onset  . Cancer Mother  breast cancer  . Dementia Father   . Arthritis Sister        Objective:    BP 136/81   Pulse 68   Temp 98.8 F (37.1 C) (Oral)   Resp 18   Ht 5' 11.26" (1.81 m)   Wt 243 lb (110.2 kg)   SpO2 98%   BMI 33.64 kg/m  Physical Exam  Constitutional: He is oriented to person, place, and time. He appears well-developed and well-nourished. No distress.  HENT:  Head: Normocephalic and atraumatic.  Eyes: Pupils are equal, round, and reactive to light. Conjunctivae and EOM are normal.  Neck: Normal range of motion. Neck supple. Carotid bruit is not present. No thyromegaly present.  Cardiovascular: Normal rate, regular rhythm, normal heart sounds and intact distal pulses.  Exam  reveals no gallop and no friction rub.   No murmur heard. Pulmonary/Chest: Effort normal and breath sounds normal. He has no wheezes. He has no rales.  Musculoskeletal:       Left hip: He exhibits decreased range of motion and decreased strength. He exhibits no tenderness and no bony tenderness.       Left knee: He exhibits decreased range of motion. He exhibits no swelling and no effusion. No tenderness found. No medial joint line and no lateral joint line tenderness noted.       Lumbar back: He exhibits normal range of motion, no tenderness, no bony tenderness, no swelling, no pain, no spasm and normal pulse.  Lymphadenopathy:    He has no cervical adenopathy.  Neurological: He is alert and oriented to person, place, and time. No cranial nerve deficit.  Skin: Skin is warm and dry. No rash noted. He is not diaphoretic.  Psychiatric: He has a normal mood and affect. His behavior is normal.  Nursing note and vitals reviewed.  Depression screen [facility] 2/9 03/28/2017 11/27/2016 08/21/2016 07/30/2016 03/18/2016  Decreased Interest 0 0 0 0 0  Down, Depressed, Hopeless 0 0 0 0 0  PHQ - 2 Score 0 0 0 0 0        Assessment & Plan:   1. Left sided sciatica   2. CKD (chronic kidney disease) stage 2 or early 3   3. Bipolar disorder, in full remission, most recent episode mixed ([place])    -persistent L sciatica; s/p sports medicine consultation. -avoid NSAIDS dueto CKD; rx for Tramadol and Gabapentin provided as pain is interfering with quality of life. -home exercise program provided. -rx for Viagra provided fro chronic ED.   No orders of the defined types were placed in this encounter.  Meds ordered this encounter  Medications  . traMADol (ULTRAM) 50 MG tablet    Sig: Take 1 tablet (50 mg total) by mouth every 8 (eight) hours as needed.    Dispense:  30 tablet    Refill:  0  . gabapentin (NEURONTIN) 100 MG capsule    Sig: Take 1-3 capsules (100-300 mg total) by mouth at bedtime.    Dispense:   45 capsule    Refill:  0  . sildenafil (VIAGRA) 100 MG tablet    Sig: Take 0.5-1 tablets (50-100 mg total) by mouth daily as needed for erectile dysfunction.    Dispense:  8 tablet    Refill:  3    No Follow-up on file.   Jaselynn Tamas Elayne Guerin, M.D. Primary Care at [facility] previously [facility] [address] [place], [place]  [address] [phone] phone [phone] fax

## 2017-03-28 NOTE — Patient Instructions (Signed)
     IF you received an x-ray today, you will receive an invoice from [facility]. Please contact [facility] at [phone] with questions or concerns regarding your invoice.   IF you received labwork today, you will receive an invoice from [facility]. Please contact [facility] at [phone] with questions or concerns regarding your invoice.   Our billing staff will not be able to assist you with questions regarding bills from these companies.  You will be contacted with the lab results as soon as they are available. The fastest way to get your results is to activate your My Chart account. Instructions are located on the last page of this paperwork. If you have not heard from us regarding the results in 2 weeks, please contact this office.     

## 2017-04-14 ENCOUNTER — Ambulatory Visit: Payer: Medicare Other | Admitting: Sports Medicine

## 2017-04-24 ENCOUNTER — Ambulatory Visit (INDEPENDENT_AMBULATORY_CARE_PROVIDER_SITE_OTHER): Payer: Medicare Other | Admitting: Sports Medicine

## 2017-04-24 VITALS — BP 128/80 | Ht 71.0 in | Wt 235.0 lb

## 2017-04-24 DIAGNOSIS — M1712 Unilateral primary osteoarthritis, left knee: Secondary | ICD-10-CM | POA: Diagnosis not present

## 2017-04-24 DIAGNOSIS — R29898 Other symptoms and signs involving the musculoskeletal system: Secondary | ICD-10-CM

## 2017-04-24 NOTE — Progress Notes (Signed)
  Christian Parsons - 71 y.o. male MRN [id number]  Date of birth: [birth year]  SUBJECTIVE:  Including CC & ROS.  Left leg pain and weakness  Patient reports that leg medial knee pain has improved since the addition of orthotics to his shoes. However, he has noticed that he is having left lateral leg pain after hiking that begins around his hip and radiates down his foot. He also feels weak in his left leg mostly in his quadriceps and hamstring. Reports that he occassionally has some numbness of the lateral side of his left foot. Associated with these symptoms is some mild back pain. These symptoms are improved when doing flutter kicking in the pool.   In addition to pain and weakness of his left lower ext, he has noticed swelling of this ext after exercising. He has not tried elevation or compression when having swelling.   Of note, patient has been doing lef extension and abduction exercises in addition to 1/4 squats which seem to help with strengthening but he does not do these everyday.   HISTORY: Past Medical, Surgical, Social, and Family History Reviewed & Updated per EMR.   Pertinent Historical Findings include: Pes Planus H/o TURP, h/o hernia repair  DATA REVIEWED: Previous office notes from Dr. Oneida Alar   PHYSICAL EXAM:  VS: BP:128/80  HR: bpm  TEMP: ( )  RESP:   HT:5\' 11"  (180.3 cm)   WT:235 lb (106.6 kg)  BMI:32.8 PHYSICAL EXAM: Lower ext: 1+ pitting edema of LLE to the level of mid shin. Trace edema on the RLE.   Left Hip: ROM IR: 45 Deg, ER: 45 Deg, Flexion: 120 Deg, Extension: 100 Deg, Abduction: 45 Deg, Adduction: 45 Deg Strength IR: 5/5, ER: 5/5, Flexion: 5/5, Extension: 5/5, Abduction: 5/5, Adduction: 5/5 Greater trochanter without tenderness to palpation. No pain with FABER or FADIR.  Left Knee Normal to inspection with no erythema or effusion or obvious bony abnormalities. Palpation normal with no warmth, joint line tenderness, patellar tenderness, or condyle  tenderness. ROM full in flexion and extension and lower leg rotation. Ligaments with solid consistent endpoints including ACL, PCL, LCL, MCL. Negative Mcmurray's Hamstring and quadriceps strength is normal.   Neuro: DTR:  Achilles: 1+ bilaterally  Patellar: 1+ on R, absent on L  Decreased sensation to lateral aspect of left foot Strength testing as above. 5/5 strength with plantar and dorsiflexion of left ankle Neg straight leg raise bilaterally   ASSESSMENT & PLAN: See problem based charting & AVS for pt instructions. Lumbar Radiculopathy  Venous insufficiency Improved left knee pain secondary to DJD  Patient with left thigh weakness after hiking in addition to swelling and lateral leg pain. Etiology of pain and weakness is likely from patient's lumbar spine. The evidence of lateral foot numbness make this more likely. At the time, I do not think that imaging is necessary and patient is in agreement with deferring imaging until pain, weakness, numbness progresses. I have given patient back strengthening and stretching exercises to do daily. Also advised that he should do quad and hamstring strengthening exercises daily.   Swelling of left lower ext likely venous insufficiency. Signs and symptoms inconsistent with DVT and patient with no erythema, TTP on exam today. Recommended that patient use compression stocking and will give him prescription for this.   Patient will RTC in 3-4 weeks for re-evaluation and another pair of custom orthotics for his hiking boots.

## 2017-04-25 DIAGNOSIS — Z8549 Personal history of malignant neoplasm of other male genital organs: Secondary | ICD-10-CM | POA: Diagnosis not present

## 2017-04-25 DIAGNOSIS — N400 Enlarged prostate without lower urinary tract symptoms: Secondary | ICD-10-CM | POA: Diagnosis not present

## 2017-04-25 DIAGNOSIS — R339 Retention of urine, unspecified: Secondary | ICD-10-CM | POA: Diagnosis not present

## 2017-04-25 DIAGNOSIS — N183 Chronic kidney disease, stage 3 (moderate): Secondary | ICD-10-CM | POA: Diagnosis not present

## 2017-05-15 ENCOUNTER — Ambulatory Visit (INDEPENDENT_AMBULATORY_CARE_PROVIDER_SITE_OTHER): Payer: Medicare Other | Admitting: Sports Medicine

## 2017-05-15 VITALS — BP 132/80 | Ht 72.0 in | Wt 235.0 lb

## 2017-05-15 DIAGNOSIS — M2141 Flat foot [pes planus] (acquired), right foot: Secondary | ICD-10-CM

## 2017-05-15 DIAGNOSIS — R29898 Other symptoms and signs involving the musculoskeletal system: Secondary | ICD-10-CM | POA: Diagnosis not present

## 2017-05-15 DIAGNOSIS — M2142 Flat foot [pes planus] (acquired), left foot: Secondary | ICD-10-CM

## 2017-05-16 NOTE — Progress Notes (Signed)
  Patient comes in today for a second pair of custom orthotics. His first pair are very comfortable. Overall, he's feeling pretty good. He still notices some weakness in his left leg when walking downhill. His pain is tolerable. He is taking Neurontin as needed at night. Also taking Tylenol which is helpful. He has some questions about which exercises he should be doing for his low back.  Custom orthotics were created for him today. He found them to be comfortable prior to leaving the office. I've given him a couple of flexion exercises for his lumbar spine. I think his symptoms are likely from spinal stenosis and I worry that extension exercises might make his symptoms worse. However, he is instructed to stop the flexion exercises if his symptoms worsen. He will continue with his generalized lower extremity strengthening and will follow-up with me in 4 weeks for reevaluation.  Total times in with the patient today was 30 minutes with greater than 50% of the time spent in face-to-face consultation discussing orthotic construction, instruction, and fitting. Also discussing his low back exercises.  Patient was fitted for a : standard, cushioned, semi-rigid orthotic. The orthotic was heated and afterward the patient stood on the orthotic blank positioned on the orthotic stand. The patient was positioned in subtalar neutral position and 10 degrees of ankle dorsiflexion in a weight bearing stance. After completion of molding, a stable base was applied to the orthotic blank. The blank was ground to a stable position for weight bearing. Size: 13 Base: Blue EVA Posting: none Additional orthotic padding: left medial heel wedge

## 2017-05-19 ENCOUNTER — Other Ambulatory Visit: Payer: Self-pay | Admitting: Family Medicine

## 2017-05-26 ENCOUNTER — Other Ambulatory Visit: Payer: Self-pay | Admitting: Family Medicine

## 2017-05-30 ENCOUNTER — Ambulatory Visit: Payer: Medicare Other | Admitting: Family Medicine

## 2017-06-02 NOTE — Progress Notes (Signed)
Subjective:    Patient ID: Christian Parsons, male    DOB: 19-Nov-1945, 71 y.o.   MRN: [id number]  06/03/2017  Hypertension (6 month follow-up); Depression; and Anxiety   HPI This 71 y.o. male presents for six month follow-up of bipolar disorder, renal insufficiency, BPH, L sciatica, renal insufficiency.  No changes to management made at last visit.    S/p recent follow-up with urology.  L leg is less swollen.  Has noticed lump posterior to knee L.  Worried about cancer.  Pain in posterior knee with standing up.  Lump is non-tender.    S/p orthotics by Dr. Micheline Chapman.   Good track with physical therapy.  If no satisfatctory with PT, will have another PT.  Dr. Oneida Alar also present at times.    Had a bad infection between surgeries; took Cipro for 19 days.  Has read that Cipro can cause joint issues.  Cannot understand why Cipro is cause.  Yesterday walked five miles.  Got tired coming back.  Hiking. Walking like had a PEG leg.  Ongoing.     Performing pt at home twice daily; now just seeing Dr. Micheline Chapman.  Referred to [place] at [facility].  Not impressed with [facility] physical therapy.  First four days had four different therapist; no record of previous therapy session.    Curious to know pulse this morning.    Seasonal affective disorder is much better in [place].  September through end of December is rough quarter. Has been taking extra Lamictal at 300mg  lately; nephrologist recommended not using Lithium regularly; usually taking twice monthly lately.  Wants to keep an eye on creatinine.  Did better on Lithium but doing well enough Lamictal.  Doubling up on Lamictal; 150mg  are scored so can 1/2.     BP Readings from Last 3 Encounters:  06/03/17 136/83  05/15/17 132/80  04/24/17 128/80   Wt Readings from Last 3 Encounters:  06/03/17 237 lb (107.5 kg)  05/15/17 235 lb (106.6 kg)  04/24/17 235 lb (106.6 kg)   Immunization History  Administered Date(s) Administered  . Influenza Split  07/24/2012  . Influenza,inj,Quad PF,6+ Mos 06/16/2013, 05/25/2014, 06/18/2015, 07/30/2016, 06/03/2017  . Pneumococcal Conjugate-13 07/30/2016  . Pneumococcal Polysaccharide-23 11/07/2010  . Td 01/15/2004  . Tdap 06/16/2013    Review of Systems  Constitutional: Negative for activity change, appetite change, chills, diaphoresis, fatigue and fever.  Respiratory: Negative for cough and shortness of breath.   Cardiovascular: Negative for chest pain, palpitations and leg swelling.  Gastrointestinal: Negative for abdominal pain, diarrhea, nausea and vomiting.  Endocrine: Negative for cold intolerance, heat intolerance, polydipsia, polyphagia and polyuria.  Musculoskeletal: Positive for arthralgias and back pain.  Skin: Negative for color change, rash and wound.  Neurological: Negative for dizziness, tremors, seizures, syncope, facial asymmetry, speech difficulty, weakness, light-headedness, numbness and headaches.  Psychiatric/Behavioral: Positive for dysphoric mood. Negative for sleep disturbance. The patient is not nervous/anxious.     Past Medical History:  Diagnosis Date  . Anxiety   . Bipolar disorder ([place])    H/O  . BPH with urinary obstruction    s/p TURP  . Chronic kidney disease   . Chronic renal insufficiency, stage 2 (mild)    secondary to lithium toxicity; baseline creatinine 1.3-1.6  . Coronary artery disease   . Depression   . Hypertension    Past Surgical History:  Procedure Laterality Date  . HERNIA REPAIR    . PROSTATE SURGERY    . TRANSURETHRAL RESECTION OF PROSTATE  No Known Allergies Current Outpatient Prescriptions  Medication Sig Dispense Refill  . BABY ASPIRIN PO Take by mouth daily.     Marland Kitchen gabapentin (NEURONTIN) 100 MG capsule Take 1-3 capsules (100-300 mg total) by mouth at bedtime. 45 capsule 0  . rizatriptan (MAXALT) 10 MG tablet TAKE 1 TABLET (10 MG TOTAL) BY MOUTH AS NEEDED. MAY REPEAT IN 2 HRS IF NEEDED. 10 tablet 3  . traMADol (ULTRAM) 50 MG  tablet Take 1 tablet (50 mg total) by mouth every 8 (eight) hours as needed. 30 tablet 0  . lamoTRIgine (LAMICTAL) 150 MG tablet Take 1 tablet (150 mg total) by mouth daily. 90 tablet 1   No current facility-administered medications for this visit.    Social History   Social History  . Marital status: Married    Spouse name: N/A  . Number of children: 1  . Years of education: 16+   Occupational History  . retired     Music therapist   Social History Main Topics  . Smoking status: Never Smoker  . Smokeless tobacco: Never Used  . Alcohol use 1.2 oz/week    2 Standard drinks or equivalent per week  . Drug use: No  . Sexual activity: Yes    Partners: Female   Other Topics Concern  . Not on file   Social History Narrative   Exercise: walks 4-5 days per week also hikes 4 days per week 1-7 hours per day.     Advanced Directives; YES; full code but no prolonged measures.   Family History  Problem Relation Age of Onset  . Cancer Mother        breast cancer  . Dementia Father   . Arthritis Sister        Objective:    BP 136/83   Pulse 65   Temp 97.9 F (36.6 C) (Oral)   Resp 16   Ht 5' 11.26" (1.81 m)   Wt 237 lb (107.5 kg)   SpO2 95%   BMI 32.81 kg/m  Physical Exam  Constitutional: He is oriented to person, place, and time. He appears well-developed and well-nourished. No distress.  HENT:  Head: Normocephalic and atraumatic.  Right Ear: External ear normal.  Left Ear: External ear normal.  Nose: Nose normal.  Mouth/Throat: Oropharynx is clear and moist.  Eyes: Pupils are equal, round, and reactive to light. Conjunctivae and EOM are normal.  Neck: Normal range of motion. Neck supple. Carotid bruit is not present. No thyromegaly present.  Cardiovascular: Normal rate, regular rhythm, normal heart sounds and intact distal pulses.  Exam reveals no gallop and no friction rub.   No murmur heard. Pulmonary/Chest: Effort normal and breath sounds normal. He has no  wheezes. He has no rales.  Abdominal: Soft. Bowel sounds are normal. He exhibits no distension and no mass. There is no tenderness. There is no rebound and no guarding.  Musculoskeletal:       Left knee: He exhibits swelling. No tenderness found. No medial joint line and no lateral joint line tenderness noted.       Lumbar back: He exhibits pain. He exhibits normal range of motion, no tenderness, no bony tenderness, no spasm and normal pulse.  L KNEE: firm area of swelling 5 cm x 5 cm posterior lateral knee left.  Non-tender to palpation.  Full extension and flexion of knee. Anterior drawer negative; Lachman's negative; McMurray's negative.  Lymphadenopathy:    He has no cervical adenopathy.  Neurological: He is alert and  oriented to person, place, and time. No cranial nerve deficit.  Skin: Skin is warm and dry. No rash noted. He is not diaphoretic.  Psychiatric: His speech is normal and behavior is normal. Judgment and thought content normal. Cognition and memory are normal. He exhibits a depressed mood.  Nursing note and vitals reviewed.  Results for orders placed or performed in visit on 07/30/16  POCT urinalysis dipstick  Result Value Ref Range   Color, UA yellow yellow   Clarity, UA clear clear   Glucose, UA negative negative   Bilirubin, UA negative negative   Ketones, POC UA negative negative   Spec Grav, UA 1.010    Blood, UA small (A) negative   pH, UA 7.0    Protein Ur, POC negative negative   Urobilinogen, UA 0.2    Nitrite, UA Negative Negative   Leukocytes, UA Negative Negative   No results found. Depression screen [facility] 2/9 06/03/2017 03/28/2017 11/27/2016 08/21/2016 07/30/2016  Decreased Interest 0 0 0 0 0  Down, Depressed, Hopeless 0 0 0 0 0  PHQ - 2 Score 0 0 0 0 0   Fall Risk  06/03/2017 04/24/2017 03/28/2017 11/27/2016 08/21/2016  Falls in the past year? No No No No No  Risk for fall due to : - - - - Other (Comment)        Assessment & Plan:   1. CKD (chronic kidney  disease) stage 2 or early 3   2. Bipolar disorder, in full remission, most recent episode mixed ([place])   3. Migraine with aura and without status migrainosus, not intractable   4. Need for prophylactic vaccination and inoculation against influenza   5. Left sided sciatica   6. Pain and swelling of left knee    -persistent L sciatica for the past year which is negatively impacting quality of life and ability to exercise; obtain LS spine films; also recommend contacting Dr. Micheline Chapman to request new physical therapy referral as pt was very disappointed with physical therapy at local [facility] facility (disorganized and no continuity with one therapist).  If no improvement with physical therapy, recommend orthopedic consultation versus MRI lumbar spine.   -suffering with worsening depression due to ongoing L sciatica issues; exercise is mainstay of patient's treatment and unable to exercise as often and as long due to L sciatica and L knee pathology; continue current treatment plan; pt has been augmenting Lamictal with Lithium to help with depression; consider referral to psychiatry if declines further; close follow-up. -chronic renal insufficiency due to lithium toxicity; followed by urology and nephrology; repeat Creatinine today as has been using lithium twice monthly.   -new onset L posterior knee swelling; obtain doppler; most consistent with Baker's cyst.     Orders Placed This Encounter  Procedures  . DG Lumbar Spine Complete    Standing Status:   Future    Number of Occurrences:   1    Standing Expiration Date:   06/03/2018    Order Specific Question:   Reason for Exam (SYMPTOM  OR DIAGNOSIS REQUIRED)    Answer:   L sciatica pain with walking    Order Specific Question:   Preferred imaging location?    Answer:   External  . Flu Vaccine QUAD 36+ mos IM  . CBC with Differential/Platelet  . Comprehensive metabolic panel   No orders of the defined types were placed in this encounter.   Return in  about 3 months (around 09/02/2017) for complete physical examiniation.   Jyla Hopf  Elayne Guerin, M.D. Primary Care at [facility] [address] [place], [place]  [phone] phone [phone] fax

## 2017-06-03 ENCOUNTER — Ambulatory Visit (INDEPENDENT_AMBULATORY_CARE_PROVIDER_SITE_OTHER): Payer: Medicare Other

## 2017-06-03 ENCOUNTER — Ambulatory Visit (INDEPENDENT_AMBULATORY_CARE_PROVIDER_SITE_OTHER): Payer: Medicare Other | Admitting: Family Medicine

## 2017-06-03 ENCOUNTER — Encounter: Payer: Self-pay | Admitting: Family Medicine

## 2017-06-03 VITALS — BP 136/83 | HR 65 | Temp 97.9°F | Resp 16 | Ht 71.26 in | Wt 237.0 lb

## 2017-06-03 DIAGNOSIS — M5432 Sciatica, left side: Secondary | ICD-10-CM

## 2017-06-03 DIAGNOSIS — G43109 Migraine with aura, not intractable, without status migrainosus: Secondary | ICD-10-CM

## 2017-06-03 DIAGNOSIS — M25562 Pain in left knee: Secondary | ICD-10-CM

## 2017-06-03 DIAGNOSIS — Z23 Encounter for immunization: Secondary | ICD-10-CM

## 2017-06-03 DIAGNOSIS — N182 Chronic kidney disease, stage 2 (mild): Secondary | ICD-10-CM

## 2017-06-03 DIAGNOSIS — M25462 Effusion, left knee: Secondary | ICD-10-CM

## 2017-06-03 DIAGNOSIS — F3178 Bipolar disorder, in full remission, most recent episode mixed: Secondary | ICD-10-CM

## 2017-06-03 DIAGNOSIS — M5116 Intervertebral disc disorders with radiculopathy, lumbar region: Secondary | ICD-10-CM | POA: Diagnosis not present

## 2017-06-03 LAB — COMPREHENSIVE METABOLIC PANEL
ALBUMIN: 4.6 g/dL (ref 3.6–4.8)
ALK PHOS: 83 IU/L (ref 39–117)
ALT: 25 IU/L (ref 0–44)
AST: 24 IU/L (ref 0–40)
Albumin/Globulin Ratio: 1.9 (ref 1.2–2.2)
BUN / CREAT RATIO: 15 (ref 10–24)
BUN: 26 mg/dL (ref 8–27)
Bilirubin Total: 0.7 mg/dL (ref 0.0–1.2)
CALCIUM: 9.5 mg/dL (ref 8.6–10.2)
CO2: 21 mmol/L (ref 20–29)
CREATININE: 1.71 mg/dL — AB (ref 0.76–1.27)
Chloride: 106 mmol/L (ref 96–106)
GFR calc Af Amer: 46 mL/min/{1.73_m2} — ABNORMAL LOW (ref >59)
GFR, EST NON AFRICAN AMERICAN: 40 mL/min/{1.73_m2} — AB (ref >59)
Globulin, Total: 2.4 g/dL (ref 1.5–4.5)
Glucose: 87 mg/dL (ref 65–99)
Potassium: 4.4 mmol/L (ref 3.5–5.2)
Sodium: 142 mmol/L (ref 134–144)
TOTAL PROTEIN: 7 g/dL (ref 6.0–8.5)

## 2017-06-03 LAB — CBC WITH DIFFERENTIAL/PLATELET
BASOS: 0 %
Basophils Absolute: 0 10*3/uL (ref 0.0–0.2)
EOS (ABSOLUTE): 0.1 10*3/uL (ref 0.0–0.4)
EOS: 2 %
HEMOGLOBIN: 15.3 g/dL (ref 13.0–17.7)
Hematocrit: 45.9 % (ref 37.5–51.0)
IMMATURE GRANS (ABS): 0 10*3/uL (ref 0.0–0.1)
IMMATURE GRANULOCYTES: 0 %
LYMPHS: 20 %
Lymphocytes Absolute: 1.1 10*3/uL (ref 0.7–3.1)
MCH: 31.5 pg (ref 26.6–33.0)
MCHC: 33.3 g/dL (ref 31.5–35.7)
MCV: 94 fL (ref 79–97)
Monocytes Absolute: 0.6 10*3/uL (ref 0.1–0.9)
Monocytes: 11 %
NEUTROS PCT: 67 %
Neutrophils Absolute: 3.6 10*3/uL (ref 1.4–7.0)
Platelets: 177 10*3/uL (ref 150–379)
RBC: 4.86 x10E6/uL (ref 4.14–5.80)
RDW: 14 % (ref 12.3–15.4)
WBC: 5.4 10*3/uL (ref 3.4–10.8)

## 2017-06-03 NOTE — Patient Instructions (Signed)
     IF you received an x-ray today, you will receive an invoice from [facility]. Please contact [facility] at [phone] with questions or concerns regarding your invoice.   IF you received labwork today, you will receive an invoice from [facility]. Please contact [facility] at [phone] with questions or concerns regarding your invoice.   Our billing staff will not be able to assist you with questions regarding bills from these companies.  You will be contacted with the lab results as soon as they are available. The fastest way to get your results is to activate your My Chart account. Instructions are located on the last page of this paperwork. If you have not heard from us regarding the results in 2 weeks, please contact this office.     

## 2017-06-04 ENCOUNTER — Encounter: Payer: Self-pay | Admitting: Sports Medicine

## 2017-06-05 ENCOUNTER — Encounter: Payer: Self-pay | Admitting: Family Medicine

## 2017-06-05 ENCOUNTER — Other Ambulatory Visit: Payer: Self-pay | Admitting: Family Medicine

## 2017-06-05 DIAGNOSIS — M79605 Pain in left leg: Secondary | ICD-10-CM

## 2017-06-05 DIAGNOSIS — M79604 Pain in right leg: Secondary | ICD-10-CM

## 2017-06-06 ENCOUNTER — Ambulatory Visit (HOSPITAL_COMMUNITY)
Admission: RE | Admit: 2017-06-06 | Discharge: 2017-06-06 | Disposition: A | Discharge status: Home / Self Care | Payer: Medicare Other | Source: Ambulatory Visit | Attending: Cardiovascular Disease | Admitting: Cardiovascular Disease

## 2017-06-06 DIAGNOSIS — M79605 Pain in left leg: Secondary | ICD-10-CM | POA: Insufficient documentation

## 2017-06-06 DIAGNOSIS — M7989 Other specified soft tissue disorders: Secondary | ICD-10-CM | POA: Diagnosis not present

## 2017-06-10 ENCOUNTER — Ambulatory Visit (INDEPENDENT_AMBULATORY_CARE_PROVIDER_SITE_OTHER): Payer: Medicare Other | Admitting: Sports Medicine

## 2017-06-10 ENCOUNTER — Encounter: Payer: Self-pay | Admitting: Sports Medicine

## 2017-06-10 VITALS — BP 120/70 | Ht 72.0 in | Wt 235.0 lb

## 2017-06-10 DIAGNOSIS — M5416 Radiculopathy, lumbar region: Secondary | ICD-10-CM | POA: Diagnosis not present

## 2017-06-11 NOTE — Progress Notes (Signed)
Subjective:    Patient ID: Christian Parsons, male    DOB: Mar 08, 1946, 71 y.o.   MRN: [id number]  HPI   Christian Parsons comes in today for follow-up on left leg pain. Since his last office visit, he's been diagnosed with a Baker's cyst via Doppler to rule out a DVT. Although he does endorse some pain and swelling around the knee, especially the posterior knee, his main complaint continues to be pain that begins in the posterior left hip and will radiate down the left thigh into the anterior medial lower leg, foot, and ankle. His symptoms are worse with prolonged walking. He has been unable to return to his previous level of hiking. He was on a 7 mile hike a few days ago and his pain became severe. He endorses numbness and tingling that will begin at the knee initially and then radiate into the foot and ankle as well as more proximally into the hip. Recent x-rays of his lumbar spine did show some mild degenerative disc disease but nothing acute. He's been taking gabapentin 100 mg daily at bedtime and Tylenol. He's also been using ice which he has found tremendously helpful. He does not have any pain when exercising in the pool. He denies groin pain. Although he denies weakness he does endorse fatigue. He gets occasional pain down the right leg as well although it is not as severe as the left. His orthotics are comfortable. He has been very compliant with his home exercises.   Review of Systems    as above Objective:   Physical Exam  Well-developed, well-nourished. No acute distress  Lumbar spine: Good lumbar range of motion. There is no tenderness to palpation along the lower lumbar spine.  Neurological exam: 4+/5 strength with resisted hip flexion on the left compared to 5/5 on the right. Remainder of his strength is 5/5 bilaterally. 2/4 patellar reflex on the right, absent on the left. Trace but equal Achilles reflexes bilaterally. Sensation is intact to light touch grossly. Equivocal straight leg  raise.  Left knee: Good range of motion. No effusion. There is a palpable Baker's cyst in the popliteal fossa. No calf swelling.  X-rays of the lumbar spine is as above.      Assessment & Plan:   Worsening left leg pain and numbness likely secondary to spinal stenosis versus lumbar disc herniation Degenerative disc disease, lumbar spine Baker's cyst, left knee  Patient definitely has two different issues. The primary issue is the pain, numbness, and fatigue in the left leg with activity. He's failed several weeks of home physical therapy as well as other forms of conservative treatment. At this point in time we need to get an MRI of his lumbar spine specifically to rule out spinal stenosis and to see if he has any pathology that may be amendable to a trial of epidural steroid injections. He is asking about a return to physical therapy  but I want to review the MRI of his lumbar spine first since he has not improved with either formal physical therapy or home exercises over the past several weeks. Phone follow-up with the results of the MRI when available. We will delineate further treatment based on those results. In the meantime, I've encouraged him to stay as active as possible. He will also continue with his gabapentin at night. He also has a left knee Baker's cyst which is currently asymptomatic. If it becomes more symptomatic then we could consider aspiration and injection. Encouraged him to continue  to use ice as needed.

## 2017-06-13 ENCOUNTER — Ambulatory Visit: Payer: Medicare Other | Admitting: Sports Medicine

## 2017-06-20 ENCOUNTER — Ambulatory Visit
Admission: RE | Admit: 2017-06-20 | Discharge: 2017-06-20 | Disposition: A | Discharge status: Home / Self Care | Payer: Medicare Other | Source: Ambulatory Visit | Attending: Sports Medicine | Admitting: Sports Medicine

## 2017-06-20 ENCOUNTER — Encounter: Payer: Self-pay | Admitting: Family Medicine

## 2017-06-20 DIAGNOSIS — M5416 Radiculopathy, lumbar region: Secondary | ICD-10-CM | POA: Diagnosis not present

## 2017-06-25 ENCOUNTER — Telehealth: Payer: Self-pay | Admitting: Sports Medicine

## 2017-06-25 NOTE — Telephone Encounter (Signed)
I spoke with Christian Parsons on the phone yesterday after reviewing the MRI of his lumbar spine. He has moderate to severe lower lumbar facet arthropathy at L4-L5 and L5-S1. I do not see any significant spinal stenosis or foraminal stenosis. I discussed these findings with him as well as treatment options including facet injections, returning to physical therapy, or continuing with his home exercise program. At this point in time he would like to continue with his home exercises. He understands that we can reconsider injections or formal physical therapy at a later date. Follow-up with me as needed.

## 2017-06-27 DIAGNOSIS — N183 Chronic kidney disease, stage 3 (moderate): Secondary | ICD-10-CM | POA: Diagnosis not present

## 2017-06-27 DIAGNOSIS — N400 Enlarged prostate without lower urinary tract symptoms: Secondary | ICD-10-CM | POA: Diagnosis not present

## 2017-06-28 ENCOUNTER — Encounter: Payer: Self-pay | Admitting: Family Medicine

## 2017-06-28 ENCOUNTER — Ambulatory Visit (INDEPENDENT_AMBULATORY_CARE_PROVIDER_SITE_OTHER): Payer: Medicare Other | Admitting: Family Medicine

## 2017-06-28 VITALS — BP 124/90 | HR 98 | Temp 97.7°F | Resp 16 | Ht 72.0 in | Wt 242.0 lb

## 2017-06-28 DIAGNOSIS — F3178 Bipolar disorder, in full remission, most recent episode mixed: Secondary | ICD-10-CM

## 2017-06-28 DIAGNOSIS — M47816 Spondylosis without myelopathy or radiculopathy, lumbar region: Secondary | ICD-10-CM

## 2017-06-28 DIAGNOSIS — M899 Disorder of bone, unspecified: Secondary | ICD-10-CM

## 2017-06-28 DIAGNOSIS — M7122 Synovial cyst of popliteal space [Baker], left knee: Secondary | ICD-10-CM

## 2017-06-28 MED ORDER — LAMOTRIGINE 25 MG PO TABS: 25.0000 mg | ORAL_TABLET | Freq: Every day | ORAL | 1 refills | Status: DC

## 2017-06-28 MED ORDER — GABAPENTIN 100 MG PO CAPS: 100.0000 mg | ORAL_CAPSULE | Freq: Four times a day (QID) | ORAL | 1 refills | Status: DC

## 2017-06-28 MED ORDER — LAMOTRIGINE 100 MG PO TABS: 100.0000 mg | ORAL_TABLET | Freq: Every day | ORAL | 1 refills | Status: DC

## 2017-06-28 NOTE — Progress Notes (Signed)
Subjective:    Patient ID: Christian Parsons, male    DOB: [birth year], 71 y.o.   MRN: [id number]  06/28/2017  Results (discuss MRI results)    HPI This 71 y.o. male presents for evaluation of chronic lower back pain with LEFT sciatica.  S/p follow-up with Dr. Micheline Chapman who performed MRI lumbar spine.  Patient presenting to review MRI results with PCP and to discuss treatment options.  Pt not interested in injections or lumbar surgery; has researched medications extensively.  Quality of life has been negatively impacted in the past year due to daily LEFT sciatica pain.  Dr. Micheline Chapman recommended injections versus additional physical therapy.  Patient has been participating in daily home exercise program.  Patient has also been hiking regularly with persistent symptoms.  Lamotrigine and Gabapentin have been indicated together for nerve pain.  Lies Gabapentin. Takes one gabapentin qhs.  Cyst behind LEFT  knee has improved greatly. S/p lower extremity doppler that revealed a Baker's cyst.   BP Readings from Last 3 Encounters:  06/28/17 124/90  06/10/17 120/70  06/03/17 136/83   Wt Readings from Last 3 Encounters:  06/28/17 242 lb (109.8 kg)  06/10/17 235 lb (106.6 kg)  06/03/17 237 lb (107.5 kg)   Immunization History  Administered Date(s) Administered  . Influenza Split 07/24/2012  . Influenza,inj,Quad PF,6+ Mos 06/16/2013, 05/25/2014, 06/18/2015, 07/30/2016, 06/03/2017  . Pneumococcal Conjugate-13 07/30/2016  . Pneumococcal Polysaccharide-23 11/07/2010  . Td 01/15/2004  . Tdap 06/16/2013    Review of Systems  Constitutional: Negative for activity change, appetite change, chills, diaphoresis, fatigue and fever.  Respiratory: Negative for cough and shortness of breath.   Cardiovascular: Negative for chest pain, palpitations and leg swelling.  Gastrointestinal: Negative for abdominal pain, diarrhea, nausea and vomiting.  Endocrine: Negative for cold intolerance, heat intolerance,  polydipsia, polyphagia and polyuria.  Musculoskeletal: Positive for back pain, gait problem and myalgias.  Skin: Negative for color change, rash and wound.  Neurological: Negative for dizziness, tremors, seizures, syncope, facial asymmetry, speech difficulty, weakness, light-headedness, numbness and headaches.  Psychiatric/Behavioral: Negative for dysphoric mood and sleep disturbance. The patient is not nervous/anxious.     Past Medical History:  Diagnosis Date  . Anxiety   . Bipolar disorder ([facility])    H/O  . BPH with urinary obstruction    s/p TURP  . Chronic kidney disease   . Chronic renal insufficiency, stage 2 (mild)    secondary to lithium toxicity; baseline creatinine 1.3-1.6  . Coronary artery disease   . Depression   . Hypertension    Past Surgical History:  Procedure Laterality Date  . HERNIA REPAIR    . PROSTATE SURGERY    . TRANSURETHRAL RESECTION OF PROSTATE     No Known Allergies Current Outpatient Prescriptions on File Prior to Visit  Medication Sig Dispense Refill  . BABY ASPIRIN PO Take by mouth daily.     . rizatriptan (MAXALT) 10 MG tablet TAKE 1 TABLET (10 MG TOTAL) BY MOUTH AS NEEDED. MAY REPEAT IN 2 HRS IF NEEDED. 10 tablet 3  . traMADol (ULTRAM) 50 MG tablet Take 1 tablet (50 mg total) by mouth every 8 (eight) hours as needed. 30 tablet 0  . aspirin EC 81 MG tablet Take 81 mg by mouth.    . sildenafil (VIAGRA) 100 MG tablet TAKE 0.5-1 TABLETS (50-100 MG TOTAL) BY MOUTH DAILY AS NEEDED FOR ERECTILE DYSFUNCTION.  3   No current facility-administered medications on file prior to visit.    Social History  Social History  . Marital status: Married    Spouse name: N/A  . Number of children: 1  . Years of education: 16+   Occupational History  . retired     Music therapist   Social History Main Topics  . Smoking status: Never Smoker  . Smokeless tobacco: Never Used  . Alcohol use 1.2 oz/week    2 Standard drinks or equivalent per week  . Drug  use: No  . Sexual activity: Yes    Partners: Female   Other Topics Concern  . Not on file   Social History Narrative   Exercise: walks 4-5 days per week also hikes 4 days per week 1-7 hours per day.     Advanced Directives; YES; full code but no prolonged measures.   Family History  Problem Relation Age of Onset  . Cancer Mother        breast cancer  . Dementia Father   . Arthritis Sister        Objective:    BP 124/90   Pulse 98   Temp 97.7 F (36.5 C)   Resp 16   Ht 6' (1.829 m)   Wt 242 lb (109.8 kg)   SpO2 96%   BMI 32.82 kg/m  Physical Exam  Constitutional: He is oriented to person, place, and time. He appears well-developed and well-nourished. No distress.  HENT:  Head: Normocephalic and atraumatic.  Eyes: Pupils are equal, round, and reactive to light. Conjunctivae and EOM are normal.  Neck: Normal range of motion. Carotid bruit is not present.  Cardiovascular: Normal rate, regular rhythm, normal heart sounds and intact distal pulses.  Exam reveals no gallop and no friction rub.   No murmur heard. Pulmonary/Chest: Effort normal and breath sounds normal. He has no wheezes. He has no rales.  Musculoskeletal:       Left knee: He exhibits normal range of motion, no swelling and no effusion.  Posterior knee with palpable cystic lesion 2cm diameter which is much smaller than previous exam.  Neurological: He is alert and oriented to person, place, and time. No cranial nerve deficit.  Skin: Skin is warm and dry. No rash noted. He is not diaphoretic.  Psychiatric: He has a normal mood and affect. His behavior is normal.  Nursing note and vitals reviewed.  No results found. Depression screen [facility] 2/9 06/28/2017 06/03/2017 03/28/2017 11/27/2016 08/21/2016  Decreased Interest 0 0 0 0 0  Down, Depressed, Hopeless 0 0 0 0 0  PHQ - 2 Score 0 0 0 0 0   Fall Risk  06/28/2017 06/10/2017 06/03/2017 04/24/2017 03/28/2017  Falls in the past year? No No No No No  Risk for fall due to  : - Other (Comment) - - -   Dg Lumbar Spine Complete  Result Date: 06/03/2017 CLINICAL DATA:  Left-sided sciatic pain with walking EXAM: LUMBAR SPINE - COMPLETE 4+ VIEW COMPARISON:  CT abdomen pelvis - 05/12/2014 FINDINGS: There are 5 non rib-bearing lumbar type vertebral bodies. There is a very mild scoliotic curvature of the thoracolumbar spine with dominant caudal component convex to the left measuring less than 5 degrees. No anterolisthesis or retrolisthesis Mild (< 25%) compression deformity involving the inferior endplate of Z61 is unchanged. Remaining lumbar vertebral body heights appear preserved. Mild to moderate multilevel lumbar spine DDD, worse at L3-L4 with disc space height loss, endplate irregularity and sclerosis. The bilateral facets appear normally aligned. Limited visualization of the bilateral SI joints is normal. Calcified atherosclerotic plaque within  the abdominal aorta. Regional bowel gas pattern is normal. IMPRESSION: 1. Mild to moderate multilevel lumbar spine DDD, worse at L3-L4, similar to abdominal CT performed 05/12/2014. 2.  Aortic Atherosclerosis (ICD10-I70.0). Electronically Signed   By: Sandi Mariscal M.D.   On: 06/03/2017 09:28   Mr Lumbar Spine Wo Contrast  Result Date: 06/20/2017 CLINICAL DATA:  Left lumbar radiculopathy. EXAM: MRI LUMBAR SPINE WITHOUT CONTRAST TECHNIQUE: Multiplanar, multisequence MR imaging of the lumbar spine was performed. No intravenous contrast was administered. COMPARISON:  Lumbar spine x-rays dated June 03, 2017. CT abdomen pelvis dated May 12, 2014. FINDINGS: Segmentation:  Standard. Alignment: Trace anterolisthesis at L4-L5. Alignment is otherwise maintained. Vertebrae: No fracture or evidence of discitis. There is a 10 mm T2 hyperintense, T1 hypointense lesion in the left posterior aspect of the L1 vertebral body. There is an 8 mm, similar appearing T2 hyperintense, T1 hypointense round lesion in the posterior inferior aspect of the L4  vertebral body. Prominent Schmorl's nodes along the anterior superior endplate of L4. Conus medullaris: Extends to the L1-L3 level and appears normal. Paraspinal and other soft tissues: Ectatic left common iliac artery measuring up to 2.3 cm, unchanged. Otherwise negative. Disc levels: T11-T12:  Negative. T12-L1:  Negative. L1-L2:  Negative. L2-L3:  Negative. L3-L4:  Negative. L4-L5: Moderate to severe bilateral facet arthropathy. No significant spinal canal or neuroforaminal stenosis. L5-S1: Annular fissure and right foraminal disc osteophyte complex resulting in mild right neuroforaminal stenosis. Moderate left-greater-than-right facet arthropathy. No significant central spinal canal or left neuroforaminal stenosis. IMPRESSION: 1. Moderate to severe lower lumbar facet arthropathy at L4-L5 and L5-S1. 2. No high-grade spinal canal or neuroforaminal stenosis at any level. Mild right neuroforaminal stenosis at L5-S1. 3. Two nonspecific subcentimeter lesions in the L1 and L4 vertebral bodies. These are not clearly hemangiomas, but are favored benign in the absence of known malignancy. Consider follow-up MRI in 3-6 months to evaluate for interval change. Electronically Signed   By: Titus Dubin M.D.   On: 06/20/2017 16:24        Assessment & Plan:   1. Lumbar facet arthropathy   2. Bipolar disorder, in full remission, most recent episode mixed ([facility])   3. Baker's cyst of knee, left   4. Lesion of vertebra     -reviewed results of MRI lumbar spine in detail during visit; agreeable with decrease in Lamictal to 125mg  and increasing Gabapentin to 100-200mg  four times daily as needed for pain.  Refer to orthopedics to discuss injections. -stable bipolar disorder at this time; monitor closely with decrease in Lamictal dose. -LEFT knee Baker's cyst improving.  S/p doppler -new lesion of vertebra; will warrant repeat MRI in six months.   Orders Placed This Encounter  Procedures  . Ambulatory referral to  Orthopedic Surgery    Referral Priority:   Routine    Referral Type:   Surgical    Referral Reason:   Specialty Services Required    Requested Specialty:   Orthopedic Surgery    Number of Visits Requested:   1   Meds ordered this encounter  Medications  . lamoTRIgine (LAMICTAL) 100 MG tablet    Sig: Take 1 tablet (100 mg total) by mouth daily.    Dispense:  90 tablet    Refill:  1  . lamoTRIgine (LAMICTAL) 25 MG tablet    Sig: Take 1 tablet (25 mg total) by mouth daily.    Dispense:  90 tablet    Refill:  1  . gabapentin (NEURONTIN) 100 MG  capsule    Sig: Take 1-3 capsules (100-300 mg total) by mouth 4 (four) times daily.    Dispense:  90 capsule    Refill:  1    Return in about 6 weeks (around 08/09/2017) for recheck.   Kristine Tiley Elayne Guerin, M.D. Primary Care at [facility] [address] [place], [place]  [address] [phone] phone [phone] fax

## 2017-06-28 NOTE — Patient Instructions (Signed)
     IF you received an x-ray today, you will receive an invoice from [facility]. Please contact [facility] at [phone] with questions or concerns regarding your invoice.   IF you received labwork today, you will receive an invoice from [facility]. Please contact [facility] at [phone] with questions or concerns regarding your invoice.   Our billing staff will not be able to assist you with questions regarding bills from these companies.  You will be contacted with the lab results as soon as they are available. The fastest way to get your results is to activate your My Chart account. Instructions are located on the last page of this paperwork. If you have not heard from us regarding the results in 2 weeks, please contact this office.     

## 2017-07-04 ENCOUNTER — Telehealth: Payer: Self-pay | Admitting: Family Medicine

## 2017-07-04 ENCOUNTER — Encounter: Payer: Self-pay | Admitting: Family Medicine

## 2017-07-04 NOTE — Telephone Encounter (Signed)
Spoke with pt concerning ortho referral. Raliegh Ip did contact him but he was not sure this is where Dr. Tamala Julian wanted him to go. I let him know she specified this practice and he has an appt with them on 07/31/17. He said he sent a MyChart message to Dr. Tamala Julian about this but it can be disregarded. Thanks!

## 2017-07-05 ENCOUNTER — Encounter: Payer: Self-pay | Admitting: Family Medicine

## 2017-07-25 ENCOUNTER — Encounter: Payer: Self-pay | Admitting: Family Medicine

## 2017-07-29 ENCOUNTER — Other Ambulatory Visit: Payer: Self-pay | Admitting: Family Medicine

## 2017-07-29 DIAGNOSIS — F3178 Bipolar disorder, in full remission, most recent episode mixed: Secondary | ICD-10-CM

## 2017-07-30 ENCOUNTER — Other Ambulatory Visit: Payer: Self-pay | Admitting: Family Medicine

## 2017-07-31 NOTE — Telephone Encounter (Signed)
Please review for refill.  

## 2017-08-01 ENCOUNTER — Ambulatory Visit: Payer: Medicare Other

## 2017-08-05 ENCOUNTER — Ambulatory Visit (INDEPENDENT_AMBULATORY_CARE_PROVIDER_SITE_OTHER): Payer: Medicare Other

## 2017-08-05 VITALS — BP 130/80 | HR 73 | Temp 98.1°F | Ht 72.0 in | Wt 245.1 lb

## 2017-08-05 DIAGNOSIS — Z0000 Encounter for general adult medical examination without abnormal findings: Secondary | ICD-10-CM | POA: Diagnosis not present

## 2017-08-05 DIAGNOSIS — Z23 Encounter for immunization: Secondary | ICD-10-CM | POA: Diagnosis not present

## 2017-08-05 NOTE — Progress Notes (Signed)
Subjective:   Christian Parsons is a 71 y.o. male who presents for Medicare Annual/Subsequent preventive examination.  Review of Systems:  N/A Cardiac Risk Factors include: advanced age (>63men, >40 women);male gender;obesity (BMI >30kg/m2)     Objective:    Vitals: BP 130/80   Pulse 73   Temp 98.1 F (36.7 C) (Oral)   Ht 6' (1.829 m)   Wt 245 lb 2 oz (111.2 kg)   SpO2 95%   BMI 33.24 kg/m   Body mass index is 33.24 kg/m.  Tobacco Social History   Tobacco Use  Smoking Status Never Smoker  Smokeless Tobacco Never Used     Counseling given: Not Answered   Past Medical History:  Diagnosis Date  . Anxiety   . Bipolar disorder ([facility])    H/O  . BPH with urinary obstruction    s/p TURP  . Chronic kidney disease   . Chronic renal insufficiency, stage 2 (mild)    secondary to lithium toxicity; baseline creatinine 1.3-1.6  . Coronary artery disease   . Depression   . Hypertension    Past Surgical History:  Procedure Laterality Date  . HERNIA REPAIR    . PROSTATE SURGERY    . TRANSURETHRAL RESECTION OF PROSTATE     Family History  Problem Relation Age of Onset  . Cancer Mother        breast cancer  . Dementia Father   . Arthritis Sister    Social History   Substance and Sexual Activity  Sexual Activity Yes  . Partners: Female    Outpatient Encounter Medications as of 08/05/2017  Medication Sig  . aspirin EC 81 MG tablet Take 81 mg by mouth.  Marland Kitchen BABY ASPIRIN PO Take by mouth daily.   Marland Kitchen gabapentin (NEURONTIN) 100 MG capsule TAKE 1-3 CAPSULES (100-300 MG TOTAL) BY MOUTH 4 (FOUR) TIMES DAILY.  Marland Kitchen lamoTRIgine (LAMICTAL) 150 MG tablet Take 150 mg by mouth.  . lamoTRIgine (LAMICTAL) 25 MG tablet Take 1 tablet (25 mg total) by mouth daily.  . rizatriptan (MAXALT) 10 MG tablet TAKE 1 TABLET (10 MG TOTAL) BY MOUTH AS NEEDED. MAY REPEAT IN 2 HRS IF NEEDED.  . [DISCONTINUED] lamoTRIgine (LAMICTAL) 100 MG tablet Take 1 tablet (100 mg total) by mouth daily.  .  [DISCONTINUED] sildenafil (VIAGRA) 100 MG tablet TAKE 0.5-1 TABLETS (50-100 MG TOTAL) BY MOUTH DAILY AS NEEDED FOR ERECTILE DYSFUNCTION.  . [DISCONTINUED] traMADol (ULTRAM) 50 MG tablet Take 1 tablet (50 mg total) by mouth every 8 (eight) hours as needed.   No facility-administered encounter medications on file as of 08/05/2017.     Activities of Daily Living In your present state of health, do you have any difficulty performing the following activities: 08/05/2017 11/27/2016  Hearing? Y N  Comment Patient states that background noise is a problem -  Vision? Y Y  Comment Patient states that he needs to schedule an exam because he is having vision issues.  pt states just a little  Difficulty concentrating or making decisions? Y Y  Comment Patient has issues with remembering things pt states sometimes  Walking or climbing stairs? N N  Dressing or bathing? N N  Doing errands, shopping? N N  Preparing Food and eating ? N -  Using the Toilet? N -  In the past six months, have you accidently leaked urine? Y -  Comment patient has urine leakage sometimes.  -  Do you have problems with loss of bowel control? N -  Managing your Medications? N -  Managing your Finances? N -  Housekeeping or managing your Housekeeping? N -  Some recent data might be hidden    Patient Care Team: Christian Honour, MD as PCP - General (Family Medicine) Christian Parsons, [place] as Referring Physician (Optometry)   Assessment:     Exercise Activities and Dietary recommendations Current Exercise Habits: The patient does not participate in regular exercise at present, Exercise limited by: None identified  Goals    . Exercise 3x per week (30 min per time)     Patient states that he wants to get back to walking on a daily basis.       Fall Risk Fall Risk  08/05/2017 06/28/2017 06/10/2017 06/03/2017 04/24/2017  Falls in the past year? No No No No No  Risk for fall due to : - - Other (Comment) - -   Depression  Screen PHQ 2/9 Scores 08/05/2017 06/28/2017 06/10/2017 06/03/2017  PHQ - 2 Score 1 0 - 0  Exception Documentation - - Other- indicate reason in comment box -    Cognitive Function     6CIT Screen 08/05/2017  What Year? 0 points  What month? 0 points  What time? 0 points  Count back from 20 0 points  Months in reverse 0 points  Repeat phrase 0 points  Total Score 0    Immunization History  Administered Date(s) Administered  . Influenza Split 07/24/2012  . Influenza,inj,Quad PF,6+ Mos 06/16/2013, 05/25/2014, 06/18/2015, 07/30/2016, 06/03/2017  . Pneumococcal Conjugate-13 07/30/2016  . Pneumococcal Polysaccharide-23 11/07/2010, 08/05/2017  . Td 01/15/2004  . Tdap 06/16/2013   Screening Tests Health Maintenance  Topic Date Due  . TETANUS/TDAP  06/17/2023  . COLONOSCOPY  02/15/2024  . INFLUENZA VACCINE  Completed  . Hepatitis C Screening  Completed  . PNA vac Low Risk Adult  Completed      Plan:   I have personally reviewed and noted the following in the patient's chart:   . Medical and social history . Use of alcohol, tobacco or illicit drugs  . Current medications and supplements . Functional ability and status . Nutritional status . Physical activity . Advanced directives . List of other physicians . Hospitalizations, surgeries, and ER visits in previous 12 months . Vitals . Screenings to include cognitive, depression, and falls . Referrals and appointments  In addition, I have reviewed and discussed with patient certain preventive protocols, quality metrics, and best practice recommendations. A written personalized care plan for preventive services as well as general preventive health recommendations were provided to patient.    1. Encounter for Medicare annual wellness exam  2. Need for 23-polyvalent pneumococcal polysaccharide vaccine - Pneumococcal 23   Christian Parsons, Christian Parsons, [name]  30/05/2329

## 2017-08-05 NOTE — Patient Instructions (Addendum)
Christian Parsons , Thank you for taking time to come for your Medicare Wellness Visit. I appreciate your ongoing commitment to your health goals. Please review the following plan we discussed and let me know if I can assist you in the future.   Screening recommendations/referrals: Colonoscopy: up to date, next due 02/15/2024 Recommended yearly ophthalmology/optometry visit for glaucoma screening and checkup Recommended yearly dental visit for hygiene and checkup  Vaccinations: Influenza vaccine: up to date Pneumococcal vaccine: administered today  Tdap vaccine: up to date, next due 06/17/2023 Shingles vaccine: Check with your pharmacy about receiving this vaccine     Advanced directives: Please bring a copy of your POA (Power of [place]) and/or Living Will to your next appointment.   Conditions/risks identified: Try to get back to walking on a daily basis.   Next appointment: 08/11/17 @ 9:20 am with Dr. Tamala Julian   Preventive Care 65 Years and Older, Male Preventive care refers to lifestyle choices and visits with your health care provider that can promote health and wellness. What does preventive care include?  A yearly physical exam. This is also called an annual well check.  Dental exams once or twice a year.  Routine eye exams. Ask your health care provider how often you should have your eyes checked.  Personal lifestyle choices, including:  Daily care of your teeth and gums.  Regular physical activity.  Eating a healthy diet.  Avoiding tobacco and drug use.  Limiting alcohol use.  Practicing safe sex.  Taking low doses of aspirin every day.  Taking vitamin and mineral supplements as recommended by your health care provider. What happens during an annual well check? The services and screenings done by your health care provider during your annual well check will depend on your age, overall health, lifestyle risk factors, and family history of disease. Counseling  Your health  care provider may ask you questions about your:  Alcohol use.  Tobacco use.  Drug use.  Emotional well-being.  Home and relationship well-being.  Sexual activity.  Eating habits.  History of falls.  Memory and ability to understand (cognition).  Work and work Statistician. Screening  You may have the following tests or measurements:  Height, weight, and BMI.  Blood pressure.  Lipid and cholesterol levels. These may be checked every 5 years, or more frequently if you are over 58 years old.  Skin check.  Lung cancer screening. You may have this screening every year starting at age 51 if you have a 30-pack-year history of smoking and currently smoke or have quit within the past 15 years.  Fecal occult blood test (FOBT) of the stool. You may have this test every year starting at age 69.  Flexible sigmoidoscopy or colonoscopy. You may have a sigmoidoscopy every 5 years or a colonoscopy every 10 years starting at age 27.  Prostate cancer screening. Recommendations will vary depending on your family history and other risks.  Hepatitis C blood test.  Hepatitis B blood test.  Sexually transmitted disease (STD) testing.  Diabetes screening. This is done by checking your blood sugar (glucose) after you have not eaten for a while (fasting). You may have this done every 1-3 years.  Abdominal aortic aneurysm (AAA) screening. You may need this if you are a current or former smoker.  Osteoporosis. You may be screened starting at age 108 if you are at high risk. Talk with your health care provider about your test results, treatment options, and if necessary, the need for more tests.  Vaccines  Your health care provider may recommend certain vaccines, such as:  Influenza vaccine. This is recommended every year.  Tetanus, diphtheria, and acellular pertussis (Tdap, Td) vaccine. You may need a Td booster every 10 years.  Zoster vaccine. You may need this after age  35.  Pneumococcal 13-valent conjugate (PCV13) vaccine. One dose is recommended after age 45.  Pneumococcal polysaccharide (PPSV23) vaccine. One dose is recommended after age 34. Talk to your health care provider about which screenings and vaccines you need and how often you need them. This information is not intended to replace advice given to you by your health care provider. Make sure you discuss any questions you have with your health care provider. Document Released: 09/29/2015 Document Revised: 05/22/2016 Document Reviewed: 07/04/2015 Elsevier Interactive Patient Education  2017 [place] Prevention in the Home Falls can cause injuries. They can happen to people of all ages. There are many things you can do to make your home safe and to help prevent falls. What can I do on the outside of my home?  Regularly fix the edges of walkways and driveways and fix any cracks.  Remove anything that might make you trip as you walk through a door, such as a raised step or threshold.  Trim any bushes or trees on the path to your home.  Use bright outdoor lighting.  Clear any walking paths of anything that might make someone trip, such as rocks or tools.  Regularly check to see if handrails are loose or broken. Make sure that both sides of any steps have handrails.  Any raised decks and porches should have guardrails on the edges.  Have any leaves, snow, or ice cleared regularly.  Use sand or salt on walking paths during winter.  Clean up any spills in your garage right away. This includes oil or grease spills. What can I do in the bathroom?  Use night lights.  Install grab bars by the toilet and in the tub and shower. Do not use towel bars as grab bars.  Use non-skid mats or decals in the tub or shower.  If you need to sit down in the shower, use a plastic, non-slip stool.  Keep the floor dry. Clean up any water that spills on the floor as soon as it happens.  Remove  soap buildup in the tub or shower regularly.  Attach bath mats securely with double-sided non-slip rug tape.  Do not have throw rugs and other things on the floor that can make you trip. What can I do in the bedroom?  Use night lights.  Make sure that you have a light by your bed that is easy to reach.  Do not use any sheets or blankets that are too big for your bed. They should not hang down onto the floor.  Have a firm chair that has side arms. You can use this for support while you get dressed.  Do not have throw rugs and other things on the floor that can make you trip. What can I do in the kitchen?  Clean up any spills right away.  Avoid walking on wet floors.  Keep items that you use a lot in easy-to-reach places.  If you need to reach something above you, use a strong step stool that has a grab bar.  Keep electrical cords out of the way.  Do not use floor polish or wax that makes floors slippery. If you must use wax, use non-skid floor wax.  Do not have throw rugs and other things on the floor that can make you trip. What can I do with my stairs?  Do not leave any items on the stairs.  Make sure that there are handrails on both sides of the stairs and use them. Fix handrails that are broken or loose. Make sure that handrails are as long as the stairways.  Check any carpeting to make sure that it is firmly attached to the stairs. Fix any carpet that is loose or worn.  Avoid having throw rugs at the top or bottom of the stairs. If you do have throw rugs, attach them to the floor with carpet tape.  Make sure that you have a light switch at the top of the stairs and the bottom of the stairs. If you do not have them, ask someone to add them for you. What else can I do to help prevent falls?  Wear shoes that:  Do not have high heels.  Have rubber bottoms.  Are comfortable and fit you well.  Are closed at the toe. Do not wear sandals.  If you use a  stepladder:  Make sure that it is fully opened. Do not climb a closed stepladder.  Make sure that both sides of the stepladder are locked into place.  Ask someone to hold it for you, if possible.  Clearly mark and make sure that you can see:  Any grab bars or handrails.  First and last steps.  Where the edge of each step is.  Use tools that help you move around (mobility aids) if they are needed. These include:  Canes.  Walkers.  Scooters.  Crutches.  Turn on the lights when you go into a dark area. Replace any light bulbs as soon as they burn out.  Set up your furniture so you have a clear path. Avoid moving your furniture around.  If any of your floors are uneven, fix them.  If there are any pets around you, be aware of where they are.  Review your medicines with your doctor. Some medicines can make you feel dizzy. This can increase your chance of falling. Ask your doctor what other things that you can do to help prevent falls. This information is not intended to replace advice given to you by your health care provider. Make sure you discuss any questions you have with your health care provider. Document Released: 06/29/2009 Document Revised: 02/08/2016 Document Reviewed: 10/07/2014 Elsevier Interactive Patient Education  2017 [employer].

## 2017-08-11 ENCOUNTER — Ambulatory Visit (INDEPENDENT_AMBULATORY_CARE_PROVIDER_SITE_OTHER): Payer: Medicare Other | Admitting: Family Medicine

## 2017-08-11 ENCOUNTER — Encounter: Payer: Self-pay | Admitting: Family Medicine

## 2017-08-11 ENCOUNTER — Other Ambulatory Visit: Payer: Self-pay

## 2017-08-11 VITALS — BP 136/80 | HR 72 | Temp 98.0°F | Resp 16 | Ht 71.65 in | Wt 244.0 lb

## 2017-08-11 DIAGNOSIS — Z6831 Body mass index (BMI) 31.0-31.9, adult: Secondary | ICD-10-CM | POA: Diagnosis not present

## 2017-08-11 DIAGNOSIS — F3178 Bipolar disorder, in full remission, most recent episode mixed: Secondary | ICD-10-CM | POA: Diagnosis not present

## 2017-08-11 DIAGNOSIS — G43109 Migraine with aura, not intractable, without status migrainosus: Secondary | ICD-10-CM

## 2017-08-11 DIAGNOSIS — Z23 Encounter for immunization: Secondary | ICD-10-CM | POA: Diagnosis not present

## 2017-08-11 DIAGNOSIS — N138 Other obstructive and reflux uropathy: Secondary | ICD-10-CM | POA: Diagnosis not present

## 2017-08-11 DIAGNOSIS — N182 Chronic kidney disease, stage 2 (mild): Secondary | ICD-10-CM | POA: Diagnosis not present

## 2017-08-11 DIAGNOSIS — G250 Essential tremor: Secondary | ICD-10-CM | POA: Diagnosis not present

## 2017-08-11 DIAGNOSIS — I700 Atherosclerosis of aorta: Secondary | ICD-10-CM

## 2017-08-11 DIAGNOSIS — N5203 Combined arterial insufficiency and corporo-venous occlusive erectile dysfunction: Secondary | ICD-10-CM | POA: Diagnosis not present

## 2017-08-11 DIAGNOSIS — N401 Enlarged prostate with lower urinary tract symptoms: Secondary | ICD-10-CM | POA: Diagnosis not present

## 2017-08-11 DIAGNOSIS — T56891A Toxic effect of other metals, accidental (unintentional), initial encounter: Secondary | ICD-10-CM | POA: Diagnosis not present

## 2017-08-11 DIAGNOSIS — I4510 Unspecified right bundle-branch block: Secondary | ICD-10-CM | POA: Diagnosis not present

## 2017-08-11 DIAGNOSIS — N141 Nephropathy induced by other drugs, medicaments and biological substances: Secondary | ICD-10-CM

## 2017-08-11 LAB — POCT URINALYSIS DIP (MANUAL ENTRY)
Bilirubin, UA: NEGATIVE
Glucose, UA: NEGATIVE mg/dL
Ketones, POC UA: NEGATIVE mg/dL
LEUKOCYTES UA: NEGATIVE
NITRITE UA: NEGATIVE
PH UA: 7 (ref 5.0–8.0)
PROTEIN UA: NEGATIVE mg/dL
RBC UA: NEGATIVE
Spec Grav, UA: 1.01 (ref 1.010–1.025)
UROBILINOGEN UA: 0.2 U/dL

## 2017-08-11 LAB — COMPREHENSIVE METABOLIC PANEL
ALBUMIN: 4.4 g/dL (ref 3.6–4.8)
ALT: 30 IU/L (ref 0–44)
AST: 28 IU/L (ref 0–40)
Albumin/Globulin Ratio: 1.6 (ref 1.2–2.2)
Alkaline Phosphatase: 84 IU/L (ref 39–117)
BUN / CREAT RATIO: 12 (ref 10–24)
BUN: 19 mg/dL (ref 8–27)
Bilirubin Total: 0.6 mg/dL (ref 0.0–1.2)
CO2: 24 mmol/L (ref 20–29)
CREATININE: 1.56 mg/dL — AB (ref 0.76–1.27)
Calcium: 9.6 mg/dL (ref 8.6–10.2)
Chloride: 105 mmol/L (ref 96–106)
GFR, EST AFRICAN AMERICAN: 52 mL/min/{1.73_m2} — AB (ref >59)
GFR, EST NON AFRICAN AMERICAN: 45 mL/min/{1.73_m2} — AB (ref >59)
GLOBULIN, TOTAL: 2.8 g/dL (ref 1.5–4.5)
GLUCOSE: 92 mg/dL (ref 65–99)
Potassium: 4.5 mmol/L (ref 3.5–5.2)
SODIUM: 142 mmol/L (ref 134–144)
TOTAL PROTEIN: 7.2 g/dL (ref 6.0–8.5)

## 2017-08-11 LAB — LIPID PANEL
CHOL/HDL RATIO: 4.9 ratio (ref 0.0–5.0)
Cholesterol, Total: 143 mg/dL (ref 100–199)
HDL: 29 mg/dL — ABNORMAL LOW (ref >39)
LDL Calculated: 83 mg/dL (ref 0–99)
Triglycerides: 153 mg/dL — ABNORMAL HIGH (ref 0–149)
VLDL Cholesterol Cal: 31 mg/dL (ref 5–40)

## 2017-08-11 MED ORDER — ZOSTER VAC RECOMB ADJUVANTED 50 MCG/0.5ML IM SUSR: 0.5000 mL | Freq: Once | INTRAMUSCULAR | 1 refills | Status: AC

## 2017-08-11 NOTE — Patient Instructions (Addendum)
   IF you received an x-ray today, you will receive an invoice from [facility]. Please contact [facility] at [phone] with questions or concerns regarding your invoice.   IF you received labwork today, you will receive an invoice from [facility]. Please contact [facility] at [phone] with questions or concerns regarding your invoice.   Our billing staff will not be able to assist you with questions regarding bills from these companies.  You will be contacted with the lab results as soon as they are available. The fastest way to get your results is to activate your My Chart account. Instructions are located on the last page of this paperwork. If you have not heard from us regarding the results in 2 weeks, please contact this office.      Preventive Care 69 Years and Older, Male Preventive care refers to lifestyle choices and visits with your health care provider that can promote health and wellness. What does preventive care include?  A yearly physical exam. This is also called an annual well check.  Dental exams once or twice a year.  Routine eye exams. Ask your health care provider how often you should have your eyes checked.  Personal lifestyle choices, including: ? Daily care of your teeth and gums. ? Regular physical activity. ? Eating a healthy diet. ? Avoiding tobacco and drug use. ? Limiting alcohol use. ? Practicing safe sex. ? Taking low doses of aspirin every day. ? Taking vitamin and mineral supplements as recommended by your health care provider. What happens during an annual well check? The services and screenings done by your health care provider during your annual well check will depend on your age, overall health, lifestyle risk factors, and family history of disease. Counseling Your health care provider may ask you questions about your:  Alcohol use.  Tobacco use.  Drug use.  Emotional well-being.  Home and relationship  well-being.  Sexual activity.  Eating habits.  History of falls.  Memory and ability to understand (cognition).  Work and work environment.  Screening You may have the following tests or measurements:  Height, weight, and BMI.  Blood pressure.  Lipid and cholesterol levels. These may be checked every 5 years, or more frequently if you are over 50 years old.  Skin check.  Lung cancer screening. You may have this screening every year starting at age 55 if you have a 30-pack-year history of smoking and currently smoke or have quit within the past 15 years.  Fecal occult blood test (FOBT) of the stool. You may have this test every year starting at age 50.  Flexible sigmoidoscopy or colonoscopy. You may have a sigmoidoscopy every 5 years or a colonoscopy every 10 years starting at age 50.  Prostate cancer screening. Recommendations will vary depending on your family history and other risks.  Hepatitis C blood test.  Hepatitis B blood test.  Sexually transmitted disease (STD) testing.  Diabetes screening. This is done by checking your blood sugar (glucose) after you have not eaten for a while (fasting). You may have this done every 1-3 years.  Abdominal aortic aneurysm (AAA) screening. You may need this if you are a current or former smoker.  Osteoporosis. You may be screened starting at age 70 if you are at high risk.  Talk with your health care provider about your test results, treatment options, and if necessary, the need for more tests. Vaccines Your health care provider may recommend certain vaccines, such as:  Influenza vaccine. This   is recommended every year.  Tetanus, diphtheria, and acellular pertussis (Tdap, Td) vaccine. You may need a Td booster every 10 years.  Varicella vaccine. You may need this if you have not been vaccinated.  Zoster vaccine. You may need this after age 69.  Measles, mumps, and rubella (MMR) vaccine. You may need at least one dose of  MMR if you were born in 1957 or later. You may also need a second dose.  Pneumococcal 13-valent conjugate (PCV13) vaccine. One dose is recommended after age 69.  Pneumococcal polysaccharide (PPSV23) vaccine. One dose is recommended after age 69.  Meningococcal vaccine. You may need this if you have certain conditions.  Hepatitis A vaccine. You may need this if you have certain conditions or if you travel or work in places where you may be exposed to hepatitis A.  Hepatitis B vaccine. You may need this if you have certain conditions or if you travel or work in places where you may be exposed to hepatitis B.  Haemophilus influenzae type b (Hib) vaccine. You may need this if you have certain risk factors.  Talk to your health care provider about which screenings and vaccines you need and how often you need them. This information is not intended to replace advice given to you by your health care provider. Make sure you discuss any questions you have with your health care provider. Document Released: 09/29/2015 Document Revised: 05/22/2016 Document Reviewed: 07/04/2015 Elsevier Interactive Patient Education  2017 [employer].

## 2017-08-11 NOTE — Progress Notes (Signed)
Subjective:    Patient ID: Christian Parsons, male    DOB: [birth year], 71 y.o.   MRN: [id number]  08/11/2017  Annual Exam    HPI This 71 y.o. male presents for a Complete Physical Examination and follow-up of chronic medical conditions.  Last physical: AWV 07/20/2016, 08/05/17 with [facility]. Colonoscopy:  2015  [place] PSA:  2015, 2018 3.02 in 04/2017 Eye exam:  [facility]; 2017 Dental exam:  Recent; [place]   Visual Acuity Screening   Right eye Left eye Both eyes  Without correction:     With correction: 20/20 20/25 20/20     BP Readings from Last 3 Encounters:  08/11/17 136/80  08/05/17 130/80  06/28/17 124/90   Wt Readings from Last 3 Encounters:  08/11/17 244 lb (110.7 kg)  08/05/17 245 lb 2 oz (111.2 kg)  06/28/17 242 lb (109.8 kg)   Immunization History  Administered Date(s) Administered  . Influenza Split 07/24/2012  . Influenza,inj,Quad PF,6+ Mos 06/16/2013, 05/25/2014, 06/18/2015, 07/30/2016, 06/03/2017  . Pneumococcal Conjugate-13 07/30/2016  . Pneumococcal Polysaccharide-23 11/07/2010, 08/05/2017  . Td 01/15/2004  . Tdap 06/16/2013   L sciatica; accupuncturist has cut back walking since last visit; pan much improved with accupuncturist.  Mariah Milling; was seeing twice per week for two hours per session; now once per week two hour session.  Mandated magnesium floats; 1500 lbs of magnesium emersed in water.  Lies in magnesium.  Floats on top.Skin absorbs magnesium.  Very pleasant; agreed to four of them.  New age on [place].  Taking Gabapentin 700mg  2/2/3.   Sleeping better; no longer napping during the day.  Bipolar disorder: Patient reports good compliance with medication, good tolerance to medication, and good symptom control.  Emotionally good; even currently; this time of year is usually challenging for patient. 175mg  daily is right.  Doing well.     Review of Systems  Constitutional: Negative for activity change, appetite change, chills, diaphoresis,  fatigue, fever and unexpected weight change.  HENT: Negative for congestion, dental problem, drooling, ear discharge, ear pain, facial swelling, hearing loss, mouth sores, nosebleeds, postnasal drip, rhinorrhea, sinus pressure, sneezing, sore throat, tinnitus, trouble swallowing and voice change.   Eyes: Negative for photophobia, pain, discharge, redness, itching and visual disturbance.  Respiratory: Negative for apnea, cough, choking, chest tightness, shortness of breath, wheezing and stridor.   Cardiovascular: Negative for chest pain, palpitations and leg swelling.  Gastrointestinal: Negative for abdominal pain, blood in stool, constipation, diarrhea, nausea and vomiting.  Endocrine: Negative for cold intolerance, heat intolerance, polydipsia, polyphagia and polyuria.  Genitourinary: Negative for decreased urine volume, difficulty urinating, discharge, dysuria, enuresis, flank pain, frequency, genital sores, hematuria, penile pain, penile swelling, scrotal swelling, testicular pain and urgency.  Musculoskeletal: Positive for back pain and gait problem. Negative for arthralgias, joint swelling, myalgias, neck pain and neck stiffness.  Skin: Negative for color change, pallor, rash and wound.  Allergic/Immunologic: Negative for environmental allergies, food allergies and immunocompromised state.  Neurological: Negative for dizziness, tremors, seizures, syncope, facial asymmetry, speech difficulty, weakness, light-headedness, numbness and headaches.  Hematological: Negative for adenopathy. Does not bruise/bleed easily.  Psychiatric/Behavioral: Negative for agitation, behavioral problems, confusion, decreased concentration, dysphoric mood, hallucinations, self-injury, sleep disturbance and suicidal ideas. The patient is not nervous/anxious and is not hyperactive.        Bedtime 1100; wakes up 630.  Some insomnia but has improved.    Past Medical History:  Diagnosis Date  . Anxiety   . Bipolar  disorder ([place])  H/O  . BPH with urinary obstruction    s/p TURP  . Chronic kidney disease   . Chronic renal insufficiency, stage 2 (mild)    secondary to lithium toxicity; baseline creatinine 1.3-1.6  . Coronary artery disease   . Depression   . Hypertension    Past Surgical History:  Procedure Laterality Date  . HERNIA REPAIR    . PROSTATE SURGERY    . TRANSURETHRAL RESECTION OF PROSTATE     No Known Allergies Current Outpatient Medications on File Prior to Visit  Medication Sig Dispense Refill  . BABY ASPIRIN PO Take by mouth daily.     . rizatriptan (MAXALT) 10 MG tablet TAKE 1 TABLET (10 MG TOTAL) BY MOUTH AS NEEDED. MAY REPEAT IN 2 HRS IF NEEDED. 10 tablet 3   No current facility-administered medications on file prior to visit.    Social History   Socioeconomic History  . Marital status: Married    Spouse name: Not on file  . Number of children: 1  . Years of education: 16+  . Highest education level: Professional school degree (e.g., MD, DDS, DVM, JD)  Social Needs  . Financial resource strain: Not hard at all  . Food insecurity - worry: Never true  . Food insecurity - inability: Never true  . Transportation needs - medical: No  . Transportation needs - non-medical: No  Occupational History  . Occupation: retired    Comment: Music therapist  Tobacco Use  . Smoking status: Never Smoker  . Smokeless tobacco: Never Used  Substance and Sexual Activity  . Alcohol use: Yes    Alcohol/week: 1.2 oz    Types: 2 Standard drinks or equivalent per week    Comment: Occasional  . Drug use: No  . Sexual activity: Yes    Partners: Female    Birth control/protection: Post-menopausal  Other Topics Concern  . Not on file  Social History Narrative   Marital status: married x 48 years      Children: 1 daughter (54yo Professor [place]), no grandchildren.       Lives: with wife/[name]      Employment: retired at age 48 years of life; [place] x 12 years      Tobacco: never      Alcohol: rare in 2018; special occasions only      Exercise: walks 4-5 days per week also hikes 4 days per week 1-7 hours per day.  Swimming at [employer].       Advanced Directives; YES; full code but no prolonged measures.      ADLs: independent with ADLs in 2018; drives.   Family History  Problem Relation Age of Onset  . Cancer Mother        breast cancer  . Dementia Father   . Arthritis Sister        hip OA s/p hip replacement       Objective:    BP 136/80   Pulse 72   Temp 98 F (36.7 C) (Oral)   Resp 16   Ht 5' 11.65" (1.82 m)   Wt 244 lb (110.7 kg)   SpO2 95%   BMI 33.41 kg/m  Physical Exam  Constitutional: He is oriented to person, place, and time. He appears well-developed and well-nourished. No distress.  HENT:  Head: Normocephalic and atraumatic.  Right Ear: External ear normal.  Left Ear: External ear normal.  Nose: Nose normal.  Mouth/Throat: Oropharynx is clear and moist.  Eyes:  Conjunctivae and EOM are normal. Pupils are equal, round, and reactive to light.  Neck: Normal range of motion. Neck supple. Carotid bruit is not present. No thyromegaly present.  Cardiovascular: Normal rate, regular rhythm, normal heart sounds and intact distal pulses. Exam reveals no gallop and no friction rub.  No murmur heard. Pulmonary/Chest: Effort normal and breath sounds normal. He has no wheezes. He has no rales.  Abdominal: Soft. Bowel sounds are normal. He exhibits no distension and no mass. There is no tenderness. There is no rebound and no guarding.  Musculoskeletal:       Right shoulder: Normal.       Left shoulder: Normal.       Cervical back: Normal.  Lymphadenopathy:    He has no cervical adenopathy.  Neurological: He is alert and oriented to person, place, and time. He has normal reflexes. No cranial nerve deficit. He exhibits normal muscle tone. Coordination normal.  Skin: Skin is warm and dry. No rash noted. He is not  diaphoretic.  Psychiatric: He has a normal mood and affect. His behavior is normal. Judgment and thought content normal.  Nursing note and vitals reviewed.  No results found. Depression screen [facility] 2/9 08/11/2017 08/05/2017 06/28/2017 06/03/2017 03/28/2017  Decreased Interest 0 0 0 0 0  Down, Depressed, Hopeless 0 1 0 0 0  PHQ - 2 Score 0 1 0 0 0   Fall Risk  08/11/2017 08/05/2017 06/28/2017 06/10/2017 06/03/2017  Falls in the past year? No No No No No  Risk for fall due to : - - - Other (Comment) -        Assessment & Plan:   1. CKD (chronic kidney disease) stage 2 or early 3   2. Right bundle branch block   3. Migraine with aura and without status migrainosus, not intractable   4. Benign essential tremor   5. BPH with urinary obstruction   6. Combined arterial insufficiency and corporo-venous occlusive erectile dysfunction   7. Lithium nephropathy   8. Bipolar disorder, in full remission, most recent episode mixed ([place])   9. BMI 31.0-31.9,adult   10. Need for shingles vaccine   11. Atherosclerosis of aorta (HCC)     -anticipatory guidance provided --- exercise, weight loss, safe driving practices, aspirin 81mg  daily. -obtain age appropriate screening labs and labs for chronic disease management. -moderate fall risk; no evidence of depression; no evidence of hearing loss.  Discussed advanced directives and living will; also discussed end of life issues including code status.  -improvement in L sciatica with accupuncture therapy.  -bipolar disorder well controlled with current doses of medication. -obtain labs for chronic renal insufficiency; followed by urology every six months.   Orders Placed This Encounter  Procedures  . Lipid panel    Order Specific Question:   Has the patient fasted?    Answer:   No  . Comprehensive metabolic panel  . POCT urinalysis dipstick  . EKG 12-Lead   Meds ordered this encounter  Medications  . Zoster Vaccine Adjuvanted [facility]) injection     Sig: Inject 0.5 mLs into the muscle once for 1 dose.    Dispense:  0.5 mL    Refill:  1    Return in about 3 months (around 11/11/2017) for follow-up chronic medical conditions.   Willadeen Colantuono Elayne Guerin, M.D. Primary Care at [facility] previously [facility] [address] [place], [place]  [address] [phone] phone [phone] fax

## 2017-08-12 ENCOUNTER — Other Ambulatory Visit: Payer: Self-pay | Admitting: Family Medicine

## 2017-08-12 DIAGNOSIS — F3178 Bipolar disorder, in full remission, most recent episode mixed: Secondary | ICD-10-CM

## 2017-08-13 ENCOUNTER — Telehealth: Payer: Self-pay | Admitting: Family Medicine

## 2017-08-13 NOTE — Telephone Encounter (Signed)
Copied from [place] [id number]. Topic: Quick Communication - See Telephone Encounter >> Aug 13, 2017  3:35 PM Synthia Innocent wrote: CRM for notification. See Telephone encounter for:  Requesting refills on gabapentin (NEURONTIN) 100 MG capsule, lamoTRIgine (LAMICTAL) 150 and 25 MG tablet. [facility] 08/13/17.

## 2017-08-13 NOTE — Telephone Encounter (Signed)
Pt requesting these refills too early.

## 2017-08-14 MED ORDER — GABAPENTIN 100 MG PO CAPS: 100.0000 mg | ORAL_CAPSULE | Freq: Four times a day (QID) | ORAL | 5 refills | Status: DC

## 2017-08-14 MED ORDER — LAMOTRIGINE 25 MG PO TABS: 25.0000 mg | ORAL_TABLET | Freq: Every day | ORAL | 1 refills | Status: DC

## 2017-08-14 MED ORDER — LAMOTRIGINE 150 MG PO TABS: 150.0000 mg | ORAL_TABLET | Freq: Every day | ORAL | 1 refills | Status: DC

## 2017-08-14 NOTE — Telephone Encounter (Signed)
Medication request; pt recently seen in office; please send new prescription if appropriate; see CRM # [phone]

## 2017-08-14 NOTE — Telephone Encounter (Signed)
Sent refill request to Dr. Tamala Julian Gabapentin -100mg  - pt take up to 12 per day Lamictal 150mg  last refill in Epic 2017 Lamictal 25mg  last refill sent 06/28/2017 #90 with 1 RF

## 2017-08-15 ENCOUNTER — Encounter: Payer: Self-pay | Admitting: Family Medicine

## 2017-08-26 DIAGNOSIS — H2513 Age-related nuclear cataract, bilateral: Secondary | ICD-10-CM | POA: Diagnosis not present

## 2017-09-08 DIAGNOSIS — M545 Low back pain: Secondary | ICD-10-CM | POA: Diagnosis not present

## 2017-09-08 DIAGNOSIS — M5416 Radiculopathy, lumbar region: Secondary | ICD-10-CM | POA: Diagnosis not present

## 2017-09-08 DIAGNOSIS — M47816 Spondylosis without myelopathy or radiculopathy, lumbar region: Secondary | ICD-10-CM | POA: Diagnosis not present

## 2017-09-15 ENCOUNTER — Encounter: Payer: Medicare Other | Admitting: Family Medicine

## 2017-09-25 ENCOUNTER — Encounter: Payer: Self-pay | Admitting: Family Medicine

## 2017-09-25 DIAGNOSIS — M5416 Radiculopathy, lumbar region: Secondary | ICD-10-CM | POA: Diagnosis not present

## 2017-09-25 DIAGNOSIS — M47816 Spondylosis without myelopathy or radiculopathy, lumbar region: Secondary | ICD-10-CM | POA: Diagnosis not present

## 2017-09-25 DIAGNOSIS — M545 Low back pain: Secondary | ICD-10-CM | POA: Diagnosis not present

## 2017-10-14 DIAGNOSIS — M47816 Spondylosis without myelopathy or radiculopathy, lumbar region: Secondary | ICD-10-CM | POA: Diagnosis not present

## 2017-10-14 DIAGNOSIS — M545 Low back pain: Secondary | ICD-10-CM | POA: Diagnosis not present

## 2017-10-14 DIAGNOSIS — M5416 Radiculopathy, lumbar region: Secondary | ICD-10-CM | POA: Diagnosis not present

## 2017-10-17 ENCOUNTER — Encounter: Payer: Self-pay | Admitting: Family Medicine

## 2017-10-23 DIAGNOSIS — M5416 Radiculopathy, lumbar region: Secondary | ICD-10-CM | POA: Diagnosis not present

## 2017-10-23 DIAGNOSIS — M545 Low back pain: Secondary | ICD-10-CM | POA: Diagnosis not present

## 2017-10-23 DIAGNOSIS — M47816 Spondylosis without myelopathy or radiculopathy, lumbar region: Secondary | ICD-10-CM | POA: Diagnosis not present

## 2017-10-24 ENCOUNTER — Encounter: Payer: Self-pay | Admitting: Family Medicine

## 2017-10-27 ENCOUNTER — Other Ambulatory Visit: Payer: Self-pay | Admitting: Family Medicine

## 2017-10-28 NOTE — Telephone Encounter (Signed)
Maxalt refill request Last OV 08/11/17 with Dr. Tamala [facility] [address], [place]  [place]

## 2017-11-08 ENCOUNTER — Other Ambulatory Visit: Payer: Self-pay | Admitting: Family Medicine

## 2017-11-10 DIAGNOSIS — M47816 Spondylosis without myelopathy or radiculopathy, lumbar region: Secondary | ICD-10-CM | POA: Diagnosis not present

## 2017-11-10 DIAGNOSIS — M25562 Pain in left knee: Secondary | ICD-10-CM | POA: Diagnosis not present

## 2017-11-10 DIAGNOSIS — M545 Low back pain: Secondary | ICD-10-CM | POA: Diagnosis not present

## 2017-11-10 DIAGNOSIS — M5416 Radiculopathy, lumbar region: Secondary | ICD-10-CM | POA: Diagnosis not present

## 2017-11-11 NOTE — Progress Notes (Signed)
Subjective:    Patient ID: Christian Parsons, male    DOB: [birth year], 72 y.o.   MRN: [id number]  11/12/2017  Chronic Conditions (3 month follow-up )    HPI This 72 y.o. male presents for evaluation of LEFT SCIATICA chronic renal insufficiency, and bipolar disorder.  S/p second injection on 10/21/2017 by [place]. Next injection 11/18/17 for third appointment. Initial injection 09/15/2017.  Second injection has been a real success.  Hiking moderate hikes again; when hiking, not wishing was doing something else.  Hiked yesterday 5 miles; tortuous trail with roots; got 3/4 mile to the end and sped up.  Race to the end.  Able to increase the pace.  Plans to go Friday to [place] which is a big challenge.  Christian Parsons's Christian Parsons trail which is 6 miles; now a tilt; 900 feet.  Then can hang a left for another trail; will not hike that this week but hope to hike it in one month.  Christian Parsons and Christian Parsons are both hikers who offered to hike with patient.  There is an eleven mile hike that runs parallel to [facility].  Can see [place] spreading out. Does still have creaking upon awakening.  Yesterday was the first time that hiked without pain.  The day before had a lot of pain.  Underwent massage and was really sore.  Massage was Monday; hiking Monday right after the massage.  Pain with hiking after massage.  Tuesday, did same hike without pain.   Legs are symmetric.    Rash: l medial ankle; taking gabapentin 800mg  four times daily.  Am, lunch, dinner, bedtime. When sticks to that, feels better.   Has lost six pounds since second injection.  Wants to hike four times per week.   There is a knee injection that may benefit knee pain.   Slightly fuzzy headed with financial presentation.    First quarter, dark but increasing. Second quarter, increasing light; best! Third quarter, lots of light and decreasing. Fourth quarter, decreasing light. [place] has much less variation.  Lattitude has a major affect. Four seasons.    Wants to move to [place] yet wife will not do it. 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   Heritage in the [place]; climate is disaster.   BP Readings from Last 3 Encounters:  11/12/17 130/82  08/11/17 136/80  08/05/17 130/80   Wt Readings from Last 3 Encounters:  11/12/17 238 lb (108 kg)  08/11/17 244 lb (110.7 kg)  08/05/17 245 lb 2 oz (111.2 kg)   Immunization History  Administered Date(s) Administered  . Influenza Split 07/24/2012  . Influenza,inj,Quad PF,6+ Mos 06/16/2013, 05/25/2014, 06/18/2015, 07/30/2016, 06/03/2017  . Pneumococcal Conjugate-13 07/30/2016  . Pneumococcal Polysaccharide-23 11/07/2010, 08/05/2017  . Td 01/15/2004  . Tdap 06/16/2013    Review of Systems  Constitutional: Negative for activity change, appetite change, chills, diaphoresis, fatigue and fever.  Respiratory: Negative for cough and shortness of breath.   Cardiovascular: Negative for chest pain, palpitations and leg swelling.  Gastrointestinal: Negative for abdominal pain, diarrhea, nausea and vomiting.  Endocrine: Negative for cold intolerance, heat intolerance, polydipsia, polyphagia and polyuria.  Musculoskeletal: Positive for arthralgias, back pain and gait problem.  Skin: Negative for color change, rash and wound.  Neurological: Negative for dizziness, tremors, seizures, syncope, facial asymmetry, speech difficulty, weakness, light-headedness, numbness and headaches.  Psychiatric/Behavioral: Negative for dysphoric mood, self-injury, sleep disturbance and suicidal ideas. The patient is not nervous/anxious.     Past Medical History:  Diagnosis Date  .  Anxiety   . Bipolar disorder ([place])    H/O  . BPH with urinary obstruction    s/p TURP  . Chronic kidney disease   . Chronic renal insufficiency, stage 2 (mild)    secondary to lithium toxicity; baseline creatinine 1.3-1.6  . Coronary artery disease   . Depression   . Hypertension    Past Surgical History:  Procedure Laterality  Date  . HERNIA REPAIR    . PROSTATE SURGERY    . TRANSURETHRAL RESECTION OF PROSTATE     No Known Allergies Current Outpatient Medications on File Prior to Visit  Medication Sig Dispense Refill  . BABY ASPIRIN PO Take by mouth daily.     . rizatriptan (MAXALT) 10 MG tablet TAKE 1 TABLET (10 MG TOTAL) BY MOUTH AS NEEDED. MAY REPEAT IN 2 HRS IF NEEDED. 10 tablet 3   No current facility-administered medications on file prior to visit.    Social History   Socioeconomic History  . Marital status: Married    Spouse name: Not on file  . Number of children: 1  . Years of education: 16+  . Highest education level: Professional school degree (e.g., MD, DDS, DVM, JD)  Social Needs  . Financial resource strain: Not hard at all  . Food insecurity - worry: Never true  . Food insecurity - inability: Never true  . Transportation needs - medical: No  . Transportation needs - non-medical: No  Occupational History  . Occupation: retired    Comment: Music therapist  Tobacco Use  . Smoking status: Never Smoker  . Smokeless tobacco: Never Used  Substance and Sexual Activity  . Alcohol use: Yes    Alcohol/week: 1.2 oz    Types: 2 Standard drinks or equivalent per week    Comment: Occasional  . Drug use: No  . Sexual activity: Yes    Partners: Female    Birth control/protection: Post-menopausal  Other Topics Concern  . Not on file  Social History Narrative   Marital status: married x 48 years      Children: 1 daughter (39yo Professor Christian Parsons), no grandchildren.       Lives: with wife/Christian Parsons      Employment: retired at age 80 years of life; [place] x 12 years      Tobacco: never      Alcohol: rare in 2018; special occasions only      Exercise: walks 4-5 days per week also hikes 4 days per week 1-7 hours per day.  Swimming at [employer].       Advanced Directives; YES; full code but no prolonged measures.      ADLs: independent with ADLs in 2018; drives.    Family History  Problem Relation Age of Onset  . Cancer Mother        breast cancer  . Dementia Father   . Arthritis Sister        hip OA s/p hip replacement       Objective:    BP 130/82   Pulse 76   Temp 98 F (36.7 C) (Oral)   Resp 16   Ht 5' 11.65" (1.82 m)   Wt 238 lb (108 kg)   SpO2 94%   BMI 32.59 kg/m  Physical Exam  Constitutional: He is oriented to person, place, and time. He appears well-developed and well-nourished. No distress.  HENT:  Head: Normocephalic and atraumatic.  Right Ear: External ear normal.  Left Ear: External ear normal.  Nose:  Nose normal.  Mouth/Throat: Oropharynx is clear and moist.  Eyes: Conjunctivae and EOM are normal. Pupils are equal, round, and reactive to light.  Neck: Normal range of motion. Neck supple. Carotid bruit is not present. No thyromegaly present.  Cardiovascular: Normal rate, regular rhythm, normal heart sounds and intact distal pulses. Exam reveals no gallop and no friction rub.  No murmur heard. Pulmonary/Chest: Effort normal and breath sounds normal. He has no wheezes. He has no rales.  Abdominal: Soft. Bowel sounds are normal. He exhibits no distension and no mass. There is no tenderness. There is no rebound and no guarding.  Lymphadenopathy:    He has no cervical adenopathy.  Neurological: He is alert and oriented to person, place, and time. No cranial nerve deficit. He exhibits normal muscle tone. Coordination normal.  Skin: Skin is warm and dry. Rash noted. He is not diaphoretic.  Residual rash lower extremity without vesicles or pustules.  No erythema at this time.  Psychiatric: He has a normal mood and affect. His behavior is normal. Judgment and thought content normal.  Nursing note and vitals reviewed.  No results found. Depression screen [facility] 2/9 11/12/2017 08/11/2017 08/05/2017 06/28/2017 06/03/2017  Decreased Interest 0 0 0 0 0  Down, Depressed, Hopeless 0 0 1 0 0  PHQ - 2 Score 0 0 1 0 0   Fall Risk   11/12/2017 08/11/2017 08/05/2017 06/28/2017 06/10/2017  Falls in the past year? No No No No No  Risk for fall due to : - - - - Other (Comment)        Assessment & Plan:   1. CKD (chronic kidney disease) stage 2 or early 3   2. Bipolar disorder, in full remission, most recent episode mixed ([place])   3. Hypercholesterolemia   4. Atherosclerosis of aorta (HCC) Chronic  5. Lithium nephropathy   6. Left sided sciatica     Stage II-III: Chronic kidney disease stable.  Repeat creatinine at this time.  Avoid nephrotoxic drugs.  Followed by nephrology annually.  Bipolar disorder: Stable at this time.  Suffers with seasonal affective disorder.  Did quite well this winter.  Refills of Lamictal therapy provided.  Left sciatica: Improved.  Status post second injection with great improvement in symptoms.  Patient now able to hike throughout the week which is a huge satisfaction for patient and myself.  Discussed weaning gabapentin therapy slowly.  Has upcoming appointment with orthopedics and will defer weaning up until appointment.  Hypercholesterolemia with atherosclerosis of aorta: Stable.  Obtain lipid panel today.  Orders Placed This Encounter  Procedures  . CBC with Differential/Platelet  . Comprehensive metabolic panel    Order Specific Question:   Has the patient fasted?    Answer:   No  . Lipid panel    Order Specific Question:   Has the patient fasted?    Answer:   No   Meds ordered this encounter  Medications  . gabapentin (NEURONTIN) 100 MG capsule    Sig: Take 2 capsules (200 mg total) by mouth 4 (four) times daily.    Dispense:  240 capsule    Refill:  5  . lamoTRIgine (LAMICTAL) 150 MG tablet    Sig: Take 1 tablet (150 mg total) by mouth daily.    Dispense:  90 tablet    Refill:  1  . lamoTRIgine (LAMICTAL) 25 MG tablet    Sig: Take 1-2 tablets (25-50 mg total) by mouth daily.    Dispense:  120 tablet  Refill:  1    Return in about 3 months (around 02/09/2018) for  follow-up chronic medical conditions.   Kristi Elayne Guerin, M.D. Primary Care at [facility] previously [facility] [address] [place], [place]  [address] [phone] phone [phone] fax

## 2017-11-12 ENCOUNTER — Ambulatory Visit (INDEPENDENT_AMBULATORY_CARE_PROVIDER_SITE_OTHER): Payer: Medicare Other | Admitting: Family Medicine

## 2017-11-12 ENCOUNTER — Other Ambulatory Visit: Payer: Self-pay

## 2017-11-12 ENCOUNTER — Encounter: Payer: Self-pay | Admitting: Family Medicine

## 2017-11-12 VITALS — BP 130/82 | HR 76 | Temp 98.0°F | Resp 16 | Ht 71.65 in | Wt 238.0 lb

## 2017-11-12 DIAGNOSIS — E7800 Pure hypercholesterolemia, unspecified: Secondary | ICD-10-CM | POA: Diagnosis not present

## 2017-11-12 DIAGNOSIS — F3178 Bipolar disorder, in full remission, most recent episode mixed: Secondary | ICD-10-CM | POA: Diagnosis not present

## 2017-11-12 DIAGNOSIS — N141 Nephropathy induced by other drugs, medicaments and biological substances: Secondary | ICD-10-CM

## 2017-11-12 DIAGNOSIS — I700 Atherosclerosis of aorta: Secondary | ICD-10-CM

## 2017-11-12 DIAGNOSIS — N182 Chronic kidney disease, stage 2 (mild): Secondary | ICD-10-CM | POA: Diagnosis not present

## 2017-11-12 DIAGNOSIS — T56891A Toxic effect of other metals, accidental (unintentional), initial encounter: Secondary | ICD-10-CM | POA: Diagnosis not present

## 2017-11-12 DIAGNOSIS — M5432 Sciatica, left side: Secondary | ICD-10-CM

## 2017-11-12 MED ORDER — LAMOTRIGINE 25 MG PO TABS: 25.0000 mg | ORAL_TABLET | Freq: Every day | ORAL | 1 refills | Status: DC

## 2017-11-12 MED ORDER — LAMOTRIGINE 150 MG PO TABS: 150.0000 mg | ORAL_TABLET | Freq: Every day | ORAL | 1 refills | Status: DC

## 2017-11-12 MED ORDER — GABAPENTIN 100 MG PO CAPS: 200.0000 mg | ORAL_CAPSULE | Freq: Four times a day (QID) | ORAL | 5 refills | Status: DC

## 2017-11-12 NOTE — Patient Instructions (Signed)
     IF you received an x-ray today, you will receive an invoice from [facility]. Please contact [facility] at [phone] with questions or concerns regarding your invoice.   IF you received labwork today, you will receive an invoice from [facility]. Please contact [facility] at [phone] with questions or concerns regarding your invoice.   Our billing staff will not be able to assist you with questions regarding bills from these companies.  You will be contacted with the lab results as soon as they are available. The fastest way to get your results is to activate your My Chart account. Instructions are located on the last page of this paperwork. If you have not heard from us regarding the results in 2 weeks, please contact this office.     

## 2017-11-13 LAB — COMPREHENSIVE METABOLIC PANEL
A/G RATIO: 1.7 (ref 1.2–2.2)
ALBUMIN: 4.7 g/dL (ref 3.6–4.8)
ALT: 33 IU/L (ref 0–44)
AST: 24 IU/L (ref 0–40)
Alkaline Phosphatase: 88 IU/L (ref 39–117)
BUN / CREAT RATIO: 16 (ref 10–24)
BUN: 24 mg/dL (ref 8–27)
Bilirubin Total: 0.7 mg/dL (ref 0.0–1.2)
CALCIUM: 9.8 mg/dL (ref 8.6–10.2)
CO2: 20 mmol/L (ref 20–29)
Chloride: 107 mmol/L — ABNORMAL HIGH (ref 96–106)
Creatinine, Ser: 1.5 mg/dL — ABNORMAL HIGH (ref 0.76–1.27)
GFR, EST AFRICAN AMERICAN: 54 mL/min/{1.73_m2} — AB (ref >59)
GFR, EST NON AFRICAN AMERICAN: 47 mL/min/{1.73_m2} — AB (ref >59)
GLOBULIN, TOTAL: 2.7 g/dL (ref 1.5–4.5)
Glucose: 100 mg/dL — ABNORMAL HIGH (ref 65–99)
POTASSIUM: 4.8 mmol/L (ref 3.5–5.2)
SODIUM: 147 mmol/L — AB (ref 134–144)
Total Protein: 7.4 g/dL (ref 6.0–8.5)

## 2017-11-13 LAB — CBC WITH DIFFERENTIAL/PLATELET
BASOS: 0 %
Basophils Absolute: 0 10*3/uL (ref 0.0–0.2)
EOS (ABSOLUTE): 0.1 10*3/uL (ref 0.0–0.4)
EOS: 2 %
HEMATOCRIT: 45.1 % (ref 37.5–51.0)
Hemoglobin: 15.8 g/dL (ref 13.0–17.7)
IMMATURE GRANULOCYTES: 0 %
Immature Grans (Abs): 0 10*3/uL (ref 0.0–0.1)
Lymphocytes Absolute: 0.9 10*3/uL (ref 0.7–3.1)
Lymphs: 19 %
MCH: 31.3 pg (ref 26.6–33.0)
MCHC: 35 g/dL (ref 31.5–35.7)
MCV: 90 fL (ref 79–97)
MONOS ABS: 0.4 10*3/uL (ref 0.1–0.9)
Monocytes: 8 %
NEUTROS PCT: 71 %
Neutrophils Absolute: 3.3 10*3/uL (ref 1.4–7.0)
Platelets: 167 10*3/uL (ref 150–379)
RBC: 5.04 x10E6/uL (ref 4.14–5.80)
RDW: 14 % (ref 12.3–15.4)
WBC: 4.8 10*3/uL (ref 3.4–10.8)

## 2017-11-13 LAB — LIPID PANEL
CHOL/HDL RATIO: 4.4 ratio (ref 0.0–5.0)
Cholesterol, Total: 135 mg/dL (ref 100–199)
HDL: 31 mg/dL — ABNORMAL LOW (ref >39)
LDL Calculated: 81 mg/dL (ref 0–99)
Triglycerides: 115 mg/dL (ref 0–149)
VLDL Cholesterol Cal: 23 mg/dL (ref 5–40)

## 2017-11-18 DIAGNOSIS — M5432 Sciatica, left side: Secondary | ICD-10-CM | POA: Insufficient documentation

## 2017-11-18 DIAGNOSIS — M545 Low back pain: Secondary | ICD-10-CM | POA: Diagnosis not present

## 2017-11-18 DIAGNOSIS — M5416 Radiculopathy, lumbar region: Secondary | ICD-10-CM | POA: Diagnosis not present

## 2017-11-18 DIAGNOSIS — M25562 Pain in left knee: Secondary | ICD-10-CM | POA: Diagnosis not present

## 2017-11-18 DIAGNOSIS — M47816 Spondylosis without myelopathy or radiculopathy, lumbar region: Secondary | ICD-10-CM | POA: Diagnosis not present

## 2017-11-18 DIAGNOSIS — I700 Atherosclerosis of aorta: Secondary | ICD-10-CM | POA: Insufficient documentation

## 2017-11-19 ENCOUNTER — Encounter: Payer: Self-pay | Admitting: Family Medicine

## 2017-12-08 DIAGNOSIS — M1712 Unilateral primary osteoarthritis, left knee: Secondary | ICD-10-CM | POA: Diagnosis not present

## 2017-12-21 ENCOUNTER — Encounter: Payer: Self-pay | Admitting: Family Medicine

## 2018-01-03 ENCOUNTER — Other Ambulatory Visit: Payer: Self-pay | Admitting: Family Medicine

## 2018-01-05 DIAGNOSIS — H1013 Acute atopic conjunctivitis, bilateral: Secondary | ICD-10-CM | POA: Diagnosis not present

## 2018-01-05 NOTE — Telephone Encounter (Signed)
Last filled: 11/12/17 120 tab/1 refill Last OV:11/12/17 PCP: Zannie Cove. [id number] [place], [address] [phone] (Phone) [phone] (Fax)

## 2018-01-07 ENCOUNTER — Ambulatory Visit (INDEPENDENT_AMBULATORY_CARE_PROVIDER_SITE_OTHER): Payer: Medicare Other | Admitting: Family Medicine

## 2018-01-07 ENCOUNTER — Encounter: Payer: Self-pay | Admitting: Family Medicine

## 2018-01-07 ENCOUNTER — Other Ambulatory Visit: Payer: Self-pay

## 2018-01-07 VITALS — BP 130/84 | HR 79 | Temp 98.5°F | Resp 17 | Ht 71.65 in | Wt 242.8 lb

## 2018-01-07 DIAGNOSIS — J069 Acute upper respiratory infection, unspecified: Secondary | ICD-10-CM

## 2018-01-07 NOTE — Progress Notes (Signed)
Chief Complaint  Patient presents with  . chest congestion, goo in eyes x 2 nights that have caused vi    feeling bad x past week, pt has asbestos in house and is wondering if this is related to his symptoms    HPI   Patient reports that he has been having chest congestion, and cough that is productive of yellow-green sticky mucus. He states that he eyes burns and feel gooy He has been using eye drops tobradex one dry qid The eye drops help No fevers or chills  He reports that he is taking honey as well. He reports that he is having work done on the home and there is asbestos in the house.    Past Medical History:  Diagnosis Date  . Anxiety   . Bipolar disorder ([place])    H/O  . BPH with urinary obstruction    s/p TURP  . Chronic kidney disease   . Chronic renal insufficiency, stage 2 (mild)    secondary to lithium toxicity; baseline creatinine 1.3-1.6  . Coronary artery disease   . Depression   . Hypertension     Current Outpatient Medications  Medication Sig Dispense Refill  . BABY ASPIRIN PO Take by mouth daily.     Marland Kitchen gabapentin (NEURONTIN) 100 MG capsule Take 2 capsules (200 mg total) by mouth 4 (four) times daily. (Patient taking differently: Take 200 mg by mouth 3 (three) times daily. Taking 3 capsules daily) 240 capsule 5  . lamoTRIgine (LAMICTAL) 150 MG tablet Take 1 tablet (150 mg total) by mouth daily. 90 tablet 1  . lamoTRIgine (LAMICTAL) 25 MG tablet Take 1-2 tablets (25-50 mg total) by mouth daily. 120 tablet 1  . rizatriptan (MAXALT) 10 MG tablet TAKE 1 TABLET (10 MG TOTAL) BY MOUTH AS NEEDED. MAY REPEAT IN 2 HRS IF NEEDED. 10 tablet 3   No current facility-administered medications for this visit.     Allergies: No Known Allergies  Past Surgical History:  Procedure Laterality Date  . HERNIA REPAIR    . PROSTATE SURGERY    . TRANSURETHRAL RESECTION OF PROSTATE      Social History   Socioeconomic History  . Marital status: Married    Spouse name:  Not on file  . Number of children: 1  . Years of education: 16+  . Highest education level: Professional school degree (e.g., MD, DDS, DVM, JD)  Occupational History  . Occupation: retired    Comment: Music therapist  Social Needs  . Financial resource strain: Not hard at all  . Food insecurity:    Worry: Never true    Inability: Never true  . Transportation needs:    Medical: No    Non-medical: No  Tobacco Use  . Smoking status: Never Smoker  . Smokeless tobacco: Never Used  Substance and Sexual Activity  . Alcohol use: Yes    Alcohol/week: 1.2 oz    Types: 2 Standard drinks or equivalent per week    Comment: Occasional  . Drug use: No  . Sexual activity: Yes    Partners: Female    Birth control/protection: Post-menopausal  Lifestyle  . Physical activity:    Days per week: 0 days    Minutes per session: 0 min  . Stress: To some extent  Relationships  . Social connections:    Talks on phone: More than three times a week    Gets together: More than three times a week    Attends religious service: More than  4 times per year    Active member of club or organization: Yes    Attends meetings of clubs or organizations: More than 4 times per year    Relationship status: Married  Other Topics Concern  . Not on file  Social History Narrative   Marital status: married x 48 years      Children: 1 daughter (14yo Professor Kieffer Blatz), no grandchildren.       Lives: with wife/[name]      Employment: retired at age 77 years of life; [place] x 12 years      Tobacco: never      Alcohol: rare in 2018; special occasions only      Exercise: walks 4-5 days per week also hikes 4 days per week 1-7 hours per day.  Swimming at [employer].       Advanced Directives; YES; full code but no prolonged measures.      ADLs: independent with ADLs in 2018; drives.    Family History  Problem Relation Age of Onset  . Cancer Mother        breast cancer  . Dementia Father    . Arthritis Sister        hip OA s/p hip replacement     ROS Review of Systems See HPI Constitution: No fevers or chills No malaise No diaphoresis Skin: No rash or itching Eyes: no blurry vision, no double vision GU: no dysuria or hematuria Neuro: no dizziness or headaches all others reviewed and negative   Objective: Vitals:   01/07/18 1324  BP: 130/84  Pulse: 79  Resp: 17  Temp: 98.5 F (36.9 C)  TempSrc: Oral  SpO2: 95%  Weight: 242 lb 12.8 oz (110.1 kg)  Height: 5' 11.65" (1.82 m)    Physical Exam General: alert, oriented, in NAD Head: normocephalic, atraumatic, no sinus tenderness Eyes: EOM intact, no scleral icterus or conjunctival injection Ears: TM clear bilaterally Nose: mucosa nonerythematous, nonedematous Throat: no pharyngeal exudate or erythema Lymph: no posterior auricular, submental or cervical lymph adenopathy Heart: normal rate, normal sinus rhythm, no murmurs Lungs: clear to auscultation bilaterally, no wheezing   Assessment and Plan Brandi was seen today for chest congestion, goo in eyes x 2 nights that have caused vi.  Diagnoses and all orders for this visit:  Acute URI   Continue hydration Continue honey Try lozenges for the throat as long as they are menthol free    [place]

## 2018-01-07 NOTE — Patient Instructions (Addendum)
Check you cough drops and check that they are menthol free    IF you received an x-ray today, you will receive an invoice from [facility]. Please contact [facility] at [phone] with questions or concerns regarding your invoice.   IF you received labwork today, you will receive an invoice from [facility]. Please contact [facility] at [phone] with questions or concerns regarding your invoice.   Our billing staff will not be able to assist you with questions regarding bills from these companies.  You will be contacted with the lab results as soon as they are available. The fastest way to get your results is to activate your My Chart account. Instructions are located on the last page of this paperwork. If you have not heard from [place] regarding the results in 2 weeks, please contact this office.     Viral Respiratory Infection A respiratory infection is an illness that affects part of the respiratory system, such as the lungs, nose, or throat. Most respiratory infections are caused by either viruses or bacteria. A respiratory infection that is caused by a virus is called a viral respiratory infection. Common types of viral respiratory infections include:  A cold.  The flu (influenza).  A respiratory syncytial virus (RSV) infection.  How do I know if I have a viral respiratory infection? Most viral respiratory infections cause:  A stuffy or runny nose.  Yellow or green nasal discharge.  A cough.  Sneezing.  Fatigue.  Achy muscles.  A sore throat.  Sweating or chills.  A fever.  A headache.  How are viral respiratory infections treated? If influenza is diagnosed early, it may be treated with an antiviral medicine that shortens the length of time a person has symptoms. Symptoms of viral respiratory infections may be treated with over-the-counter and prescription medicines, such as:  Expectorants. These make it easier to cough up  mucus.  Decongestant nasal sprays.  Health care providers do not prescribe antibiotic medicines for viral infections. This is because antibiotics are designed to kill bacteria. They have no effect on viruses. How do I know if I should stay home from work or school? To avoid exposing others to your respiratory infection, stay home if you have:  A fever.  A persistent cough.  A sore throat.  A runny nose.  Sneezing.  Muscles aches.  Headaches.  Fatigue.  Weakness.  Chills.  Sweating.  Nausea.  Follow these instructions at home:  Rest as much as possible.  Take over-the-counter and prescription medicines only as told by your health care provider.  Drink enough fluid to keep your urine clear or pale yellow. This helps prevent dehydration and helps loosen up mucus.  Gargle with a salt-water mixture 3-4 times per day or as needed. To make a salt-water mixture, completely dissolve -1 tsp of salt in 1 cup of warm water.  Use nose drops made from salt water to ease congestion and soften raw skin around your nose.  Do not drink alcohol.  Do not use tobacco products, including cigarettes, chewing tobacco, and e-cigarettes. If you need help quitting, ask your health care provider. Contact a health care provider if:  Your symptoms last for 10 days or longer.  Your symptoms get worse over time.  You have a fever.  You have severe sinus pain in your face or forehead.  The glands in your jaw or neck become very swollen. Get help right away if:  You feel pain or pressure in your chest.  You  have shortness of breath.  You faint or feel like you will faint.  You have severe and persistent vomiting.  You feel confused or disoriented. This information is not intended to replace advice given to you by your health care provider. Make sure you discuss any questions you have with your health care provider. Document Released: 06/12/2005 Document Revised: 02/08/2016  Document Reviewed: 02/08/2015 Elsevier Interactive Patient Education  [employer].

## 2018-01-12 ENCOUNTER — Encounter: Payer: Self-pay | Admitting: Family Medicine

## 2018-01-12 ENCOUNTER — Encounter (HOSPITAL_COMMUNITY): Payer: Self-pay | Admitting: Emergency Medicine

## 2018-01-12 ENCOUNTER — Ambulatory Visit (HOSPITAL_COMMUNITY)
Admission: EM | Admit: 2018-01-12 | Discharge: 2018-01-12 | Disposition: A | Discharge status: Home / Self Care | Payer: Medicare Other | Attending: Family Medicine | Admitting: Family Medicine

## 2018-01-12 DIAGNOSIS — N3001 Acute cystitis with hematuria: Secondary | ICD-10-CM | POA: Diagnosis not present

## 2018-01-12 DIAGNOSIS — I129 Hypertensive chronic kidney disease with stage 1 through stage 4 chronic kidney disease, or unspecified chronic kidney disease: Secondary | ICD-10-CM | POA: Insufficient documentation

## 2018-01-12 DIAGNOSIS — R3129 Other microscopic hematuria: Secondary | ICD-10-CM

## 2018-01-12 DIAGNOSIS — I2510 Atherosclerotic heart disease of native coronary artery without angina pectoris: Secondary | ICD-10-CM | POA: Diagnosis not present

## 2018-01-12 DIAGNOSIS — Z7982 Long term (current) use of aspirin: Secondary | ICD-10-CM | POA: Diagnosis not present

## 2018-01-12 DIAGNOSIS — N183 Chronic kidney disease, stage 3 unspecified: Secondary | ICD-10-CM

## 2018-01-12 DIAGNOSIS — R1030 Lower abdominal pain, unspecified: Secondary | ICD-10-CM

## 2018-01-12 DIAGNOSIS — H1013 Acute atopic conjunctivitis, bilateral: Secondary | ICD-10-CM | POA: Diagnosis not present

## 2018-01-12 LAB — POCT URINALYSIS DIP (DEVICE)
BILIRUBIN URINE: NEGATIVE
GLUCOSE, UA: NEGATIVE mg/dL
Ketones, ur: NEGATIVE mg/dL
NITRITE: POSITIVE — AB
Protein, ur: 30 mg/dL — AB
Specific Gravity, Urine: 1.015 (ref 1.005–1.030)
Urobilinogen, UA: 0.2 mg/dL (ref 0.0–1.0)
pH: 7.5 (ref 5.0–8.0)

## 2018-01-12 MED ORDER — CEPHALEXIN 500 MG PO CAPS: 500.0000 mg | ORAL_CAPSULE | Freq: Two times a day (BID) | ORAL | 0 refills | Status: DC

## 2018-01-12 NOTE — ED Triage Notes (Signed)
Pt sts UTI sx x 2 days

## 2018-01-12 NOTE — ED Provider Notes (Signed)
MRN: [id number] DOB: 1945/09/19  Subjective:   Christian Parsons is a 72 y.o. male presenting for 2 day history of dysuria, urinary frequency, urinary urgency and cloudy malordorous urine. Has tried hydrating aggressively but has not used any medications for relief. Denies fever, hematuria, urinary frequency, flank pain and abdominal pain, nausea, vomiting and Confusion, dizziness.  Patient does have a urologist, had a prostate surgery about 2 years ago and has had difficulty with his urination and UTI symptoms.  He has not set a follow-up with his urologist.  No current facility-administered medications for this encounter.   Current Outpatient Medications:  .  BABY ASPIRIN PO, Take by mouth daily. , Disp: , Rfl:  .  gabapentin (NEURONTIN) 100 MG capsule, Take 2 capsules (200 mg total) by mouth 4 (four) times daily. (Patient taking differently: Take 200 mg by mouth 3 (three) times daily. Taking 3 capsules daily), Disp: 240 capsule, Rfl: 5 .  lamoTRIgine (LAMICTAL) 150 MG tablet, Take 1 tablet (150 mg total) by mouth daily., Disp: 90 tablet, Rfl: 1 .  lamoTRIgine (LAMICTAL) 25 MG tablet, Take 1-2 tablets (25-50 mg total) by mouth daily., Disp: 120 tablet, Rfl: 1 .  rizatriptan (MAXALT) 10 MG tablet, TAKE 1 TABLET (10 MG TOTAL) BY MOUTH AS NEEDED. MAY REPEAT IN 2 HRS IF NEEDED., Disp: 10 tablet, Rfl: 3   No Known Allergies  Past Medical History:  Diagnosis Date  . Anxiety   . Bipolar disorder ([place])    H/O  . BPH with urinary obstruction    s/p TURP  . Chronic kidney disease   . Chronic renal insufficiency, stage 2 (mild)    secondary to lithium toxicity; baseline creatinine 1.3-1.6  . Coronary artery disease   . Depression   . Hypertension      Past Surgical History:  Procedure Laterality Date  . HERNIA REPAIR    . PROSTATE SURGERY    . TRANSURETHRAL RESECTION OF PROSTATE      Objective:   Vitals: BP (!) 171/96 (BP Location: Left Arm)   Pulse 74   Temp 97.8 F (36.6 C) (Oral)    Resp 18   SpO2 100%   Physical Exam  Constitutional: He is oriented to person, place, and time. He appears well-developed and well-nourished.  HENT:  Mouth/Throat: Oropharynx is clear and moist.  Cardiovascular: Normal rate, regular rhythm and intact distal pulses. Exam reveals no gallop and no friction rub.  No murmur heard. Pulmonary/Chest: No respiratory distress. He has no wheezes. He has no rales.  Abdominal: Soft. Bowel sounds are normal. He exhibits no distension and no mass. There is no tenderness. There is no rebound and no guarding.  No CVA tenderness.  Neurological: He is alert and oriented to person, place, and time.  Skin: Skin is warm and dry.  Psychiatric: He has a normal mood and affect.    Results for orders placed or performed during the hospital encounter of 01/12/18 (from the past 24 hour(s))  POCT urinalysis dip (device)     Status: Abnormal   Collection Time: 01/12/18 12:36 PM  Result Value Ref Range   Glucose, UA NEGATIVE NEGATIVE mg/dL   Bilirubin Urine NEGATIVE NEGATIVE   Ketones, ur NEGATIVE NEGATIVE mg/dL   Specific Gravity, Urine 1.015 1.005 - 1.030   Hgb urine dipstick LARGE (A) NEGATIVE   pH 7.5 5.0 - 8.0   Protein, ur 30 (A) NEGATIVE mg/dL   Urobilinogen, UA 0.2 0.0 - 1.0 mg/dL   Nitrite POSITIVE (A) NEGATIVE  Leukocytes, UA LARGE (A) NEGATIVE    Assessment and Plan :    Acute cystitis with hematuria  Lower abdominal pain  CKD (chronic kidney disease), stage III (HCC)  We will cover patient for cystitis with Keflex given his CKD 3.  He is to follow-up with his urologist and let him know about his most recent UTI.  Urine culture is pending.   Jaynee Eagles, PA-C 01/12/18 1326

## 2018-01-13 ENCOUNTER — Encounter: Payer: Self-pay | Admitting: Family Medicine

## 2018-01-13 LAB — URINE CULTURE: Culture: NO GROWTH

## 2018-01-13 NOTE — Progress Notes (Signed)
Urine culture does not clearly demonstrate a UTI. Pt called and made aware. May need to consider alternate cause of urinary symptoms, such as dietary cause (caffeine); bowel issue (constipation); kidney stone passage; or prostate issue. Recheck or followup with a urologist for further evaluation if symptoms are not improving. Pt verbalized understanding.  

## 2018-01-17 ENCOUNTER — Telehealth: Payer: Self-pay | Admitting: Family Medicine

## 2018-01-17 NOTE — Telephone Encounter (Signed)
Pt came in to cancel appt with Dr. Tamala Julian on 01/19/18. He said he needed to come in to do a urine sample on 01/21/18 after he completes his antibiotics, and then once those lab results come back, he would like an OV. There were no OV openings until 02/04/18 at 8:20am so we went ahead and scheduled this appt. I was unsure if pt needed to be put in a sameday slot before this date, so I told pt I would put in a message and see if this was okay. He is still currently scheduled for 02/04/18. Thanks!

## 2018-01-19 ENCOUNTER — Ambulatory Visit: Payer: Medicare Other | Admitting: Family Medicine

## 2018-01-21 ENCOUNTER — Ambulatory Visit (INDEPENDENT_AMBULATORY_CARE_PROVIDER_SITE_OTHER): Payer: Medicare Other | Admitting: Family Medicine

## 2018-01-21 DIAGNOSIS — R3129 Other microscopic hematuria: Secondary | ICD-10-CM

## 2018-01-21 NOTE — Progress Notes (Signed)
Labs only visit

## 2018-01-22 LAB — URINALYSIS, ROUTINE W REFLEX MICROSCOPIC
Bilirubin, UA: NEGATIVE
Glucose, UA: NEGATIVE
Ketones, UA: NEGATIVE
LEUKOCYTES UA: NEGATIVE
Nitrite, UA: NEGATIVE
Protein, UA: NEGATIVE
RBC, UA: NEGATIVE
SPEC GRAV UA: 1.006 (ref 1.005–1.030)
Urobilinogen, Ur: 0.2 mg/dL (ref 0.2–1.0)
pH, UA: 6.5 (ref 5.0–7.5)

## 2018-01-22 LAB — URINE CULTURE: Organism ID, Bacteria: NO GROWTH

## 2018-01-23 ENCOUNTER — Encounter: Payer: Self-pay | Admitting: Family Medicine

## 2018-01-25 NOTE — Telephone Encounter (Signed)
Noted.  Recent Urgent Care note reviewed with labs; repeat u/a with urine cx reviewed.  Lab result message sent to pt.  Upcoming appointment on 02/04/18 appropriate.

## 2018-02-04 ENCOUNTER — Ambulatory Visit: Payer: Medicare Other | Admitting: Family Medicine

## 2018-02-10 ENCOUNTER — Encounter: Payer: Self-pay | Admitting: Family Medicine

## 2018-02-11 ENCOUNTER — Other Ambulatory Visit: Payer: Self-pay

## 2018-02-11 ENCOUNTER — Encounter: Payer: Self-pay | Admitting: Family Medicine

## 2018-02-11 ENCOUNTER — Ambulatory Visit (INDEPENDENT_AMBULATORY_CARE_PROVIDER_SITE_OTHER): Payer: Medicare Other | Admitting: Family Medicine

## 2018-02-11 VITALS — BP 112/78 | HR 73 | Temp 98.0°F | Resp 16 | Ht 71.65 in | Wt 237.2 lb

## 2018-02-11 DIAGNOSIS — E7800 Pure hypercholesterolemia, unspecified: Secondary | ICD-10-CM

## 2018-02-11 DIAGNOSIS — T56891A Toxic effect of other metals, accidental (unintentional), initial encounter: Secondary | ICD-10-CM | POA: Diagnosis not present

## 2018-02-11 DIAGNOSIS — N401 Enlarged prostate with lower urinary tract symptoms: Secondary | ICD-10-CM | POA: Diagnosis not present

## 2018-02-11 DIAGNOSIS — N138 Other obstructive and reflux uropathy: Secondary | ICD-10-CM | POA: Diagnosis not present

## 2018-02-11 DIAGNOSIS — M5432 Sciatica, left side: Secondary | ICD-10-CM | POA: Diagnosis not present

## 2018-02-11 DIAGNOSIS — N141 Nephropathy induced by other drugs, medicaments and biological substances: Secondary | ICD-10-CM

## 2018-02-11 DIAGNOSIS — F3178 Bipolar disorder, in full remission, most recent episode mixed: Secondary | ICD-10-CM

## 2018-02-11 DIAGNOSIS — G43109 Migraine with aura, not intractable, without status migrainosus: Secondary | ICD-10-CM

## 2018-02-11 DIAGNOSIS — N182 Chronic kidney disease, stage 2 (mild): Secondary | ICD-10-CM | POA: Diagnosis not present

## 2018-02-11 MED ORDER — LAMOTRIGINE 150 MG PO TABS: 150.0000 mg | ORAL_TABLET | Freq: Every day | ORAL | 1 refills | Status: DC

## 2018-02-11 NOTE — Progress Notes (Signed)
Subjective:    Patient ID: Christian Parsons, male    DOB: Feb 01, 1946, 72 y.o.   MRN: [id number]  02/11/2018  Chronic Kidney Disease (3 month follow-up )    HPI This 72 y.o. male presents for three month follow-up:  LEFT lower extremity pain: much, much better.  No longer taking Gabapentin.  Went on hike the other week; lagged behind a bit but felt better the next day. Hiking more. Golden Tea (Tumeric, warm milk, ginger, cinnamon, raisins, honey) two cups per day. NIH has whole list of articles on Tumeric.   L leg is weaker than the R; working on strengthening. No further professional attention at this time.  Penile discharge: pus on end of penis.  Seen at [facility]; evaluated by [facility].  Urine dip grossly abnormal.  Urine culture negative.  Repeat u/a on 01/21/18 clear; urine cx negative.  Has a weak kidney, unusual bladder.  Has follow-up with urology in August 2019.  Worried about bladder cancer.  No previous cystoscopy.  Uses bladder scan.  Often does not empty bladder fully.  First prostate infection in years.    No persistent penile discharge.  No urgency, frequency, dysuria, hematuria.    Bipolar disorder: Patient reports good compliance with medication, good tolerance to medication, and good symptom control.   Does not miss psychiatry office at all.  Resistant to overprescribing, over-interpret.  No current therapist; has been released.  Decades of therapy in the past.    Wants to focus on hiking with increased endurance and intensity.   1 teaspoon of Tumeric per day.  NIH database.  Nephrology journals.     BP Readings from Last 3 Encounters:  02/11/18 112/78  01/12/18 (!) 171/96  01/07/18 130/84   Wt Readings from Last 3 Encounters:  02/11/18 237 lb 3.2 oz (107.6 kg)  01/07/18 242 lb 12.8 oz (110.1 kg)  11/12/17 238 lb (108 kg)   Immunization History  Administered Date(s) Administered  . Influenza Split 07/24/2012  . Influenza,inj,Quad PF,6+ Mos 06/16/2013,  05/25/2014, 06/18/2015, 07/30/2016, 06/03/2017  . Pneumococcal Conjugate-13 07/30/2016  . Pneumococcal Polysaccharide-23 11/07/2010, 08/05/2017  . Td 01/15/2004  . Tdap 06/16/2013    Review of Systems  Constitutional: Negative for activity change, appetite change, chills, diaphoresis, fatigue, fever and unexpected weight change.  HENT: Negative for congestion, dental problem, drooling, ear discharge, ear pain, facial swelling, hearing loss, mouth sores, nosebleeds, postnasal drip, rhinorrhea, sinus pressure, sneezing, sore throat, tinnitus, trouble swallowing and voice change.   Eyes: Negative for photophobia, pain, discharge, redness, itching and visual disturbance.  Respiratory: Negative for apnea, cough, choking, chest tightness, shortness of breath, wheezing and stridor.   Cardiovascular: Negative for chest pain, palpitations and leg swelling.  Gastrointestinal: Negative for abdominal distention, abdominal pain, anal bleeding, blood in stool, constipation, diarrhea, nausea and vomiting.  Endocrine: Negative for cold intolerance, heat intolerance, polydipsia, polyphagia and polyuria.  Genitourinary: Negative for decreased urine volume, difficulty urinating, discharge, dysuria, enuresis, flank pain, frequency, genital sores, hematuria, penile pain, penile swelling, scrotal swelling, testicular pain and urgency.  Musculoskeletal: Positive for arthralgias and back pain. Negative for gait problem, joint swelling, myalgias, neck pain and neck stiffness.  Skin: Negative for color change, pallor, rash and wound.  Allergic/Immunologic: Negative for environmental allergies, food allergies and immunocompromised state.  Neurological: Negative for dizziness, tremors, seizures, syncope, facial asymmetry, speech difficulty, weakness, light-headedness, numbness and headaches.  Hematological: Negative for adenopathy. Does not bruise/bleed easily.  Psychiatric/Behavioral: Negative for agitation, behavioral  problems, confusion, decreased concentration, dysphoric mood, hallucinations, self-injury, sleep disturbance and suicidal ideas. The patient is not nervous/anxious and is not hyperactive.     Past Medical History:  Diagnosis Date  . Anxiety   . Bipolar disorder ([place])    H/O  . BPH with urinary obstruction    s/p TURP  . Chronic kidney disease   . Chronic renal insufficiency, stage 2 (mild)    secondary to lithium toxicity; baseline creatinine 1.3-1.6  . Coronary artery disease   . Depression   . Hypertension    Past Surgical History:  Procedure Laterality Date  . HERNIA REPAIR    . PROSTATE SURGERY    . TRANSURETHRAL RESECTION OF PROSTATE     No Known Allergies Current Outpatient Medications on File Prior to Visit  Medication Sig Dispense Refill  . lamoTRIgine (LAMICTAL) 25 MG tablet Take 1-2 tablets (25-50 mg total) by mouth daily. 120 tablet 1  . rizatriptan (MAXALT) 10 MG tablet TAKE 1 TABLET (10 MG TOTAL) BY MOUTH AS NEEDED. MAY REPEAT IN 2 HRS IF NEEDED. 10 tablet 3  . tobramycin-dexamethasone (TOBRADEX) ophthalmic solution INSTILL ONE DROP BY OPHTHALMIC ROUTE FOUR TIMES EVERY DAY  0   No current facility-administered medications on file prior to visit.    Social History   Socioeconomic History  . Marital status: Married    Spouse name: Not on file  . Number of children: 1  . Years of education: 16+  . Highest education level: Professional school degree (e.g., MD, DDS, DVM, JD)  Occupational History  . Occupation: retired    Comment: Music therapist  Social Needs  . Financial resource strain: Not hard at all  . Food insecurity:    Worry: Never true    Inability: Never true  . Transportation needs:    Medical: No    Non-medical: No  Tobacco Use  . Smoking status: Never Smoker  . Smokeless tobacco: Never Used  Substance and Sexual Activity  . Alcohol use: Yes    Alcohol/week: 1.2 oz    Types: 2 Standard drinks or equivalent per week    Comment:  Occasional  . Drug use: No  . Sexual activity: Yes    Partners: Female    Birth control/protection: Post-menopausal  Lifestyle  . Physical activity:    Days per week: 0 days    Minutes per session: 0 min  . Stress: To some extent  Relationships  . Social connections:    Talks on phone: More than three times a week    Gets together: More than three times a week    Attends religious service: More than 4 times per year    Active member of club or organization: Yes    Attends meetings of clubs or organizations: More than 4 times per year    Relationship status: Married  . Intimate partner violence:    Fear of current or ex partner: No    Emotionally abused: No    Physically abused: No    Forced sexual activity: No  Other Topics Concern  . Not on file  Social History Narrative   Marital status: married x 48 years      Children: 1 daughter (14yo Professor Cashis Rill), no grandchildren.       Lives: with wife/[name]      Employment: retired at age 50 years of life; [place] x 12 years      Tobacco: never      Alcohol: rare in  2018; special occasions only      Exercise: walks 4-5 days per week also hikes 4 days per week 1-7 hours per day.  Swimming at [employer].       Advanced Directives; YES; full code but no prolonged measures.      ADLs: independent with ADLs in 2018; drives.   Family History  Problem Relation Age of Onset  . Cancer Mother        breast cancer  . Dementia Father   . Arthritis Sister        hip OA s/p hip replacement       Objective:    BP 112/78   Pulse 73   Temp 98 F (36.7 C) (Oral)   Resp 16   Ht 5' 11.65" (1.82 m)   Wt 237 lb 3.2 oz (107.6 kg)   SpO2 98%   BMI 32.48 kg/m  Physical Exam  Constitutional: He is oriented to person, place, and time. He appears well-developed and well-nourished. No distress.  HENT:  Head: Normocephalic and atraumatic.  Right Ear: External ear normal.  Left Ear: External ear normal.  Nose:  Nose normal.  Mouth/Throat: Oropharynx is clear and moist.  Eyes: Pupils are equal, round, and reactive to light. Conjunctivae and EOM are normal.  Neck: Normal range of motion. Neck supple. Carotid bruit is not present. No thyromegaly present.  Cardiovascular: Normal rate, regular rhythm, normal heart sounds and intact distal pulses. Exam reveals no gallop and no friction rub.  No murmur heard. Pulmonary/Chest: Effort normal and breath sounds normal. He has no wheezes. He has no rales.  Abdominal: Soft. Bowel sounds are normal. He exhibits no distension and no mass. There is no tenderness. There is no rebound and no guarding.  Musculoskeletal:       Right shoulder: Normal.       Left shoulder: Normal.       Left knee: Normal. He exhibits normal range of motion and no swelling. No tenderness found. No medial joint line and no lateral joint line tenderness noted.       Left ankle: Normal. He exhibits normal range of motion. No tenderness. No lateral malleolus and no medial malleolus tenderness found.       Cervical back: Normal.       Lumbar back: He exhibits normal range of motion, no tenderness, no bony tenderness, no swelling, no edema, no pain and no spasm.  Lymphadenopathy:    He has no cervical adenopathy.  Neurological: He is alert and oriented to person, place, and time. He has normal reflexes. No cranial nerve deficit. He exhibits normal muscle tone. Coordination normal.  Skin: Skin is warm and dry. No rash noted. He is not diaphoretic.  Psychiatric: He has a normal mood and affect. His behavior is normal. Judgment and thought content normal.   No results found. Depression screen [facility] 2/9 02/11/2018 01/07/2018 11/12/2017 08/11/2017 08/05/2017  Decreased Interest 0 0 0 0 0  Down, Depressed, Hopeless 0 0 0 0 1  PHQ - 2 Score 0 0 0 0 1   Fall Risk  02/11/2018 01/07/2018 11/12/2017 08/11/2017 08/05/2017  Falls in the past year? No No No No No  Risk for fall due to : - - - - -          Assessment & Plan:   1. CKD (chronic kidney disease) stage 2 or early 3   2. Pure hypercholesterolemia   3. Left sided sciatica   4. BPH with urinary obstruction  5. Lithium nephropathy   6. Migraine with aura and without status migrainosus, not intractable   7. Bipolar disorder, in full remission, most recent episode mixed ([place])     Chronic kidney disease:  Stable at this time; followed closely by urology and nephrology; avoid nephrotoxic medications.   Hypercholesterolemia: obtain labs;  I recommend weight loss, exercise, and low-cholesterol low-fat food choices.  I recommend limiting red meat to once per week; I recommend limiting fried foods to once per month.  L sciatica: much improved; doing very well and tolerating increased exercise program.  Has successfully weaned Gabapentin therapy without increase in pain.  Bipolar disorder: stable at this time.  Refill provided.      Orders Placed This Encounter  Procedures  . Comprehensive metabolic panel    Order Specific Question:   Has the patient fasted?    Answer:   No  . Lipid panel    Order Specific Question:   Has the patient fasted?    Answer:   No   Meds ordered this encounter  Medications  . lamoTRIgine (LAMICTAL) 150 MG tablet    Sig: Take 1 tablet (150 mg total) by mouth daily.    Dispense:  90 tablet    Refill:  1    Return in about 3 months (around 05/14/2018) for follow-up chronic medical conditions.   Lakitha Gordy Elayne Guerin, M.D. Primary Care at [facility] [address] [place], [place]  [phone] phone [phone] fax

## 2018-02-11 NOTE — Patient Instructions (Signed)
     IF you received an x-ray today, you will receive an invoice from [facility]. Please contact [facility] at [phone] with questions or concerns regarding your invoice.   IF you received labwork today, you will receive an invoice from [facility]. Please contact [facility] at [phone] with questions or concerns regarding your invoice.   Our billing staff will not be able to assist you with questions regarding bills from these companies.  You will be contacted with the lab results as soon as they are available. The fastest way to get your results is to activate your My Chart account. Instructions are located on the last page of this paperwork. If you have not heard from us regarding the results in 2 weeks, please contact this office.     

## 2018-02-12 LAB — COMPREHENSIVE METABOLIC PANEL
A/G RATIO: 1.8 (ref 1.2–2.2)
ALT: 17 IU/L (ref 0–44)
AST: 19 IU/L (ref 0–40)
Albumin: 4.5 g/dL (ref 3.6–4.8)
Alkaline Phosphatase: 81 IU/L (ref 39–117)
BILIRUBIN TOTAL: 0.6 mg/dL (ref 0.0–1.2)
BUN/Creatinine Ratio: 14 (ref 10–24)
BUN: 23 mg/dL (ref 8–27)
CHLORIDE: 107 mmol/L — AB (ref 96–106)
CO2: 22 mmol/L (ref 20–29)
Calcium: 9.8 mg/dL (ref 8.6–10.2)
Creatinine, Ser: 1.65 mg/dL — ABNORMAL HIGH (ref 0.76–1.27)
GFR calc Af Amer: 48 mL/min/{1.73_m2} — ABNORMAL LOW (ref >59)
GFR, EST NON AFRICAN AMERICAN: 42 mL/min/{1.73_m2} — AB (ref >59)
GLOBULIN, TOTAL: 2.5 g/dL (ref 1.5–4.5)
Glucose: 92 mg/dL (ref 65–99)
POTASSIUM: 5.3 mmol/L — AB (ref 3.5–5.2)
SODIUM: 144 mmol/L (ref 134–144)
Total Protein: 7 g/dL (ref 6.0–8.5)

## 2018-02-12 LAB — LIPID PANEL
CHOL/HDL RATIO: 4.3 ratio (ref 0.0–5.0)
Cholesterol, Total: 139 mg/dL (ref 100–199)
HDL: 32 mg/dL — AB (ref >39)
LDL Calculated: 79 mg/dL (ref 0–99)
Triglycerides: 142 mg/dL (ref 0–149)
VLDL Cholesterol Cal: 28 mg/dL (ref 5–40)

## 2018-04-29 DIAGNOSIS — R339 Retention of urine, unspecified: Secondary | ICD-10-CM | POA: Diagnosis not present

## 2018-05-04 DIAGNOSIS — N281 Cyst of kidney, acquired: Secondary | ICD-10-CM | POA: Diagnosis not present

## 2018-05-04 DIAGNOSIS — N182 Chronic kidney disease, stage 2 (mild): Secondary | ICD-10-CM | POA: Diagnosis not present

## 2018-05-19 DIAGNOSIS — N141 Nephropathy induced by other drugs, medicaments and biological substances: Secondary | ICD-10-CM | POA: Diagnosis not present

## 2018-05-19 DIAGNOSIS — N183 Chronic kidney disease, stage 3 (moderate): Secondary | ICD-10-CM | POA: Diagnosis not present

## 2018-05-19 DIAGNOSIS — I700 Atherosclerosis of aorta: Secondary | ICD-10-CM | POA: Diagnosis not present

## 2018-05-19 DIAGNOSIS — T56891A Toxic effect of other metals, accidental (unintentional), initial encounter: Secondary | ICD-10-CM | POA: Diagnosis not present

## 2018-05-19 DIAGNOSIS — G43109 Migraine with aura, not intractable, without status migrainosus: Secondary | ICD-10-CM | POA: Diagnosis not present

## 2018-05-19 DIAGNOSIS — R339 Retention of urine, unspecified: Secondary | ICD-10-CM | POA: Diagnosis not present

## 2018-05-19 DIAGNOSIS — M5432 Sciatica, left side: Secondary | ICD-10-CM | POA: Diagnosis not present

## 2018-05-19 DIAGNOSIS — N138 Other obstructive and reflux uropathy: Secondary | ICD-10-CM | POA: Diagnosis not present

## 2018-05-19 DIAGNOSIS — N401 Enlarged prostate with lower urinary tract symptoms: Secondary | ICD-10-CM | POA: Diagnosis not present

## 2018-05-19 DIAGNOSIS — F3181 Bipolar II disorder: Secondary | ICD-10-CM | POA: Diagnosis not present

## 2018-05-20 ENCOUNTER — Encounter: Payer: Self-pay | Admitting: Family Medicine

## 2018-05-20 DIAGNOSIS — Z23 Encounter for immunization: Secondary | ICD-10-CM | POA: Diagnosis not present

## 2018-05-27 DIAGNOSIS — R338 Other retention of urine: Secondary | ICD-10-CM | POA: Diagnosis not present

## 2018-05-27 DIAGNOSIS — N401 Enlarged prostate with lower urinary tract symptoms: Secondary | ICD-10-CM | POA: Diagnosis not present

## 2018-05-27 DIAGNOSIS — N182 Chronic kidney disease, stage 2 (mild): Secondary | ICD-10-CM | POA: Diagnosis not present

## 2018-05-27 DIAGNOSIS — N281 Cyst of kidney, acquired: Secondary | ICD-10-CM | POA: Diagnosis not present

## 2018-06-01 DIAGNOSIS — H2513 Age-related nuclear cataract, bilateral: Secondary | ICD-10-CM | POA: Diagnosis not present

## 2018-06-09 DIAGNOSIS — R338 Other retention of urine: Secondary | ICD-10-CM | POA: Diagnosis not present

## 2018-06-09 DIAGNOSIS — N401 Enlarged prostate with lower urinary tract symptoms: Secondary | ICD-10-CM | POA: Diagnosis not present

## 2018-06-09 DIAGNOSIS — T43595S Adverse effect of other antipsychotics and neuroleptics, sequela: Secondary | ICD-10-CM | POA: Diagnosis not present

## 2018-06-09 DIAGNOSIS — N183 Chronic kidney disease, stage 3 (moderate): Secondary | ICD-10-CM | POA: Diagnosis not present

## 2018-06-09 DIAGNOSIS — I129 Hypertensive chronic kidney disease with stage 1 through stage 4 chronic kidney disease, or unspecified chronic kidney disease: Secondary | ICD-10-CM | POA: Diagnosis not present

## 2018-06-09 DIAGNOSIS — N141 Nephropathy induced by other drugs, medicaments and biological substances: Secondary | ICD-10-CM | POA: Diagnosis not present

## 2018-07-07 DIAGNOSIS — N281 Cyst of kidney, acquired: Secondary | ICD-10-CM | POA: Diagnosis not present

## 2018-07-22 DIAGNOSIS — N281 Cyst of kidney, acquired: Secondary | ICD-10-CM | POA: Diagnosis not present

## 2018-07-22 DIAGNOSIS — R339 Retention of urine, unspecified: Secondary | ICD-10-CM | POA: Diagnosis not present

## 2018-08-04 DIAGNOSIS — N401 Enlarged prostate with lower urinary tract symptoms: Secondary | ICD-10-CM | POA: Diagnosis not present

## 2018-08-04 DIAGNOSIS — N183 Chronic kidney disease, stage 3 (moderate): Secondary | ICD-10-CM | POA: Diagnosis not present

## 2018-08-04 DIAGNOSIS — N138 Other obstructive and reflux uropathy: Secondary | ICD-10-CM | POA: Diagnosis not present

## 2018-08-07 DIAGNOSIS — N398 Other specified disorders of urinary system: Secondary | ICD-10-CM | POA: Diagnosis not present

## 2018-08-07 DIAGNOSIS — N281 Cyst of kidney, acquired: Secondary | ICD-10-CM | POA: Diagnosis not present

## 2018-08-12 DIAGNOSIS — N398 Other specified disorders of urinary system: Secondary | ICD-10-CM | POA: Diagnosis not present

## 2018-08-12 DIAGNOSIS — R29898 Other symptoms and signs involving the musculoskeletal system: Secondary | ICD-10-CM | POA: Diagnosis not present

## 2018-08-12 DIAGNOSIS — N281 Cyst of kidney, acquired: Secondary | ICD-10-CM | POA: Diagnosis not present

## 2018-08-18 DIAGNOSIS — N138 Other obstructive and reflux uropathy: Secondary | ICD-10-CM | POA: Diagnosis not present

## 2018-08-18 DIAGNOSIS — G43109 Migraine with aura, not intractable, without status migrainosus: Secondary | ICD-10-CM | POA: Diagnosis not present

## 2018-08-18 DIAGNOSIS — L2489 Irritant contact dermatitis due to other agents: Secondary | ICD-10-CM | POA: Diagnosis not present

## 2018-08-18 DIAGNOSIS — N141 Nephropathy induced by other drugs, medicaments and biological substances: Secondary | ICD-10-CM | POA: Diagnosis not present

## 2018-08-18 DIAGNOSIS — F3181 Bipolar II disorder: Secondary | ICD-10-CM | POA: Diagnosis not present

## 2018-08-18 DIAGNOSIS — Z0000 Encounter for general adult medical examination without abnormal findings: Secondary | ICD-10-CM | POA: Diagnosis not present

## 2018-08-18 DIAGNOSIS — E7800 Pure hypercholesterolemia, unspecified: Secondary | ICD-10-CM | POA: Diagnosis not present

## 2018-08-18 DIAGNOSIS — R339 Retention of urine, unspecified: Secondary | ICD-10-CM | POA: Diagnosis not present

## 2018-08-18 DIAGNOSIS — N401 Enlarged prostate with lower urinary tract symptoms: Secondary | ICD-10-CM | POA: Diagnosis not present

## 2018-08-18 DIAGNOSIS — T56891A Toxic effect of other metals, accidental (unintentional), initial encounter: Secondary | ICD-10-CM | POA: Diagnosis not present

## 2018-08-18 DIAGNOSIS — N183 Chronic kidney disease, stage 3 (moderate): Secondary | ICD-10-CM | POA: Diagnosis not present

## 2018-08-19 DIAGNOSIS — R29898 Other symptoms and signs involving the musculoskeletal system: Secondary | ICD-10-CM | POA: Diagnosis not present

## 2018-08-19 DIAGNOSIS — N281 Cyst of kidney, acquired: Secondary | ICD-10-CM | POA: Diagnosis not present

## 2018-08-19 DIAGNOSIS — N398 Other specified disorders of urinary system: Secondary | ICD-10-CM | POA: Diagnosis not present

## 2018-08-24 DIAGNOSIS — N398 Other specified disorders of urinary system: Secondary | ICD-10-CM | POA: Diagnosis not present

## 2018-08-24 DIAGNOSIS — R29898 Other symptoms and signs involving the musculoskeletal system: Secondary | ICD-10-CM | POA: Diagnosis not present

## 2018-08-24 DIAGNOSIS — N281 Cyst of kidney, acquired: Secondary | ICD-10-CM | POA: Diagnosis not present

## 2018-08-27 DIAGNOSIS — N281 Cyst of kidney, acquired: Secondary | ICD-10-CM | POA: Diagnosis not present

## 2018-08-27 DIAGNOSIS — R29898 Other symptoms and signs involving the musculoskeletal system: Secondary | ICD-10-CM | POA: Diagnosis not present

## 2018-08-27 DIAGNOSIS — N398 Other specified disorders of urinary system: Secondary | ICD-10-CM | POA: Diagnosis not present

## 2018-09-07 DIAGNOSIS — N398 Other specified disorders of urinary system: Secondary | ICD-10-CM | POA: Diagnosis not present

## 2018-09-07 DIAGNOSIS — R29898 Other symptoms and signs involving the musculoskeletal system: Secondary | ICD-10-CM | POA: Diagnosis not present

## 2018-09-07 DIAGNOSIS — N281 Cyst of kidney, acquired: Secondary | ICD-10-CM | POA: Diagnosis not present

## 2018-09-14 DIAGNOSIS — N281 Cyst of kidney, acquired: Secondary | ICD-10-CM | POA: Diagnosis not present

## 2018-09-14 DIAGNOSIS — N398 Other specified disorders of urinary system: Secondary | ICD-10-CM | POA: Diagnosis not present

## 2018-09-14 DIAGNOSIS — R29898 Other symptoms and signs involving the musculoskeletal system: Secondary | ICD-10-CM | POA: Diagnosis not present

## 2018-10-01 IMAGING — DX DG KNEE COMPLETE 4+V*L*
4 series · 4 of 4 positions shown · non-contrast
Comparison: None.

CLINICAL DATA: Chronic left knee pain for 1 year, no known injury,
initial encounter

EXAM:
LEFT KNEE - COMPLETE 4+ VIEW

[dg knee complete 4 views left (1 of 4)]
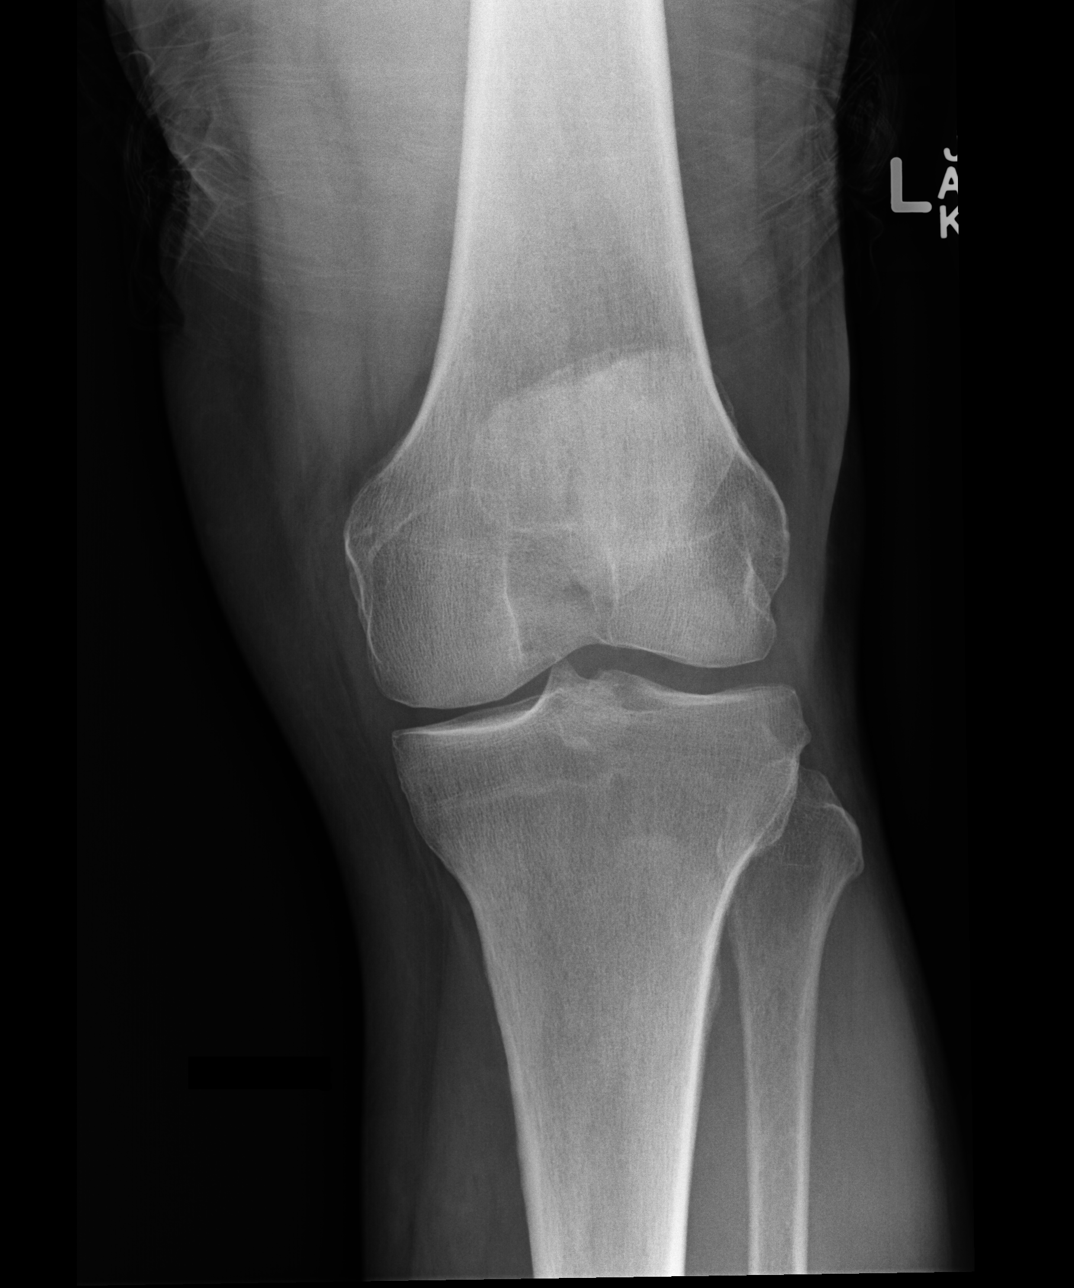

[dg knee complete 4 views left (2 of 4)]
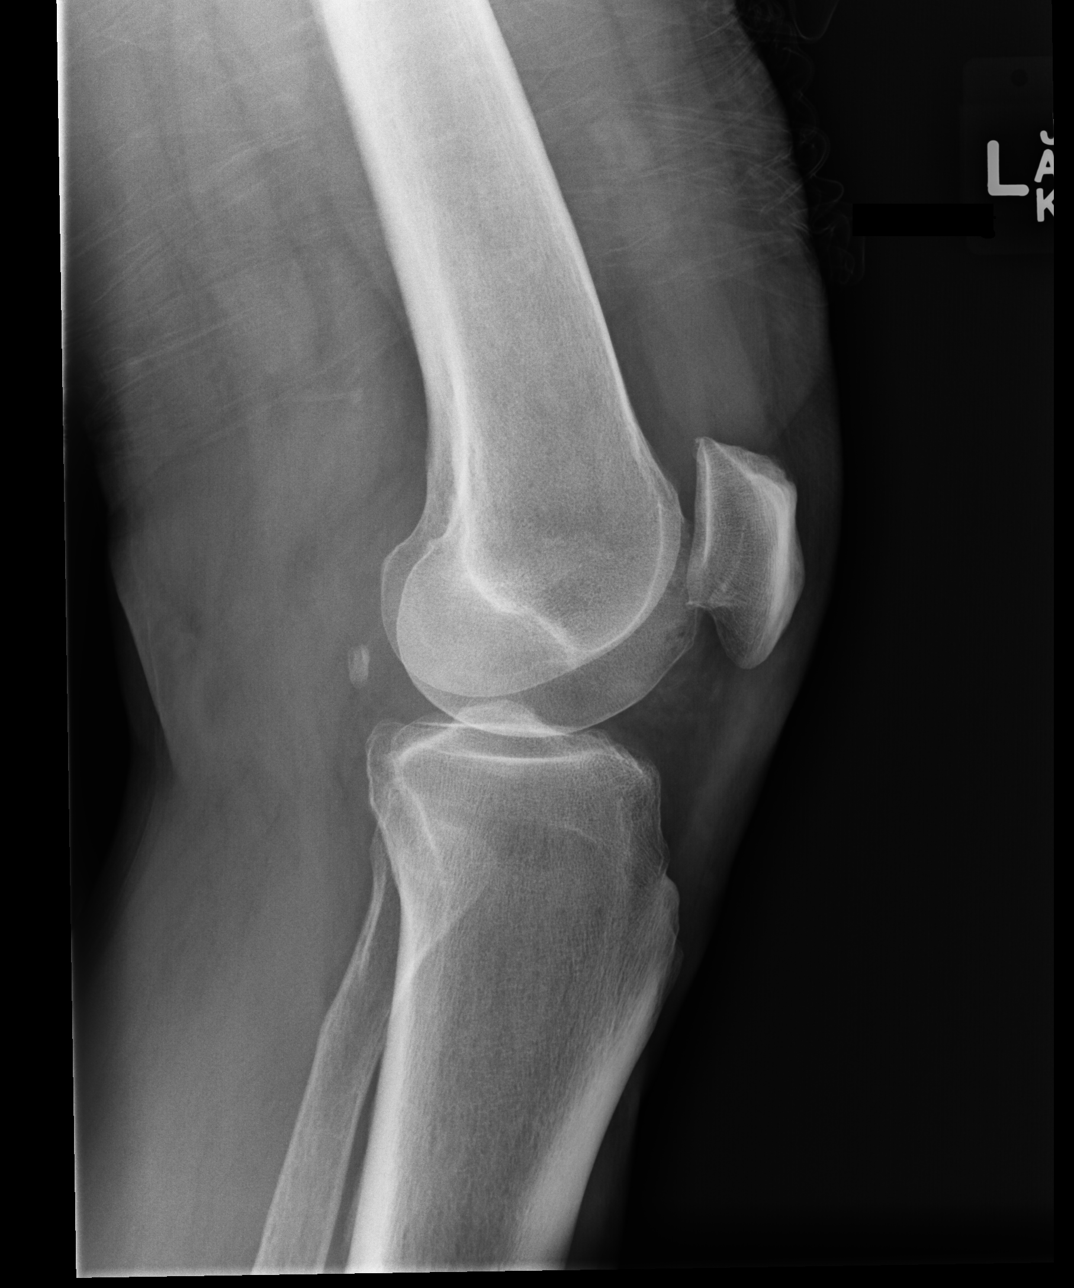

[dg knee complete 4 views left (3 of 4)]
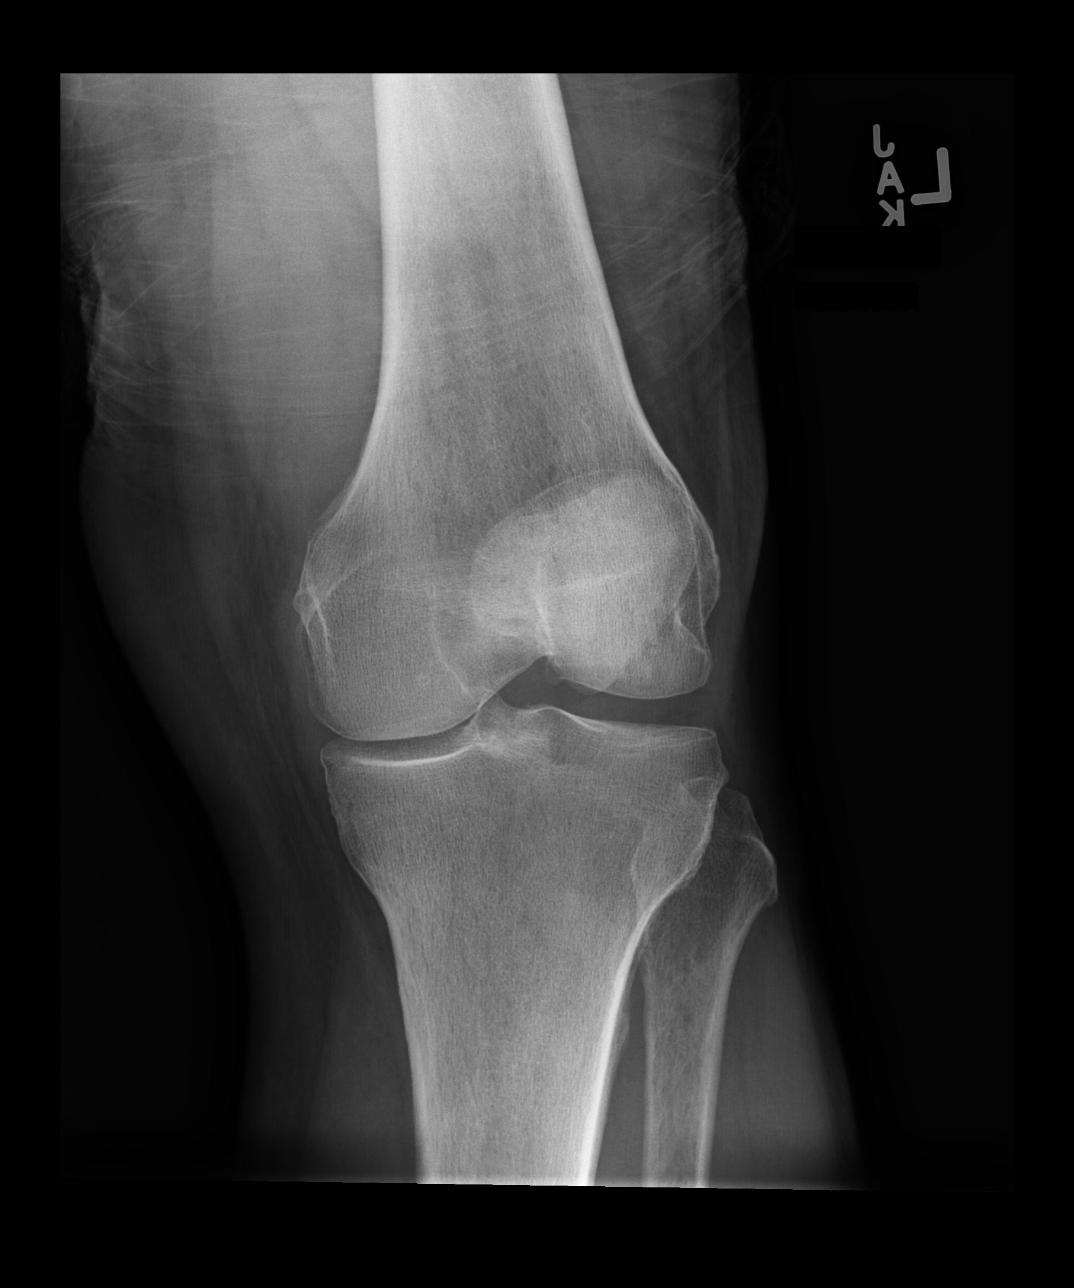

[dg knee complete 4 views left (4 of 4)]
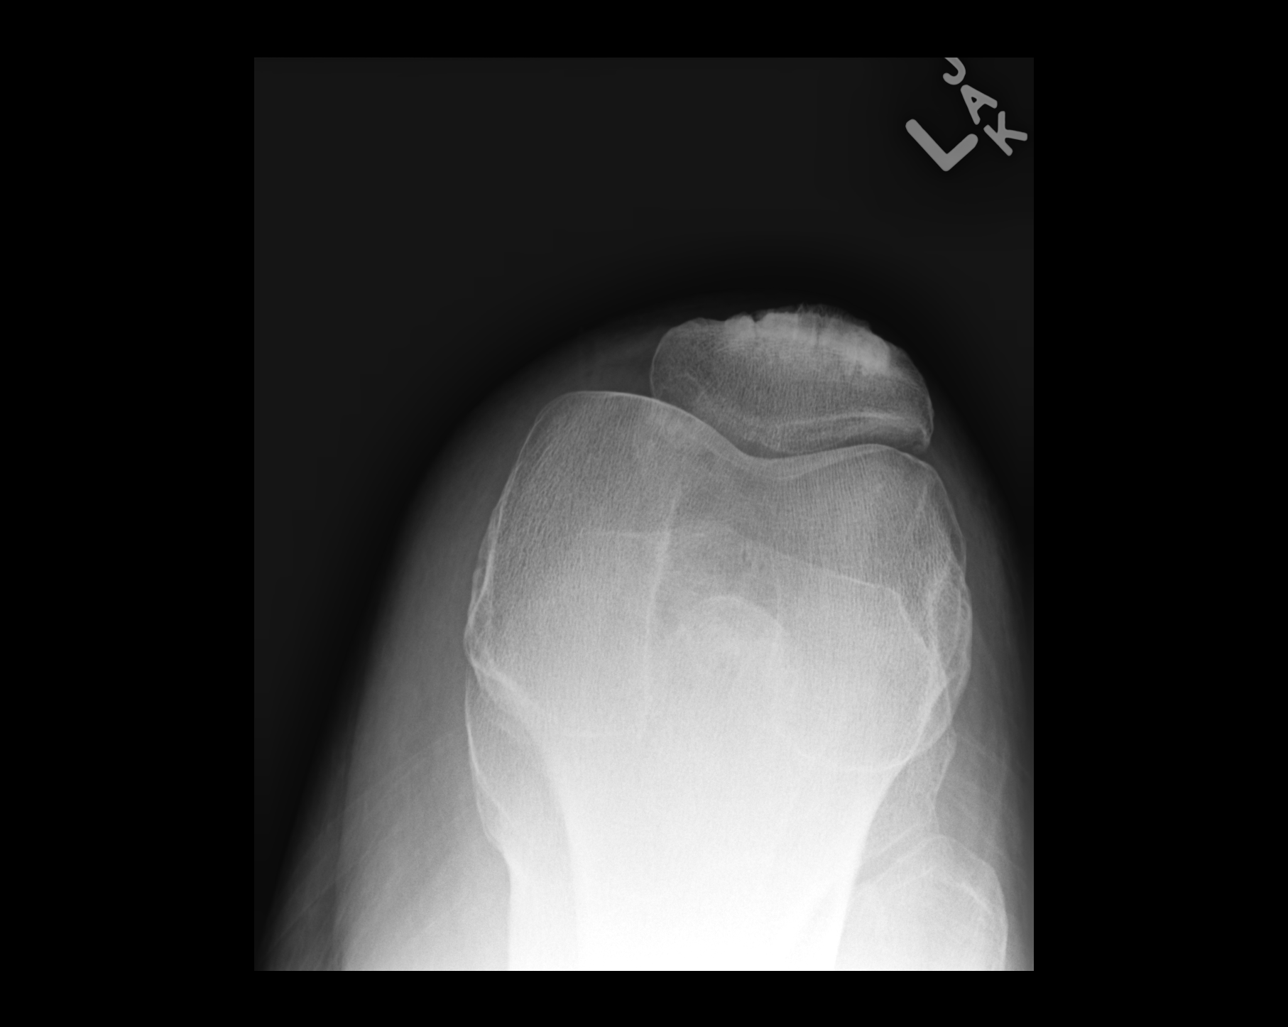

[4 of 4 positions shown; findings below may reference images not displayed]

FINDINGS: Mild medial joint space narrowing is noted. No acute fracture or
dislocation is noted. Patellofemoral degenerative changes are seen
as well. No significant joint effusion is noted.
IMPRESSION: Degenerative change without acute abnormality.

## 2018-10-16 ENCOUNTER — Other Ambulatory Visit: Payer: Self-pay | Admitting: Family Medicine

## 2018-10-16 ENCOUNTER — Other Ambulatory Visit (HOSPITAL_COMMUNITY): Payer: Self-pay | Admitting: Family Medicine

## 2018-10-16 DIAGNOSIS — R310 Gross hematuria: Secondary | ICD-10-CM

## 2018-10-20 ENCOUNTER — Ambulatory Visit
Admission: RE | Admit: 2018-10-20 | Discharge: 2018-10-20 | Disposition: A | Discharge status: Home / Self Care | Payer: Medicare Other | Source: Ambulatory Visit | Attending: Family Medicine | Admitting: Family Medicine

## 2018-10-20 DIAGNOSIS — R310 Gross hematuria: Secondary | ICD-10-CM | POA: Insufficient documentation

## 2019-01-07 ENCOUNTER — Encounter: Payer: Self-pay | Admitting: Family Medicine

## 2019-04-26 ENCOUNTER — Telehealth (INDEPENDENT_AMBULATORY_CARE_PROVIDER_SITE_OTHER): Payer: Medicare Other | Admitting: Family Medicine

## 2019-04-26 DIAGNOSIS — R42 Dizziness and giddiness: Secondary | ICD-10-CM | POA: Diagnosis not present

## 2019-04-26 DIAGNOSIS — I700 Atherosclerosis of aorta: Secondary | ICD-10-CM | POA: Diagnosis not present

## 2019-04-26 DIAGNOSIS — Y9301 Activity, walking, marching and hiking: Secondary | ICD-10-CM

## 2019-04-26 DIAGNOSIS — Z01818 Encounter for other preprocedural examination: Secondary | ICD-10-CM

## 2019-04-27 ENCOUNTER — Ambulatory Visit: Payer: Medicare Other | Admitting: Family Medicine

## 2019-04-27 ENCOUNTER — Encounter: Payer: Self-pay | Admitting: Family Medicine

## 2019-04-27 NOTE — Progress Notes (Signed)
Telemedicine Encounter- SOAP NOTE Established Patient  This telephone encounter was conducted with the patient's (or proxy's) verbal consent via audio telecommunications: yes/no: Yes Patient was instructed to have this encounter in a suitably private space; and to only have persons present to whom they give permission to participate. In addition, patient identity was confirmed by use of name plus two identifiers (DOB and address).  I discussed the limitations, risks, security and privacy concerns of performing an evaluation and management service by telephone and the availability of in person appointments. I also discussed with the patient that there may be a patient responsible charge related to this service. The patient expressed understanding and agreed to proceed.  I spent a total of TIME; 0 MIN TO 60 MIN: 25 minutes talking with the patient or their proxy.  CC: hiking fatigue  Subjective   Christian Parsons is a 73 y.o. established patient. Telephone visit today for  HPI  Patient receives his Primary Care with [facility] and reports that he lives in [place] and that [place] is too far. 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  He reports that he maintains hydration and eats a healthy diet.  He is 40 and has been hiking for years.  He started having fatigue and dizziness.  He went to a gentle easier slope and maintains his routine of 7-8 days but he is fearful of having a heart event while hiking.  States that he is a nonsmoker since age 66 No alcohol use No illicits  He has a family history of heart disease He denies a history of chest pain  Denies personal history of heart attack   Patient Active Problem List   Diagnosis Date Noted  . Atherosclerosis of aorta ([place]) 11/18/2017  . Left sided sciatica 11/18/2017  . Pes planus 03/13/2017  . Knee pain 03/11/2017  . BPH with urinary obstruction 08/12/2016  . CKD (chronic kidney disease) stage 2 or early 3  07/11/2015  . Lithium nephropathy 03/16/2015  . Ventral hernia without obstruction or gangrene 02/17/2015  . Diastasis recti 12/22/2014  . Erectile dysfunction 12/22/2014  . Benign essential tremor 06/16/2013  . Migraine headache with aura 12/25/2011  . BMI 31.0-31.9,adult 12/25/2011  . Right bundle branch block 12/25/2011  . Mood disorder [facility])     Past Medical History:  Diagnosis Date  . Anxiety   . Bipolar disorder ([place])    H/O  . BPH with urinary obstruction    s/p TURP  . Chronic kidney disease   . Chronic renal insufficiency, stage 2 (mild)    secondary to lithium toxicity; baseline creatinine 1.3-1.6  . Coronary artery disease   . Depression   . Hypertension     Current Outpatient Medications  Medication Sig Dispense Refill  . lamoTRIgine (LAMICTAL) 150 MG tablet Take 1 tablet (150 mg total) by mouth daily. 90 tablet 1  . lamoTRIgine (LAMICTAL) 25 MG tablet Take 1-2 tablets (25-50 mg total) by mouth daily. 120 tablet 1  . rizatriptan (MAXALT) 10 MG tablet TAKE 1 TABLET (10 MG TOTAL) BY MOUTH AS NEEDED. MAY REPEAT IN 2 HRS IF NEEDED. 10 tablet 3  . tobramycin-dexamethasone (TOBRADEX) ophthalmic solution INSTILL ONE DROP BY OPHTHALMIC ROUTE FOUR TIMES EVERY DAY  0   No current facility-administered medications for this visit.     No Known Allergies  Social History   Socioeconomic History  . Marital status: Married    Spouse name: Not on file  .  Number of children: 1  . Years of education: 16+  . Highest education level: Professional school degree (e.g., MD, DDS, DVM, JD)  Occupational History  . Occupation: retired    Comment: Music therapist  Social Needs  . Financial resource strain: Not hard at all  . Food insecurity    Worry: Never true    Inability: Never true  . Transportation needs    Medical: No    Non-medical: No  Tobacco Use  . Smoking status: Never Smoker  . Smokeless tobacco: Never Used  Substance and Sexual Activity  . Alcohol use: Yes     Alcohol/week: 2.0 standard drinks    Types: 2 Standard drinks or equivalent per week    Comment: Occasional  . Drug use: No  . Sexual activity: Yes    Partners: Female    Birth control/protection: Post-menopausal  Lifestyle  . Physical activity    Days per week: 0 days    Minutes per session: 0 min  . Stress: To some extent  Relationships  . Social connections    Talks on phone: More than three times a week    Gets together: More than three times a week    Attends religious service: More than 4 times per year    Active member of club or organization: Yes    Attends meetings of clubs or organizations: More than 4 times per year    Relationship status: Married  . Intimate partner violence    Fear of current or ex partner: No    Emotionally abused: No    Physically abused: No    Forced sexual activity: No  Other Topics Concern  . Not on file  Social History Narrative   Marital status: married x 48 years      Children: 1 daughter (83yo Professor Zakariyah Freimark), no grandchildren.       Lives: with wife/[name]      Employment: retired at age 28 years of life; [place] x 12 years      Tobacco: never      Alcohol: rare in 2018; special occasions only      Exercise: walks 4-5 days per week also hikes 4 days per week 1-7 hours per day.  Swimming at [employer].       Advanced Directives; YES; full code but no prolonged measures.      ADLs: independent with ADLs in 2018; drives.    ROS Review of Systems  Constitutional: Negative for activity change, appetite change, chills and fever.  HENT: Negative for congestion, nosebleeds, trouble swallowing and voice change.   Respiratory: Negative for cough, shortness of breath and wheezing.   Gastrointestinal: Negative for diarrhea, nausea and vomiting.  Genitourinary: Negative for difficulty urinating, dysuria, flank pain and hematuria.  Musculoskeletal: Negative for back pain, joint swelling and neck pain.   Neurological: Negative for dizziness, speech difficulty, light-headedness and numbness.  See HPI. All other review of systems negative.    Objective   Vitals as reported by the patient: There were no vitals filed for this visit. No exam performed   Diagnoses and all orders for this visit:  Dizziness -     Cancel: Ambulatory referral to Cardiology -     Ambulatory referral to Cardiology  Hiking on level or elevated terrain -     Cancel: Ambulatory referral to Cardiology -     Ambulatory referral to Cardiology  Atherosclerosis of aorta [facility]) -     Cancel: Ambulatory referral  to Cardiology -     Ambulatory referral to Cardiology  Pre-op testing -     Novel Coronavirus, NAA ([facility]); Future  Patient advised to continiue a gentle terrain He should maintain hydration Continue daily aspirin Advised to re-establish with cardiology Also advised to avoid hiking during high humidity Ordered covid test and discussed with patient that he will need testing prior to the test     I discussed the assessment and treatment plan with the patient. The patient was provided an opportunity to ask questions and all were answered. The patient agreed with the plan and demonstrated an understanding of the instructions.   The patient was advised to call back or seek an in-person evaluation if the symptoms worsen or if the condition fails to improve as anticipated.  I provided 25 minutes of non-face-to-face time during this encounter.  Forrest Moron, MD  Primary Care at [facility]

## 2019-05-04 ENCOUNTER — Encounter: Payer: Self-pay | Admitting: Cardiology

## 2019-05-04 ENCOUNTER — Ambulatory Visit (INDEPENDENT_AMBULATORY_CARE_PROVIDER_SITE_OTHER): Payer: Medicare Other | Admitting: Cardiology

## 2019-05-04 ENCOUNTER — Other Ambulatory Visit: Payer: Self-pay

## 2019-05-04 VITALS — BP 136/80 | HR 63 | Ht 71.65 in | Wt 240.8 lb

## 2019-05-04 DIAGNOSIS — Z01812 Encounter for preprocedural laboratory examination: Secondary | ICD-10-CM | POA: Diagnosis not present

## 2019-05-04 DIAGNOSIS — I2583 Coronary atherosclerosis due to lipid rich plaque: Secondary | ICD-10-CM

## 2019-05-04 DIAGNOSIS — R0789 Other chest pain: Secondary | ICD-10-CM

## 2019-05-04 DIAGNOSIS — R0609 Other forms of dyspnea: Secondary | ICD-10-CM | POA: Diagnosis not present

## 2019-05-04 DIAGNOSIS — I2510 Atherosclerotic heart disease of native coronary artery without angina pectoris: Secondary | ICD-10-CM

## 2019-05-04 DIAGNOSIS — I10 Essential (primary) hypertension: Secondary | ICD-10-CM | POA: Diagnosis not present

## 2019-05-04 DIAGNOSIS — R079 Chest pain, unspecified: Secondary | ICD-10-CM

## 2019-05-04 MED ORDER — METOPROLOL TARTRATE 100 MG PO TABS: 100.0000 mg | ORAL_TABLET | Freq: Once | ORAL | 0 refills | Status: DC

## 2019-05-04 NOTE — Progress Notes (Signed)
Cardiology Office Note:    Date:  05/04/2019   ID:  Christian Parsons, DOB [birth year], MRN [id number]  PCP:  Forrest Moron, MD  Cardiologist:  Candee Furbish, MD  Electrophysiologist:  None   Referring MD: Forrest Moron, MD   Here for the evaluation of CAD at the request of Dr. Nolon Rod  History of Present Illness:    Christian Parsons is a 73 y.o. male with coronary artery disease, bipolar disorder, depression, essential hypertension with chronic kidney disease here for the evaluation of CAD.  Recently during to moderate/easy hikes in the hot humid weather, he was experiencing a sudden lack of propulsion.  He fell behind a group of hikers that he was on a trial of does not more than a dozen of times.  Had a difficult time getting back to the car.  Had to rest.  In both cases had mild chest pain.  Painful stick neph neck at one point.  Exhaustion.  Dr. Burt Knack saw me in the past and believe that the experiences may have been medication related and they stopped Avodart.  Currently on aspirin.  Calcification on artery. Used to weight 285 pounds.   RBBB.  Non smoker Father had angina. NTG. ? About 2 heart attacks with prior hikes.   Went off Avodart and this was improved back with Dr. Burt Knack saw him.  Stress test was reassuring at that time.  He is concerned however about his decreased stamina as described above.  More winded with exertional activity on trails.  Has known coronary artery calcification he states.  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   Past Medical History:  Diagnosis Date  . Anxiety   . Bipolar disorder ([place])    H/O  . BPH with urinary obstruction    s/p TURP  . Chronic kidney disease   . Chronic renal insufficiency, stage 2 (mild)    secondary to lithium toxicity; baseline creatinine 1.3-1.6  . Coronary artery disease   . Depression   . Hypertension     Past Surgical History:   Procedure Laterality Date  . HERNIA REPAIR    . PROSTATE SURGERY    . TRANSURETHRAL RESECTION OF PROSTATE      Current Medications: Current Meds  Medication Sig  . aspirin EC 81 MG tablet Take 81 mg by mouth daily.  Marland Kitchen LAMOTRIGINE PO Take 225 mg by mouth daily.  Marland Kitchen LORazepam (ATIVAN) 0.5 MG tablet Take 0.5 mg by mouth as needed.  . rizatriptan (MAXALT) 10 MG tablet Take 10 mg by mouth as needed for migraine. May repeat in 2 hours if needed  . tamsulosin (FLOMAX) 0.4 MG CAPS capsule Take 0.4 mg by mouth daily.     Allergies:   Patient has no known allergies.   Social History   Socioeconomic History  . Marital status: Married    Spouse name: Not on file  . Number of children: 1  . Years of education: 16+  . Highest education level: Professional school degree (e.g., MD, DDS, DVM, JD)  Occupational History  . Occupation: retired    Comment: Music therapist  Social Needs  . Financial resource strain: Not hard at all  . Food insecurity    Worry: Never true    Inability: Never true  . Transportation needs    Medical: No    Non-medical: No  Tobacco Use  . Smoking status: Never Smoker  .  Smokeless tobacco: Never Used  Substance and Sexual Activity  . Alcohol use: Yes    Alcohol/week: 2.0 standard drinks    Types: 2 Standard drinks or equivalent per week    Comment: Occasional  . Drug use: No  . Sexual activity: Yes    Partners: Female    Birth control/protection: Post-menopausal  Lifestyle  . Physical activity    Days per week: 0 days    Minutes per session: 0 min  . Stress: To some extent  Relationships  . Social connections    Talks on phone: More than three times a week    Gets together: More than three times a week    Attends religious service: More than 4 times per year    Active member of club or organization: Yes    Attends meetings of clubs or organizations: More than 4 times per year    Relationship status: Married  Other Topics Concern  . Not on file   Social History Narrative   Marital status: married x 48 years      Children: 1 daughter (47yo Professor Christian Parsons), no grandchildren.       Lives: with wife/[name]      Employment: retired at age 45 years of life; [place] x 12 years      Tobacco: never      Alcohol: rare in 2018; special occasions only      Exercise: walks 4-5 days per week also hikes 4 days per week 1-7 hours per day.  Swimming at [employer].       Advanced Directives; YES; full code but no prolonged measures.      ADLs: independent with ADLs in 2018; drives.     Family History: The patient's family history includes Arthritis in his sister; Cancer in his mother; Dementia in his father.  ROS:   Please see the history of present illness.    Denies any fevers chills nausea vomiting syncope bleeding all other systems reviewed and are negative.  EKGs/Labs/Other Studies Reviewed:    The following studies were reviewed today: Prior nuclear stress test 2011 overall low risk with no ischemia  EKG:  EKG is ordered today.  The ekg ordered today demonstrates sinus rhythm 64 right bundle branch block  Recent Labs: No results found for requested labs within last 8760 hours.  Recent Lipid Panel    Component Value Date/Time   CHOL 139 02/11/2018 1216   TRIG 142 02/11/2018 1216   HDL 32 (L) 02/11/2018 1216   CHOLHDL 4.3 02/11/2018 1216   CHOLHDL 4.5 07/25/2016 0806   VLDL 30 07/25/2016 0806   LDLCALC 79 02/11/2018 1216    Physical Exam:    VS:  BP 136/80   Pulse 63   Ht 5' 11.65" (1.82 m)   Wt 240 lb 12.8 oz (109.2 kg)   BMI 32.98 kg/m     Wt Readings from Last 3 Encounters:  05/04/19 240 lb 12.8 oz (109.2 kg)  02/11/18 237 lb 3.2 oz (107.6 kg)  01/07/18 242 lb 12.8 oz (110.1 kg)     GEN:  Well nourished, well developed in no acute distress HEENT: Normal NECK: No JVD; No carotid bruits LYMPHATICS: No lymphadenopathy CARDIAC: RRR, no murmurs, rubs, gallops RESPIRATORY:  Clear to  auscultation without rales, wheezing or rhonchi  ABDOMEN: Soft, non-tender, non-distended MUSCULOSKELETAL:  No edema; No deformity  SKIN: Warm and dry NEUROLOGIC:  Alert and oriented x 3 PSYCHIATRIC:  Normal affect   ASSESSMENT:  1. Dyspnea on exertion   2. Pre-procedural laboratory examination   3. Essential hypertension   4. Chest pain of uncertain etiology   5. Coronary artery disease due to lipid rich plaque    PLAN:    In order of problems listed above:  Shortness of breath, possible anginal equivalent while hiking -We will go ahead and check an echocardiogram to ensure proper structure and function of his heart. - I will also check a CT scan with FFR analysis possible to exclude the possibility flow-limiting coronary artery disease as the reason for his symptoms.  Right bundle branch block -Longstanding history of this.  Not new.  Bipolar disorder - Medications reviewed as above.   Medication Adjustments/Labs and Tests Ordered: Current medicines are reviewed at length with the patient today.  Concerns regarding medicines are outlined above.  Orders Placed This Encounter  Procedures  . CT CORONARY MORPH W/CTA COR W/SCORE W/CA W/CM &/OR WO/CM  . CT CORONARY FRACTIONAL FLOW RESERVE DATA PREP  . CT CORONARY FRACTIONAL FLOW RESERVE FLUID ANALYSIS  . Basic Metabolic Panel (BMET)  . EKG 12-Lead  . ECHOCARDIOGRAM COMPLETE   Meds ordered this encounter  Medications  . metoprolol tartrate (LOPRESSOR) 100 MG tablet    Sig: Take 1 tablet (100 mg total) by mouth once for 1 dose.    Dispense:  1 tablet    Refill:  0    Patient Instructions  Medication Instructions:  The current medical regimen is effective;  continue present plan and medications.  If you need a refill on your cardiac medications before your next appointment, please call your pharmacy.   Lab work: [employer] blood work before your CT scan. (BMP) If you have labs (blood work) drawn today and your  tests are completely normal, you will receive your results only by: Marland Kitchen MyChart Message (if you have MyChart) OR . A paper copy in the mail If you have any lab test that is abnormal or we need to change your treatment, we will call you to review the results.  Testing/Procedures: Your physician has requested that you have an echocardiogram. Echocardiography is a painless test that uses sound waves to create images of your heart. It provides your doctor with information about the size and shape of your heart and how well your heart's chambers and valves are working. This procedure takes approximately one hour. There are no restrictions for this procedure.  Your physician has requested that you have cardiac CT. Cardiac computed tomography (CT) is a painless test that uses an x-ray machine to take clear, detailed pictures of your heart.  Please follow instruction sheet as given.  Follow-Up: Follow up will be based on the results of the above testing.  Thank you for choosing [facility]!!     Your cardiac CT will be scheduled at one of the below locations:   [facility] [address] [place], [place] [address] [phone]  Please arrive at the [facility] main entrance of [facility] 30-45 minutes prior to test start time. Proceed to the [facility] Radiology Department (first floor) to check-in and test prep.  Please follow these instructions carefully (unless otherwise directed):  Hold all erectile dysfunction medications at least 48 hours prior to test.  On the Night Before the Test: . Be sure to Drink plenty of water. . Do not consume any caffeinated/decaffeinated beverages or chocolate 12 hours prior to your test. . Do not take any antihistamines 12 hours prior  to your test. . If you take Metformin do not take 24 hours prior to test.  On the Day of the Test: . Drink plenty of water. Do not drink any water within one hour of the test. . Do not eat  any food 4 hours prior to the test. . You may take your regular medications prior to the test.  . Take metoprolol (Lopressor) two hours prior to test. . HOLD Furosemide/Hydrochlorothiazide morning of the test.    After the Test: . Drink plenty of water. . After receiving IV contrast, you may experience a mild flushed feeling. This is normal. . On occasion, you may experience a mild rash up to 24 hours after the test. This is not dangerous. If this occurs, you can take Benadryl 25 mg and increase your fluid intake. . If you experience trouble breathing, this can be serious. If it is severe call 911 IMMEDIATELY. If it is mild, please call our office. . If you take any of these medications: Glipizide/Metformin, Avandament, Glucavance, please do not take 48 hours after completing test.   Please contact the cardiac imaging nurse navigator should you have any questions/concerns Marchia Bond, RN Navigator Cardiac Imaging [facility] Heart and Vascular Services [phone] Office  [phone] Cell      Signed, Candee Furbish, MD  05/04/2019 3:02 PM    [place]

## 2019-05-04 NOTE — Patient Instructions (Addendum)
Medication Instructions:  The current medical regimen is effective;  continue present plan and medications.  If you need a refill on your cardiac medications before your next appointment, please call your pharmacy.   Lab work: [employer] blood work before your CT scan. (BMP) If you have labs (blood work) drawn today and your tests are completely normal, you will receive your results only by: Marland Kitchen MyChart Message (if you have MyChart) OR . A paper copy in the mail If you have any lab test that is abnormal or we need to change your treatment, we will call you to review the results.  Testing/Procedures: Your physician has requested that you have an echocardiogram. Echocardiography is a painless test that uses sound waves to create images of your heart. It provides your doctor with information about the size and shape of your heart and how well your heart's chambers and valves are working. This procedure takes approximately one hour. There are no restrictions for this procedure.  Your physician has requested that you have cardiac CT. Cardiac computed tomography (CT) is a painless test that uses an x-ray machine to take clear, detailed pictures of your heart.  Please follow instruction sheet as given.  Follow-Up: Follow up will be based on the results of the above testing.  Thank you for choosing [place]!!     Your cardiac CT will be scheduled at one of the below locations:   [facility] [address] [place], [place] [address] [phone]  Please arrive at the [facility] main entrance of [facility] 30-45 minutes prior to test start time. Proceed to the [facility] Radiology Department (first floor) to check-in and test prep.  Please follow these instructions carefully (unless otherwise directed):  Hold all erectile dysfunction medications at least 48 hours prior to test.  On the Night Before the Test: . Be sure to Drink plenty of water. . Do  not consume any caffeinated/decaffeinated beverages or chocolate 12 hours prior to your test. . Do not take any antihistamines 12 hours prior to your test. . If you take Metformin do not take 24 hours prior to test.  On the Day of the Test: . Drink plenty of water. Do not drink any water within one hour of the test. . Do not eat any food 4 hours prior to the test. . You may take your regular medications prior to the test.  . Take metoprolol (Lopressor) two hours prior to test. . HOLD Furosemide/Hydrochlorothiazide morning of the test.    After the Test: . Drink plenty of water. . After receiving IV contrast, you may experience a mild flushed feeling. This is normal. . On occasion, you may experience a mild rash up to 24 hours after the test. This is not dangerous. If this occurs, you can take Benadryl 25 mg and increase your fluid intake. . If you experience trouble breathing, this can be serious. If it is severe call 911 IMMEDIATELY. If it is mild, please call our office. . If you take any of these medications: Glipizide/Metformin, Avandament, Glucavance, please do not take 48 hours after completing test.   Please contact the cardiac imaging nurse navigator should you have any questions/concerns Marchia Bond, RN Navigator Cardiac [place] and Vascular Services [phone] Office  [phone] Cell

## 2019-05-05 ENCOUNTER — Telehealth (HOSPITAL_COMMUNITY): Payer: Self-pay | Admitting: Radiology

## 2019-05-05 NOTE — Telephone Encounter (Signed)
Called patient to reschedule echocardiogram from 835 to 920 on Monday August 24.

## 2019-05-10 ENCOUNTER — Ambulatory Visit (HOSPITAL_COMMUNITY): Payer: Medicare Other | Attending: Internal Medicine

## 2019-05-10 ENCOUNTER — Other Ambulatory Visit: Payer: Self-pay

## 2019-05-10 DIAGNOSIS — R0609 Other forms of dyspnea: Secondary | ICD-10-CM | POA: Insufficient documentation

## 2019-05-13 NOTE — Telephone Encounter (Signed)
Lets set Christian Parsons up for a virtual visit on September 3 at 10:00.  Please see patient advice request.  He has several questions.  Please explained to him that I would like to have a dialogue with him and this is challenging to do over the patient message request.  Candee Furbish, MD

## 2019-05-13 NOTE — Progress Notes (Signed)
Scheduled w/ Orland Mustard

## 2019-05-19 ENCOUNTER — Telehealth: Payer: Self-pay

## 2019-05-19 NOTE — Telephone Encounter (Signed)
CALLED PT AND SPOKE TO PT TO REVIEW MEDS FOR APPT ON 05/19/19

## 2019-05-20 ENCOUNTER — Telehealth (INDEPENDENT_AMBULATORY_CARE_PROVIDER_SITE_OTHER): Payer: Medicare Other | Admitting: Cardiology

## 2019-05-20 ENCOUNTER — Other Ambulatory Visit: Payer: Self-pay

## 2019-05-20 VITALS — BP 121/76 | HR 80 | Ht 72.0 in

## 2019-05-20 DIAGNOSIS — I2510 Atherosclerotic heart disease of native coronary artery without angina pectoris: Secondary | ICD-10-CM

## 2019-05-20 DIAGNOSIS — I700 Atherosclerosis of aorta: Secondary | ICD-10-CM

## 2019-05-20 DIAGNOSIS — I77810 Thoracic aortic ectasia: Secondary | ICD-10-CM

## 2019-05-20 DIAGNOSIS — I2584 Coronary atherosclerosis due to calcified coronary lesion: Secondary | ICD-10-CM

## 2019-05-20 DIAGNOSIS — R0609 Other forms of dyspnea: Secondary | ICD-10-CM

## 2019-05-20 NOTE — Progress Notes (Signed)
Virtual Visit via Video Note   This visit type was conducted due to national recommendations for restrictions regarding the COVID-19 Pandemic (e.g. social distancing) in an effort to limit this patient's exposure and mitigate transmission in our community.  Due to his co-morbid illnesses, this patient is at least at moderate risk for complications without adequate follow up.  This format is felt to be most appropriate for this patient at this time.  All issues noted in this document were discussed and addressed.  A limited physical exam was performed with this format.  Please refer to the patient's chart for his consent to telehealth for [facility].   Date:  05/20/2019   ID:  Christian Parsons, DOB 1945/10/23, MRN [id number]  Patient Location: Home Provider Location: Office  PCP:  Forrest Moron, MD  Cardiologist:  Candee Furbish, MD  Electrophysiologist:  None   Evaluation Performed:  Follow-Up Visit  Chief Complaint: Discuss test results  History of Present Illness:    Christian Parsons is a 73 y.o. male with bipolar disorder, aortic atherosclerosis, family history of angina (father), dilated ascending aorta (59m on ECHO) with decreased exercise tolerance, mild chest pain during prior [place]   Has lost weight from 285.Down another 5 pounds. 229 now.   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  Had surgery, Cipro, lengthy recovery.  Weight gain.  lipid lab was November 28, 2018 ordered by Doctor STamala Julian [facility].  My cholesterol was 121, triglycerides 90, LDL 70, HDL 33  [place] and [place] well.  He wonders if his symptoms were related to heat and humidity.  Once again, he knew about his aortic atherosclerosis he states for 20 years.  In the past, his lipids were higher secondary to worse lifestyle.  Certainly now, over the last few  years he has done very well with diet modification and exercise.   The patient does not have symptoms concerning for COVID-19 infection (fever, chills, cough, or new shortness of breath).    Past Medical History:  Diagnosis Date   Anxiety    Bipolar disorder ([place]    H/[place]   BPH with urinary obstruction    s/p TURP   Chronic kidney disease    Chronic renal insufficiency, stage 2 (mild)    secondary to lithium toxicity; baseline creatinine 1.3-1.6   Coronary artery disease    Depression    Hypertension    Past Surgical History:  Procedure Laterality Date   HERNIA REPAIR     PROSTATE SURGERY     TRANSURETHRAL RESECTION OF PROSTATE       No outpatient medications have been marked as taking for the 05/20/19 encounter (Telemedicine) with SJerline Pain MD.     Allergies:   Patient has no known allergies.   Social History   Tobacco Use   Smoking status: Never Smoker   Smokeless tobacco: Never Used  Substance Use Topics   Alcohol use: Yes    Alcohol/week: 2.0 standard drinks    Types: 2 Standard drinks or equivalent per week    Comment: Occasional   Drug use: No     Family Hx: The patient's family history includes Arthritis in his sister; Cancer in his mother; Dementia in his father.  ROS:   Please see the history of present illness.    No fevers chills nausea vomiting syncope bleeding  All other systems reviewed and are negative.   Prior CV studies:   The following studies were reviewed today:  ECHO 05/10/19  1. The left ventricle has normal systolic function with an ejection fraction of 60-65%. The cavity size was normal. Left ventricular diastolic Doppler parameters are consistent with impaired relaxation. Indeterminate filling pressures The E/e' is 8-15.  No evidence of left ventricular regional wall motion abnormalities.  2. The right ventricle has normal systolic function. The cavity was normal. There is no increase in right ventricular wall  thickness.  3. The mitral valve is abnormal. Mild thickening of the mitral valve leaflet.  4. The tricuspid valve is grossly normal.  5. The aortic valve is tricuspid. Mild sclerosis of the aortic valve. Aortic valve regurgitation trivial to mild. No stenosis of the aortic valve.  6. The aorta is abnormal unless otherwise noted.  7. There is moderate dilatation of the ascending aorta measuring 45 mm.  SUMMARY   LVEF 60-65%, normal wall thickness, normal wall motion, grade 1 DD, indeterminate LV filling pressure, normal RV systolic function, trivial MR, aortic valve sclerosis with trivial to mild AI, dilated ascending aorta to 4.5 cm  Abdominal CT  - Coronary calcification (may be MAC)   - Atherosclerotic calcifications of aorta and iliac arteries.  Labs/Other Tests and Data Reviewed:    EKG:  Sinus rhythm right bundle branch block personally reviewed  Recent Labs: No results found for requested labs within last 8760 hours.   Recent Lipid Panel Lab Results  Component Value Date/Time   CHOL 139 02/11/2018 12:16 PM   TRIG 142 02/11/2018 12:16 PM   HDL 32 (L) 02/11/2018 12:16 PM   CHOLHDL 4.3 02/11/2018 12:16 PM   CHOLHDL 4.5 07/25/2016 08:06 AM   LDLCALC 79 02/11/2018 12:16 PM    Wt Readings from Last 3 Encounters:  05/04/19 240 lb 12.8 oz (109.2 kg)  02/11/18 237 lb 3.2 oz (107.6 kg)  01/07/18 242 lb 12.8 oz (110.1 kg)     Objective:    Vital Signs:  BP 121/76    Pulse 80    Ht 6' (1.829 m)    BMI 32.66 kg/m    VITAL SIGNS:  reviewed GEN:  no acute distress EYES:  sclerae anicteric, EOMI - Extraocular Movements Intact RESPIRATORY:  normal respiratory effort, symmetric expansion SKIN:  no rash, lesions or ulcers. MUSCULOSKELETAL:  no obvious deformities. NEURO:  alert and oriented x 3, no obvious focal deficit PSYCH:  normal affect  ASSESSMENT & PLAN:    Coronary artery and aortic atherosclerosis with symptoms of exertional fatigue and mild chest pain  -  recommended proceeding with coronary CT with possible FFR and evaluation of aorta (dilated on ECHO 59m).    - ECHO with reassuring pump function.    Elevated blood pressure periodically - At home, blood pressures usually are running in the 120/70 range.  Excellent.  They do fluctuate somewhat.  He is not on any antihypertensive.  Lamotrigine was decreased which should help.  Dilated aortic root - We are going to verify measurements on CT scan on 8 September. -Maintain excellent blood pressure.  Aortic atherosclerosis - Even with his excellent LDL of approximately 70 secondary to diet and exercise, I explained the rationale for statin use for plaque stabilization and stroke and heart attack risk prevention.  He will consider.  Does have friends who have had muscle aches and he is worried about this hurting his hiking efforts understandably.  I would likely use Crestor  5 mg once a day.  Continue with excellent weight loss. He hikes several trials in [place].  Excellent.    COVID-19 Education: The signs and symptoms of COVID-19 were discussed with the patient and how to seek care for testing (follow up with PCP or arrange E-visit).  The importance of social distancing was discussed today.  Time:   Today, I have spent 35 minutes with the patient with telehealth technology discussing the above problems.     Medication Adjustments/Labs and Tests Ordered: Current medicines are reviewed at length with the patient today.  Concerns regarding medicines are outlined above.   Tests Ordered: No orders of the defined types were placed in this encounter.   Medication Changes: No orders of the defined types were placed in this encounter.   Follow Up:  Virtual Visit or In Person will set up after CT  Signed, Candee Furbish, MD  05/20/2019 10:48 AM    [place]

## 2019-05-20 NOTE — Patient Instructions (Signed)
Medication Instructions:  The current medical regimen is effective;  continue present plan and medications.  If you need a refill on your cardiac medications before your next appointment, please call your pharmacy.   Follow-Up: Follow up as scheduled.  Thank you for choosing [facility]!!     

## 2019-05-25 ENCOUNTER — Ambulatory Visit: Payer: Medicare Other | Admitting: Registered Nurse

## 2019-05-25 ENCOUNTER — Telehealth (HOSPITAL_COMMUNITY): Payer: Self-pay | Admitting: Emergency Medicine

## 2019-05-25 NOTE — Telephone Encounter (Signed)
Left message on voicemail with name and callback number Idabelle Mcpeters RN Navigator Cardiac Imaging  Heart and Vascular Services [phone] Office [phone] Cell  

## 2019-05-26 ENCOUNTER — Ambulatory Visit (HOSPITAL_COMMUNITY)
Admission: RE | Admit: 2019-05-26 | Discharge: 2019-05-26 | Disposition: A | Discharge status: Home / Self Care | Payer: Medicare Other | Source: Ambulatory Visit | Attending: Cardiology | Admitting: Cardiology

## 2019-05-26 ENCOUNTER — Other Ambulatory Visit: Payer: Self-pay

## 2019-05-26 ENCOUNTER — Encounter (HOSPITAL_COMMUNITY): Payer: Self-pay

## 2019-05-26 DIAGNOSIS — R079 Chest pain, unspecified: Secondary | ICD-10-CM

## 2019-05-26 DIAGNOSIS — R0609 Other forms of dyspnea: Secondary | ICD-10-CM | POA: Diagnosis not present

## 2019-05-26 DIAGNOSIS — I2583 Coronary atherosclerosis due to lipid rich plaque: Secondary | ICD-10-CM | POA: Diagnosis present

## 2019-05-26 DIAGNOSIS — I2510 Atherosclerotic heart disease of native coronary artery without angina pectoris: Secondary | ICD-10-CM | POA: Diagnosis present

## 2019-05-26 DIAGNOSIS — R0789 Other chest pain: Secondary | ICD-10-CM | POA: Diagnosis present

## 2019-05-26 DIAGNOSIS — Z006 Encounter for examination for normal comparison and control in clinical research program: Secondary | ICD-10-CM

## 2019-05-26 LAB — POCT I-STAT CREATININE: Creatinine, Ser: 1.7 mg/dL — ABNORMAL HIGH (ref 0.61–1.24)

## 2019-05-26 MED ORDER — METOPROLOL TARTRATE 5 MG/5ML IV SOLN
5.0000 mg | INTRAVENOUS | Status: DC | PRN
  Filled 2019-05-26: qty 5

## 2019-05-26 MED ORDER — NITROGLYCERIN 0.4 MG SL SUBL
0.8000 mg | SUBLINGUAL_TABLET | Freq: Once | SUBLINGUAL | Status: DC
Start: 2019-05-26 — End: 2019-05-27
  Filled 2019-05-26: qty 25

## 2019-05-26 NOTE — Progress Notes (Signed)
Patient labs were obtained for istat creatinine per protocol. Creatinine 1.7. GFR also elevated per CT. Marlou Porch, MD notified about situation and for advice on how to proceed based on patient condition. Per MD needs of testing are greater then risk at this time. Will proceed with test.

## 2019-05-26 NOTE — Progress Notes (Signed)
Patient now refusing test. Patient states his kidney function has made a significant jump and refuses to proceed. This RN explained risks vs benefits and MD instructions to protect kidneys post test. Patient still refuses and states he wants to leave. Patient D/C and CT navigator as well as MD informed.

## 2019-05-26 NOTE — Research (Signed)
Cadfem Informed Consent    Patient Name: Christian Parsons   Subject met inclusion and exclusion criteria.  The informed consent form, study requirements and expectations were reviewed with the subject and questions and concerns were addressed prior to the signing of the consent form.  The subject verbalized understanding of the trail requirements.  The subject agreed to participate in the CADFEM trial and signed the informed consent.  The informed consent was obtained prior to performance of any protocol-specific procedures for the subject.  A copy of the signed informed consent was given to the subject and a copy was placed in the subject's medical record.   Neva Seat

## 2019-06-04 ENCOUNTER — Other Ambulatory Visit: Payer: Self-pay

## 2019-06-04 ENCOUNTER — Ambulatory Visit (INDEPENDENT_AMBULATORY_CARE_PROVIDER_SITE_OTHER): Payer: Medicare Other | Admitting: Registered Nurse

## 2019-06-04 ENCOUNTER — Encounter: Payer: Self-pay | Admitting: Registered Nurse

## 2019-06-04 VITALS — BP 120/70 | HR 71 | Temp 98.0°F | Resp 16 | Wt 235.0 lb

## 2019-06-04 DIAGNOSIS — N182 Chronic kidney disease, stage 2 (mild): Secondary | ICD-10-CM | POA: Diagnosis not present

## 2019-06-04 DIAGNOSIS — Z7189 Other specified counseling: Secondary | ICD-10-CM | POA: Diagnosis not present

## 2019-06-04 DIAGNOSIS — Z7689 Persons encountering health services in other specified circumstances: Secondary | ICD-10-CM | POA: Diagnosis not present

## 2019-06-04 DIAGNOSIS — Z13220 Encounter for screening for lipoid disorders: Secondary | ICD-10-CM

## 2019-06-04 NOTE — Progress Notes (Signed)
Established Patient Office Visit  Subjective:  Patient ID: Christian Parsons, male    DOB: 1945/12/04  Age: 73 y.o. MRN: [id number]  CC:  Chief Complaint  Patient presents with   Establish Care    pt statets he needs a pcp to manage care and go over his labs and cardio imaging done.     HPI Christian Parsons presents for visit to establish care. He is formerly a patient of Dr. Tamala Julian.   He had a recent incident in which he noted upper right chest pain. He was seen by Dr. Nolon Rod and has had workup with cariology, which performed echo and stress test. Results were relatively encouraging, though cardiology wants to perform further testing. Overall, he is on a yearly follow up with cardiology  Today, Christian Parsons is interested in discussing his ongoing conditions and plan of care. He is interested in discussing contrast vs no contrast for procedures, blood pressure goals, and what his lifestyle goals are.  He is feeling well overall. He follows with our office, cardiology, and psychiatry.  Past Medical History:  Diagnosis Date   Anxiety    Bipolar disorder ([place])    H/O   BPH with urinary obstruction    s/p TURP   Chronic kidney disease    Chronic renal insufficiency, stage 2 (mild)    secondary to lithium toxicity; baseline creatinine 1.3-1.6   Coronary artery disease    Depression    Hypertension     Past Surgical History:  Procedure Laterality Date   HERNIA REPAIR     PROSTATE SURGERY     TRANSURETHRAL RESECTION OF PROSTATE      Family History  Problem Relation Age of Onset   Cancer Mother        breast cancer   Dementia Father    Arthritis Sister        hip OA s/p hip replacement    Social History   Socioeconomic History   Marital status: Married    Spouse name: Not on file   Number of children: 1   Years of education: 16+   Highest education level: Professional school degree (e.g., MD, DDS, DVM, JD)  Occupational History   Occupation: retired     Comment: Research officer, 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   Food insecurity    Worry: Never true    Inability: Never true   Transportation needs    Medical: No    Non-medical: No  Tobacco Use   Smoking status: Never Smoker   Smokeless tobacco: Never Used  Substance and Sexual Activity   Alcohol use: Yes    Alcohol/week: 2.0 standard drinks    Types: 2 Standard drinks or equivalent per week    Comment: Occasional   Drug use: No   Sexual activity: Yes    Partners: Female    Birth control/protection: Post-menopausal  Lifestyle   Physical activity    Days per week: 0 days    Minutes per session: 0 min   Stress: To some extent  Relationships   Social connections    Talks on phone: More than three times a week    Gets together: More than three times a week    Attends religious service: More than 4 times per year    Active member of club or organization: Yes    Attends meetings of clubs or organizations: More than 4 times per year    Relationship status: Married  Intimate partner violence    Fear of current or ex partner: No    Emotionally abused: No    Physically abused: No    Forced sexual activity: No  Other Topics Concern   Not on file  Social History Narrative   Marital status: married x 48 years      Children: 1 daughter (40yo Professor Paxon Cavitt), no grandchildren.       Lives: with wife/[name]      Employment: retired at age 78 years of life; [place] x 12 years      Tobacco: never      Alcohol: rare in 2018; special occasions only      Exercise: walks 4-5 days per week also hikes 4 days per week 1-7 hours per day.  Swimming at [employer].       Advanced Directives; YES; full code but no prolonged measures.      ADLs: independent with ADLs in 2018; drives.    Outpatient Medications Prior to Visit  Medication Sig Dispense Refill   aspirin EC 81 MG tablet Take 81 mg by mouth daily.     FLUZONE  HIGH-DOSE QUADRIVALENT 0.7 ML SUSY TO BE ADMINISTERED BY PHARMACIST FOR IMMUNIZATION     LAMOTRIGINE PO Take 225 mg by mouth daily.     LORazepam (ATIVAN) 0.5 MG tablet Take 0.5 mg by mouth as needed.     rizatriptan (MAXALT) 10 MG tablet Take 10 mg by mouth as needed for migraine. May repeat in 2 hours if needed     tamsulosin (FLOMAX) 0.4 MG CAPS capsule Take 0.4 mg by mouth daily.     No facility-administered medications prior to visit.     No Known Allergies  ROS Review of Systems  Constitutional: Negative.   HENT: Negative.   Eyes: Negative.   Respiratory: Negative.   Cardiovascular: Negative.   Gastrointestinal: Negative.   Endocrine: Negative.   Genitourinary: Negative.   Musculoskeletal: Negative.   Skin: Negative.   Allergic/Immunologic: Negative.   Neurological: Negative.   Hematological: Negative.   Psychiatric/Behavioral: Negative.   All other systems reviewed and are negative.     Objective:    Physical Exam  Constitutional: He is oriented to person, place, and time. He appears well-developed and well-nourished. No distress.  Cardiovascular: Normal rate, regular rhythm and normal heart sounds.  Pulmonary/Chest: Effort normal. No respiratory distress.  Neurological: He is alert and oriented to person, place, and time.  Skin: Skin is warm and dry. No rash noted. He is not diaphoretic. No erythema. No pallor.  Psychiatric: He has a normal mood and affect. His behavior is normal. Judgment and thought content normal.  Nursing note and vitals reviewed.   BP 139/80    Pulse 71    Temp 98 F (36.7 C) (Oral)    Resp 16    Wt 235 lb (106.6 kg)    SpO2 97%    BMI 31.87 kg/m  Wt Readings from Last 3 Encounters:  06/04/19 235 lb (106.6 kg)  05/04/19 240 lb 12.8 oz (109.2 kg)  02/11/18 237 lb 3.2 oz (107.6 kg)     Health Maintenance Due  Topic Date Due   INFLUENZA VACCINE  04/17/2019    There are no preventive care reminders to display for this  patient.  Lab Results  Component Value Date   TSH 1.006 08/25/2015   Lab Results  Component Value Date   WBC 4.8 11/12/2017   HGB 15.8 11/12/2017   HCT  45.1 11/12/2017   MCV 90 11/12/2017   PLT 167 11/12/2017   Lab Results  Component Value Date   NA 144 02/11/2018   K 5.3 (H) 02/11/2018   CO2 22 02/11/2018   GLUCOSE 92 02/11/2018   BUN 23 02/11/2018   CREATININE 1.70 (H) 05/26/2019   BILITOT 0.6 02/11/2018   ALKPHOS 81 02/11/2018   AST 19 02/11/2018   ALT 17 02/11/2018   PROT 7.0 02/11/2018   ALBUMIN 4.5 02/11/2018   CALCIUM 9.8 02/11/2018   Lab Results  Component Value Date   CHOL 139 02/11/2018   Lab Results  Component Value Date   HDL 32 (L) 02/11/2018   Lab Results  Component Value Date   LDLCALC 79 02/11/2018   Lab Results  Component Value Date   TRIG 142 02/11/2018   Lab Results  Component Value Date   CHOLHDL 4.3 02/11/2018   Lab Results  Component Value Date   HGBA1C 5.4 08/25/2015      Assessment & Plan:   Problem List Items Addressed This Visit    None    Visit Diagnoses    Encounter to establish care    -  Primary   Screening for endocrine, metabolic and immunity disorder       Lipid screening          No orders of the defined types were placed in this encounter.   Follow-up: Return in about 3 months (around 09/03/2019) for BP check, med check, chronic conditions.   PLAN  Blood pressure well within goal today at 120/70. This is a very encouraging number for him and for [place] - we discussed that this number is not concerning and does not warrant antihypertensive treatment at this time.  Ordered labs, will follow up as warranted  Discussed weight loss goals  Discussed course of care  I spent around 35 minutes with this patient, more than 50% of which was spent educating/counseling.  Patient encouraged to call clinic with any questions, comments, or concerns.   Maximiano Coss, NP

## 2019-06-04 NOTE — Addendum Note (Signed)
Addended by: Maximiano Coss on: 06/04/2019 02:31 PM   Modules accepted: Orders

## 2019-06-04 NOTE — Patient Instructions (Signed)
° ° ° °  If you have lab work done today you will be contacted with your lab results within the next 2 weeks.  If you have not heard from us then please contact us. The fastest way to get your results is to register for My Chart. ° ° °IF you received an x-ray today, you will receive an invoice from [facility]. Please contact [facility] at [phone] with questions or concerns regarding your invoice.  ° °IF you received labwork today, you will receive an invoice from [facility]. Please contact [facility] at [phone] with questions or concerns regarding your invoice.  ° °Our billing staff will not be able to assist you with questions regarding bills from these companies. ° °You will be contacted with the lab results as soon as they are available. The fastest way to get your results is to activate your My Chart account. Instructions are located on the last page of this paperwork. If you have not heard from us regarding the results in 2 weeks, please contact this office. °  ° ° ° °

## 2019-06-05 LAB — CBC WITH DIFFERENTIAL/PLATELET
Basophils Absolute: 0 10*3/uL (ref 0.0–0.2)
Basos: 1 %
EOS (ABSOLUTE): 0.1 10*3/uL (ref 0.0–0.4)
Eos: 3 %
Hematocrit: 44.4 % (ref 37.5–51.0)
Hemoglobin: 15.3 g/dL (ref 13.0–17.7)
Immature Grans (Abs): 0 10*3/uL (ref 0.0–0.1)
Immature Granulocytes: 0 %
Lymphocytes Absolute: 0.9 10*3/uL (ref 0.7–3.1)
Lymphs: 16 %
MCH: 31.7 pg (ref 26.6–33.0)
MCHC: 34.5 g/dL (ref 31.5–35.7)
MCV: 92 fL (ref 79–97)
Monocytes Absolute: 0.5 10*3/uL (ref 0.1–0.9)
Monocytes: 9 %
Neutrophils Absolute: 3.9 10*3/uL (ref 1.4–7.0)
Neutrophils: 71 %
Platelets: 153 10*3/uL (ref 150–450)
RBC: 4.82 x10E6/uL (ref 4.14–5.80)
RDW: 13 % (ref 11.6–15.4)
WBC: 5.4 10*3/uL (ref 3.4–10.8)

## 2019-06-05 LAB — COMPREHENSIVE METABOLIC PANEL
ALT: 21 IU/L (ref 0–44)
AST: 24 IU/L (ref 0–40)
Albumin/Globulin Ratio: 1.6 (ref 1.2–2.2)
Albumin: 4.3 g/dL (ref 3.7–4.7)
Alkaline Phosphatase: 84 IU/L (ref 39–117)
BUN/Creatinine Ratio: 16 (ref 10–24)
BUN: 24 mg/dL (ref 8–27)
Bilirubin Total: 0.4 mg/dL (ref 0.0–1.2)
CO2: 22 mmol/L (ref 20–29)
Calcium: 9.6 mg/dL (ref 8.6–10.2)
Chloride: 106 mmol/L (ref 96–106)
Creatinine, Ser: 1.46 mg/dL — ABNORMAL HIGH (ref 0.76–1.27)
GFR calc Af Amer: 55 mL/min/{1.73_m2} — ABNORMAL LOW (ref >59)
GFR calc non Af Amer: 48 mL/min/{1.73_m2} — ABNORMAL LOW (ref >59)
Globulin, Total: 2.7 g/dL (ref 1.5–4.5)
Glucose: 95 mg/dL (ref 65–99)
Potassium: 4.5 mmol/L (ref 3.5–5.2)
Sodium: 144 mmol/L (ref 134–144)
Total Protein: 7 g/dL (ref 6.0–8.5)

## 2019-06-05 LAB — LIPID PANEL
Chol/HDL Ratio: 4.3 ratio (ref 0.0–5.0)
Cholesterol, Total: 137 mg/dL (ref 100–199)
HDL: 32 mg/dL — ABNORMAL LOW (ref >39)
LDL Chol Calc (NIH): 71 mg/dL (ref 0–99)
Triglycerides: 201 mg/dL — ABNORMAL HIGH (ref 0–149)
VLDL Cholesterol Cal: 34 mg/dL (ref 5–40)

## 2019-06-05 LAB — NOVEL CORONAVIRUS, NAA: SARS-CoV-2, NAA: NOT DETECTED

## 2019-06-07 ENCOUNTER — Encounter: Payer: Self-pay | Admitting: Registered Nurse

## 2019-06-08 NOTE — Telephone Encounter (Signed)
Please order MRI of aorta.  45 mm ascending aorta.  Dilated.  No gadolinium (contrast).  Please order treadmill nuclear stress test-chest pain.  Previous CT of coronaries was canceled secondary to creatinine chronically elevated 1.8.  Thank you.  Candee Furbish, MD

## 2019-06-09 ENCOUNTER — Encounter: Payer: Self-pay | Admitting: *Deleted

## 2019-06-09 ENCOUNTER — Telehealth: Payer: Self-pay | Admitting: *Deleted

## 2019-06-09 DIAGNOSIS — I2583 Coronary atherosclerosis due to lipid rich plaque: Secondary | ICD-10-CM

## 2019-06-09 DIAGNOSIS — I2510 Atherosclerotic heart disease of native coronary artery without angina pectoris: Secondary | ICD-10-CM

## 2019-06-09 DIAGNOSIS — R079 Chest pain, unspecified: Secondary | ICD-10-CM

## 2019-06-09 DIAGNOSIS — I77810 Thoracic aortic ectasia: Secondary | ICD-10-CM

## 2019-06-09 NOTE — Telephone Encounter (Signed)
Orders placed for MRI and myoview stress testing.  Message sent through [place] regarding orders and that he will be contacted to be scheduled.  Letter of instructions in chart and needs to be completed with date/time once scheduled.  Pt also need COVID testing prior to his myoview.

## 2019-06-09 NOTE — Telephone Encounter (Signed)
Jerline Pain, MD Yesterday (9:11 AM)     Please order MRI of aorta.  45 mm ascending aorta.  Dilated.  No gadolinium (contrast).  Please order treadmill nuclear stress test-chest pain.  Previous CT of coronaries was canceled secondary to creatinine chronically elevated 1.8.  Thank you.  Candee Furbish, MD

## 2019-06-16 NOTE — Telephone Encounter (Signed)
Myoview scheduled for 10/7.  Instructions for testing sent to pt through [place]. COVID testing has been scheduled as well.

## 2019-06-18 ENCOUNTER — Other Ambulatory Visit (HOSPITAL_COMMUNITY): Payer: Medicare Other

## 2019-06-19 ENCOUNTER — Other Ambulatory Visit (HOSPITAL_COMMUNITY): Payer: Medicare Other

## 2019-06-21 ENCOUNTER — Telehealth (HOSPITAL_COMMUNITY): Payer: Self-pay | Admitting: *Deleted

## 2019-06-21 ENCOUNTER — Other Ambulatory Visit (HOSPITAL_COMMUNITY)
Admission: RE | Admit: 2019-06-21 | Discharge: 2019-06-21 | Disposition: A | Discharge status: Home / Self Care | Payer: Medicare Other | Source: Ambulatory Visit | Attending: Cardiology | Admitting: Cardiology

## 2019-06-21 DIAGNOSIS — Z01812 Encounter for preprocedural laboratory examination: Secondary | ICD-10-CM | POA: Insufficient documentation

## 2019-06-21 DIAGNOSIS — Z20828 Contact with and (suspected) exposure to other viral communicable diseases: Secondary | ICD-10-CM | POA: Insufficient documentation

## 2019-06-21 LAB — SARS CORONAVIRUS 2 (TAT 6-24 HRS): SARS Coronavirus 2: NEGATIVE

## 2019-06-21 NOTE — Telephone Encounter (Signed)
Patient given detailed instructions per Myocardial Perfusion Study Information Sheet for the test on 06/23/19 at 10:15. Patient notified to arrive 15 minutes early and that it is imperative to arrive on time for appointment to keep from having the test rescheduled.  If you need to cancel or reschedule your appointment, please call the office within 24 hours of your appointment. . Patient verbalized understanding.Christian Parsons

## 2019-06-23 ENCOUNTER — Ambulatory Visit (HOSPITAL_COMMUNITY): Payer: Medicare Other | Attending: Cardiology

## 2019-06-23 ENCOUNTER — Other Ambulatory Visit: Payer: Self-pay

## 2019-06-23 DIAGNOSIS — I2510 Atherosclerotic heart disease of native coronary artery without angina pectoris: Secondary | ICD-10-CM | POA: Diagnosis not present

## 2019-06-23 DIAGNOSIS — I2583 Coronary atherosclerosis due to lipid rich plaque: Secondary | ICD-10-CM | POA: Diagnosis not present

## 2019-06-23 DIAGNOSIS — I2584 Coronary atherosclerosis due to calcified coronary lesion: Secondary | ICD-10-CM | POA: Insufficient documentation

## 2019-06-23 DIAGNOSIS — R079 Chest pain, unspecified: Secondary | ICD-10-CM | POA: Diagnosis not present

## 2019-06-23 LAB — MYOCARDIAL PERFUSION IMAGING
Estimated workload: 10.1 METS
Exercise duration (min): 9 min
Exercise duration (sec): 0 s
LV dias vol: 131 mL (ref 62–150)
LV sys vol: 56 mL
MPHR: 149 {beats}/min
Peak HR: 131 {beats}/min
Percent HR: 87 %
Rest HR: 68 {beats}/min
SDS: 0
SRS: 0
SSS: 0
TID: 0.97

## 2019-06-23 MED ORDER — TECHNETIUM TC 99M TETROFOSMIN IV KIT
31.8000 | PACK | Freq: Once | INTRAVENOUS | Status: AC | PRN
  Administered 2019-06-23: 31.8 via INTRAVENOUS
  Filled 2019-06-23: qty 32

## 2019-06-23 MED ORDER — TECHNETIUM TC 99M TETROFOSMIN IV KIT
10.6000 | PACK | Freq: Once | INTRAVENOUS | Status: AC | PRN
  Administered 2019-06-23: 10.6 via INTRAVENOUS
  Filled 2019-06-23: qty 11

## 2019-06-29 NOTE — Telephone Encounter (Signed)
Lot's of good questions. Needs another appointment to discuss. Please sched.   Thanks Candee Furbish, MD

## 2019-06-30 ENCOUNTER — Encounter: Payer: Self-pay | Admitting: Registered Nurse

## 2019-07-01 NOTE — Telephone Encounter (Signed)
MRI has not been scheduled at this time.  Pt has a f/u appt with Dr Marlou Porch scheduled for 10/20 to discuss myoview results and any need for further testing.

## 2019-07-02 ENCOUNTER — Other Ambulatory Visit: Payer: Self-pay | Admitting: Registered Nurse

## 2019-07-02 DIAGNOSIS — G43109 Migraine with aura, not intractable, without status migrainosus: Secondary | ICD-10-CM

## 2019-07-02 MED ORDER — RIZATRIPTAN BENZOATE 10 MG PO TABS: 10.0000 mg | ORAL_TABLET | ORAL | 3 refills | Status: DC | PRN

## 2019-07-06 ENCOUNTER — Ambulatory Visit: Payer: Medicare Other | Admitting: Cardiology

## 2019-07-12 ENCOUNTER — Other Ambulatory Visit: Payer: Self-pay

## 2019-07-12 ENCOUNTER — Telehealth (INDEPENDENT_AMBULATORY_CARE_PROVIDER_SITE_OTHER): Payer: Medicare Other | Admitting: Cardiology

## 2019-07-12 VITALS — BP 135/82 | HR 61 | Ht 72.0 in | Wt 231.0 lb

## 2019-07-12 DIAGNOSIS — I77810 Thoracic aortic ectasia: Secondary | ICD-10-CM

## 2019-07-12 DIAGNOSIS — I2510 Atherosclerotic heart disease of native coronary artery without angina pectoris: Secondary | ICD-10-CM | POA: Diagnosis not present

## 2019-07-12 DIAGNOSIS — I700 Atherosclerosis of aorta: Secondary | ICD-10-CM | POA: Diagnosis not present

## 2019-07-12 DIAGNOSIS — I2584 Coronary atherosclerosis due to calcified coronary lesion: Secondary | ICD-10-CM

## 2019-07-12 NOTE — Patient Instructions (Addendum)
Medication Instructions:  The current medical regimen is effective;  continue present plan and medications.  *If you need a refill on your cardiac medications before your next appointment, please call your pharmacy*  Testing/Procedures: Please have MRI as previously requested by Dr Marlou Porch.  You will be contacted with instructions and to be scheduled for this appointment.  Follow-Up: At [facility], you and your health needs are our priority.  As part of our continuing mission to provide you with exceptional heart care, we have created designated Provider Care Teams.  These Care Teams include your primary Cardiologist (physician) and Advanced Practice Providers (APPs -  Physician Assistants and Nurse Practitioners) who all work together to provide you with the care you need, when you need it.  Your next appointment:   In 1 year  The format for your next appointment:   In person  Provider:   Dr Candee Furbish  Thank you for choosing [facility]!!

## 2019-07-12 NOTE — Progress Notes (Signed)
Virtual Visit via Telephone Note   This visit type was conducted due to national recommendations for restrictions regarding the COVID-19 Pandemic (e.g. social distancing) in an effort to limit this patient's exposure and mitigate transmission in our community.  Due to his co-morbid illnesses, this patient is at least at moderate risk for complications without adequate follow up.  This format is felt to be most appropriate for this patient at this time.  The patient did not have access to video technology/had technical difficulties with video requiring transitioning to audio format only (telephone).  All issues noted in this document were discussed and addressed.  No physical exam could be performed with this format.  Please refer to the patient's chart for his  consent to telehealth for [facility].  Attempted video but unsuccessful  Date:  07/12/2019   ID:  [name], DOB 06/23/1946, MRN [id number]  Patient Location: Home Provider Location: Home  PCP:  Maximiano Coss, NP  Cardiologist:  Candee Furbish, MD  Electrophysiologist:  None   Evaluation Performed:  Follow-Up Visit  Chief Complaint:  Discuss test results.   History of Present Illness:    Christian Parsons is a 73 y.o. male with bipolar disorder, aortic atherosclerosis, family history of angina (father), dilated ascending aorta (85mm on ECHO) with decreased exercise tolerance, mild chest pain during prior hiking. [place] behind group.   Has lost weight from 285.Down another 5 pounds. 229 now.   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  Had surgery, Cipro, lengthy recovery.  Weight gain.  Lipid lab was November 28, 2018 ordered by Doctor Tamala Julian, [facility]. My cholesterol was 121, triglycerides 90, LDL 70, HDL 33 [address] approximately 5.5 miles and did well.  He wonders if his symptoms were related to heat and humidity.  Once  again, he knew about his aortic atherosclerosis he states for 20 years.  In the past, his lipids were higher secondary to worse lifestyle.  Certainly now, over the last few years he has done very well with diet modification and exercise.  He sent me a patient message on 06/29/2019: Thank you very much for the quick report on the cardiac stress test -- quicker than I thought possible!  I have three questions.    Does the dilation of the ascending aorta explain the pain in my upper right  chest?  What are the best things I can do to slow or reverse this process?  My experience since my complaint seems consistent with simply avoiding hiking in hot and humid weather.  I have been hiking virtually every day for more than two months, from 2 to 4 hours on natural trails, with modest or moderate elevation changes.  As I have gotten stronger and the weather cooler, the symptoms have significantly subsided.  Can you tell whether the calcification you mentioned is stable or worsening?  The calcification is, I believe,  twenty or more years old, before I made a significant change in lifestyle.  Do you want to do any further tests?  Thank you again.  I hope I have not been too difficult a patient.  We answered all the questions above.  He is willing to proceed with MRI.  Answered several questions about statin use.  Wanted to continue with diet and exercise at this time.  Expressed the value behind plaque stabilization especially when atherosclerosis is detected.   The patient  does not have symptoms concerning for COVID-19 infection (fever, chills, cough, or new shortness of breath).    Past Medical History:  Diagnosis Date  . Anxiety   . Bipolar disorder ([place])    H/O  . BPH with urinary obstruction    s/p TURP  . Chronic kidney disease   . Chronic renal insufficiency, stage 2 (mild)    secondary to lithium toxicity; baseline creatinine 1.3-1.6  . Coronary artery disease   . Depression   .  Hypertension    Past Surgical History:  Procedure Laterality Date  . HERNIA REPAIR    . PROSTATE SURGERY    . TRANSURETHRAL RESECTION OF PROSTATE       Current Meds  Medication Sig  . aspirin EC 81 MG tablet Take 81 mg by mouth daily.  Marland Kitchen LAMOTRIGINE PO Take 225 mg by mouth daily.  Marland Kitchen LORazepam (ATIVAN) 0.5 MG tablet Take 0.5 mg by mouth as needed.  . rizatriptan (MAXALT) 10 MG tablet Take 1 tablet (10 mg total) by mouth as needed for migraine. May repeat in 2 hours if needed  . tamsulosin (FLOMAX) 0.4 MG CAPS capsule Take 0.4 mg by mouth daily.     Allergies:   Patient has no known allergies.   Social History   Tobacco Use  . Smoking status: Never Smoker  . Smokeless tobacco: Never Used  Substance Use Topics  . Alcohol use: Yes    Alcohol/week: 2.0 standard drinks    Types: 2 Standard drinks or equivalent per week    Comment: Occasional  . Drug use: No     Family Hx: The patient's family history includes Arthritis in his sister; Cancer in his mother; Dementia in his father.  ROS:   Please see the history of present illness.    Denies any fevers chills nausea vomiting syncope bleeding All other systems reviewed and are negative.   Prior CV studies:   The following studies were reviewed today:  Nuclear stress test 06/23/2019:  Nuclear stress EF: 57%.  There was no ST segment deviation noted during stress.  Blood pressure demonstrated a normal response to exercise.  The study is normal.  This is a low risk study.  The left ventricular ejection fraction is normal (55-65%).   Normal exercise nuclear stress test with no evidence for prior infarct or ischemia.  Excellent exercise capacity. Normal BP response to exercise. Normal LVEF.   Labs/Other Tests and Data Reviewed:    EKG:  An ECG dated 05/04/2019 was personally reviewed today and demonstrated:  Sinus rhythm right bundle branch block  Recent Labs: 06/04/2019: ALT 21; BUN 24; Creatinine, Ser 1.46;  Hemoglobin 15.3; Platelets 153; Potassium 4.5; Sodium 144   Recent Lipid Panel Lab Results  Component Value Date/Time   CHOL 137 06/04/2019 02:43 PM   TRIG 201 (H) 06/04/2019 02:43 PM   HDL 32 (L) 06/04/2019 02:43 PM   CHOLHDL 4.3 06/04/2019 02:43 PM   CHOLHDL 4.5 07/25/2016 08:06 AM   LDLCALC 71 06/04/2019 02:43 PM    Wt Readings from Last 3 Encounters:  07/12/19 231 lb (104.8 kg)  06/23/19 235 lb (106.6 kg)  06/04/19 235 lb (106.6 kg)     Objective:    Vital Signs:  BP 135/82   Pulse 61   Ht 6' (1.829 m)   Wt 231 lb (104.8 kg)   BMI 31.33 kg/m    VITAL SIGNS:  reviewed able to complete full sentences without difficulty.  Alert  ASSESSMENT & PLAN:  Coronary artery atherosclerosis/calcification -Unable to perform cardiac CT secondary to creatinine elevation of 1.8. -Instead performed pharmacologic stress test which showed no evidence of ischemia.  Low risk.  Continue with aggressive risk factor modification. -If symptoms were to deteriorate, would proceed with hydration and cardiac catheterization diagnostic. -Once again encouraged statin use such as Crestor.  He would like to continue with nebulized.  Expressed the importance of plaque stabilization and minimization of heart attack risks.  Dilated aortic root -Cardiac MRI was recommended so that no contrast utilized.  This was ordered on 06/09/2019.  It appears that it has not been performed.  He wanted to discuss for having this performed.  Measurement of 45 mm on echocardiogram.  I would like to verify on MRI of aorta.  He is willing to proceed.  If it correlates well with the echocardiogram, it would be reasonable to bounce back and forth between echo and MRI to continue to monitor his aortic root.  Elevated blood pressure -We will continue to monitor this.  He did purchase an Omron blood pressure cuff.  Optimally with his dilated aortic root, I would like to see his blood pressures in the 120/80 range.  Discussed with  him the potential for beta-blocker for shear force reduction.  At this time he would like to continue with diet and exercise.  He has lost another 9 pounds.  COVID-19 Education: The signs and symptoms of COVID-19 were discussed with the patient and how to seek care for testing (follow up with PCP or arrange E-visit).  The importance of social distancing was discussed today.  Time:   Today, I have spent 21 minutes with the patient with telehealth technology discussing the above problems, review of medical records.     Medication Adjustments/Labs and Tests Ordered: Current medicines are reviewed at length with the patient today.  Concerns regarding medicines are outlined above.   Tests Ordered: No orders of the defined types were placed in this encounter.   Medication Changes: No orders of the defined types were placed in this encounter.   Follow Up:  In Person in 1 year(s)  Signed, Candee Furbish, MD  07/12/2019 9:10 AM    Guadalupe Guerra

## 2019-07-14 ENCOUNTER — Other Ambulatory Visit: Payer: Self-pay | Admitting: *Deleted

## 2019-07-14 DIAGNOSIS — I77810 Thoracic aortic ectasia: Secondary | ICD-10-CM

## 2019-07-20 ENCOUNTER — Telehealth: Payer: Self-pay | Admitting: Cardiology

## 2019-07-20 NOTE — Telephone Encounter (Signed)
Pt has been scheduled for Friday Nov 13 at 3pm.  Arrive at 2:40 pm 1st floor Radiology at [facility].

## 2019-07-20 NOTE — Telephone Encounter (Signed)
Called and spoke with pt advising him Dr Marlou Porch specifically ordered his testing WITHOUT CONTRAST AND WITHOUT GADOLINIUM.  I reviewed the date, time and location of the testing as well.  Pt states understanding.

## 2019-07-20 NOTE — Telephone Encounter (Signed)
New message     Called pt to get info I need regarding scheduling MR angiogram chest.  Patient want to talk to nurse to make sure this test is without contrast.  Please call

## 2019-07-22 ENCOUNTER — Encounter: Payer: Self-pay | Admitting: *Deleted

## 2019-07-22 ENCOUNTER — Telehealth: Payer: Self-pay | Admitting: *Deleted

## 2019-07-22 NOTE — Telephone Encounter (Signed)
Called patient regarding Cardiac MRI appointment schedule 07/30/19 at 3:00 pm at [facility]----arrival time is 2:15pm at the 1st floor admissions office--voice mail is full----will mail information to patient and it is also in My Chart

## 2019-07-30 ENCOUNTER — Other Ambulatory Visit: Payer: Self-pay | Admitting: Cardiology

## 2019-07-30 ENCOUNTER — Ambulatory Visit (HOSPITAL_COMMUNITY)
Admission: RE | Admit: 2019-07-30 | Discharge: 2019-07-30 | Disposition: A | Discharge status: Home / Self Care | Payer: Medicare Other | Source: Ambulatory Visit | Attending: Cardiology | Admitting: Cardiology

## 2019-07-30 ENCOUNTER — Other Ambulatory Visit: Payer: Self-pay

## 2019-07-30 DIAGNOSIS — I77810 Thoracic aortic ectasia: Secondary | ICD-10-CM | POA: Diagnosis present

## 2019-08-23 ENCOUNTER — Encounter: Payer: Self-pay | Admitting: Registered Nurse

## 2019-08-26 ENCOUNTER — Other Ambulatory Visit: Payer: Self-pay

## 2019-08-26 ENCOUNTER — Telehealth: Payer: Self-pay

## 2019-08-26 ENCOUNTER — Ambulatory Visit: Payer: Medicare Other

## 2019-08-26 NOTE — Telephone Encounter (Signed)
Pt to office for Nurse Visit to obtain Shingrix.  Says he was told on phone we provide it.  Apologized for wait and misunderstanding.  Have specific parameters for Shingrix and are unable to provide it to him.  Referred to pharmacy.  Pt upset he waited for nurse visit.  Apologized again and explained that it would not be covered here.

## 2019-09-06 ENCOUNTER — Other Ambulatory Visit: Payer: Self-pay | Admitting: Urology

## 2019-09-06 DIAGNOSIS — D49511 Neoplasm of unspecified behavior of right kidney: Secondary | ICD-10-CM

## 2019-09-06 DIAGNOSIS — D4101 Neoplasm of uncertain behavior of right kidney: Secondary | ICD-10-CM

## 2019-09-28 ENCOUNTER — Other Ambulatory Visit: Payer: Self-pay

## 2019-09-28 ENCOUNTER — Ambulatory Visit (INDEPENDENT_AMBULATORY_CARE_PROVIDER_SITE_OTHER): Payer: Medicare Other | Admitting: Registered Nurse

## 2019-09-28 ENCOUNTER — Encounter: Payer: Self-pay | Admitting: Registered Nurse

## 2019-09-28 VITALS — BP 144/92 | HR 63 | Temp 98.3°F | Resp 16 | Ht 72.2 in | Wt 235.0 lb

## 2019-09-28 DIAGNOSIS — Z1329 Encounter for screening for other suspected endocrine disorder: Secondary | ICD-10-CM

## 2019-09-28 DIAGNOSIS — E781 Pure hyperglyceridemia: Secondary | ICD-10-CM

## 2019-09-28 DIAGNOSIS — Z130 Encounter for screening for diseases of the blood and blood-forming organs and certain disorders involving the immune mechanism: Secondary | ICD-10-CM

## 2019-09-28 DIAGNOSIS — Z13220 Encounter for screening for lipoid disorders: Secondary | ICD-10-CM

## 2019-09-28 DIAGNOSIS — Z13228 Encounter for screening for other metabolic disorders: Secondary | ICD-10-CM | POA: Diagnosis not present

## 2019-09-28 LAB — POCT URINALYSIS DIP (MANUAL ENTRY)
Bilirubin, UA: NEGATIVE
Blood, UA: NEGATIVE
Glucose, UA: NEGATIVE mg/dL
Ketones, POC UA: NEGATIVE mg/dL
Leukocytes, UA: NEGATIVE
Nitrite, UA: NEGATIVE
Protein Ur, POC: NEGATIVE mg/dL
Spec Grav, UA: 1.015 (ref 1.010–1.025)
Urobilinogen, UA: 0.2 E.U./dL
pH, UA: 6.5 (ref 5.0–8.0)

## 2019-09-28 NOTE — Patient Instructions (Signed)
° ° ° °  If you have lab work done today you will be contacted with your lab results within the next 2 weeks.  If you have not heard from us then please contact us. The fastest way to get your results is to register for My Chart. ° ° °IF you received an x-ray today, you will receive an invoice from [facility]. Please contact [facility] at [phone] with questions or concerns regarding your invoice.  ° °IF you received labwork today, you will receive an invoice from [facility]. Please contact [facility] at [phone] with questions or concerns regarding your invoice.  ° °Our billing staff will not be able to assist you with questions regarding bills from these companies. ° °You will be contacted with the lab results as soon as they are available. The fastest way to get your results is to activate your My Chart account. Instructions are located on the last page of this paperwork. If you have not heard from us regarding the results in 2 weeks, please contact this office. °  ° ° ° °

## 2019-09-29 ENCOUNTER — Encounter: Payer: Self-pay | Admitting: Registered Nurse

## 2019-09-29 LAB — COMPREHENSIVE METABOLIC PANEL
ALT: 25 IU/L (ref 0–44)
AST: 20 IU/L (ref 0–40)
Albumin/Globulin Ratio: 1.5 (ref 1.2–2.2)
Albumin: 4.1 g/dL (ref 3.7–4.7)
Alkaline Phosphatase: 85 IU/L (ref 39–117)
BUN/Creatinine Ratio: 15 (ref 10–24)
BUN: 23 mg/dL (ref 8–27)
Bilirubin Total: 0.6 mg/dL (ref 0.0–1.2)
CO2: 24 mmol/L (ref 20–29)
Calcium: 9.5 mg/dL (ref 8.6–10.2)
Chloride: 104 mmol/L (ref 96–106)
Creatinine, Ser: 1.53 mg/dL — ABNORMAL HIGH (ref 0.76–1.27)
GFR calc Af Amer: 52 mL/min/{1.73_m2} — ABNORMAL LOW (ref >59)
GFR calc non Af Amer: 45 mL/min/{1.73_m2} — ABNORMAL LOW (ref >59)
Globulin, Total: 2.7 g/dL (ref 1.5–4.5)
Glucose: 92 mg/dL (ref 65–99)
Potassium: 4.5 mmol/L (ref 3.5–5.2)
Sodium: 141 mmol/L (ref 134–144)
Total Protein: 6.8 g/dL (ref 6.0–8.5)

## 2019-09-29 LAB — LIPID PANEL
Chol/HDL Ratio: 4.1 ratio (ref 0.0–5.0)
Cholesterol, Total: 140 mg/dL (ref 100–199)
HDL: 34 mg/dL — ABNORMAL LOW (ref >39)
LDL Chol Calc (NIH): 83 mg/dL (ref 0–99)
Triglycerides: 127 mg/dL (ref 0–149)
VLDL Cholesterol Cal: 23 mg/dL (ref 5–40)

## 2019-09-29 NOTE — Progress Notes (Signed)
Labs steady, No concerns Letter sent via [place], NP

## 2019-09-30 ENCOUNTER — Encounter: Payer: Self-pay | Admitting: Registered Nurse

## 2019-09-30 NOTE — Progress Notes (Signed)
Established Patient Office Visit  Subjective:  Patient ID: Christian Parsons, male    DOB: 1945/10/05  Age: 74 y.o. MRN: [id number]  CC:  Chief Complaint  Patient presents with  . Hypertension    follow up and check labs    HPI Christian Parsons presents for HTN follow up and labs  HTN mildly elevated today, but no symptoms. No issues with medications.  Will check CMP, lipids, and urinalysis today.  Christian Parsons feels well overall. Still very active.   We spent some time talking about the COVID-19 vaccine and when it would be available for him.  Past Medical History:  Diagnosis Date  . Anxiety   . Bipolar disorder ([place])    H/O  . BPH with urinary obstruction    s/p TURP  . Chronic kidney disease   . Chronic renal insufficiency, stage 2 (mild)    secondary to lithium toxicity; baseline creatinine 1.3-1.6  . Coronary artery disease   . Depression   . Hypertension     Past Surgical History:  Procedure Laterality Date  . HERNIA REPAIR    . PROSTATE SURGERY    . TRANSURETHRAL RESECTION OF PROSTATE      Family History  Problem Relation Age of Onset  . Cancer Mother        breast cancer  . Dementia Father   . Arthritis Sister        hip OA s/p hip replacement    Social History   Socioeconomic History  . Marital status: Married    Spouse name: Not on file  . Number of children: 1  . Years of education: 16+  . Highest education level: Professional school degree (e.g., MD, DDS, DVM, JD)  Occupational History  . Occupation: retired    Comment: Music therapist  Tobacco Use  . Smoking status: Never Smoker  . Smokeless tobacco: Never Used  Substance and Sexual Activity  . Alcohol use: Yes    Alcohol/week: 2.0 standard drinks    Types: 2 Standard drinks or equivalent per week    Comment: Occasional  . Drug use: No  . Sexual activity: Yes    Partners: Female    Birth control/protection: Post-menopausal  Other Topics Concern  . Not on file  Social History  Narrative   Marital status: married x 48 years      Children: 1 daughter (3yo Professor Jarquez Mcknight), no grandchildren.       Lives: with wife/[name]      Employment: retired at age 33 years of life; [employer] x 12 years      Tobacco: never      Alcohol: rare in 2018; special occasions only      Exercise: walks 4-5 days per week also hikes 4 days per week 1-7 hours per day.  Swimming at [employer].       Advanced Directives; YES; full code but no prolonged measures.      ADLs: independent with ADLs in 2018; drives.   Social Determinants of Health   Financial Resource Strain:   . Difficulty of Paying Living Expenses: Not on file  Food Insecurity:   . Worried About Charity fundraiser in the Last Year: Not on file  . Ran Out of Food in the Last Year: Not on file  Transportation Needs:   . Lack of Transportation (Medical): Not on file  . Lack of Transportation (Non-Medical): Not on file  Physical Activity:   . Days of  Exercise per Week: Not on file  . Minutes of Exercise per Session: Not on file  Stress:   . Feeling of Stress : Not on file  Social Connections:   . Frequency of Communication with Friends and Family: Not on file  . Frequency of Social Gatherings with Friends and Family: Not on file  . Attends Religious Services: Not on file  . Active Member of Clubs or Organizations: Not on file  . Attends Archivist Meetings: Not on file  . Marital Status: Not on file  Intimate Partner Violence:   . Fear of Current or Ex-Partner: Not on file  . Emotionally Abused: Not on file  . Physically Abused: Not on file  . Sexually Abused: Not on file    Outpatient Medications Prior to Visit  Medication Sig Dispense Refill  . aspirin EC 81 MG tablet Take 81 mg by mouth daily.    Marland Kitchen LAMOTRIGINE PO Take 200 mg by mouth daily.    Marland Kitchen LORazepam (ATIVAN) 0.5 MG tablet Take 0.5 mg by mouth as needed.    . rizatriptan (MAXALT) 10 MG tablet Take 1 tablet (10 mg total)  by mouth as needed for migraine. May repeat in 2 hours if needed 10 tablet 3  . FLUZONE HIGH-DOSE QUADRIVALENT 0.7 ML SUSY TO BE ADMINISTERED BY PHARMACIST FOR IMMUNIZATION    . LAMOTRIGINE PO Take 225 mg by mouth daily.    . tamsulosin (FLOMAX) 0.4 MG CAPS capsule Take 0.4 mg by mouth daily.     No facility-administered medications prior to visit.    No Known Allergies  ROS Review of Systems  Constitutional: Negative.   HENT: Negative.   Eyes: Negative.   Respiratory: Negative.   Cardiovascular: Negative.   Gastrointestinal: Negative.   Endocrine: Negative.   Genitourinary: Negative.   Musculoskeletal: Negative.   Skin: Negative.   Allergic/Immunologic: Negative.   Neurological: Negative.   Hematological: Negative.   Psychiatric/Behavioral: Negative.   All other systems reviewed and are negative.     Objective:    Physical Exam  Constitutional: He is oriented to person, place, and time. He appears well-developed and well-nourished. No distress.  Cardiovascular: Normal rate and regular rhythm.  Pulmonary/Chest: Effort normal. No respiratory distress.  Neurological: He is alert and oriented to person, place, and time.  Skin: Skin is warm and dry. No rash noted. He is not diaphoretic. No erythema. No pallor.  Psychiatric: He has a normal mood and affect. His behavior is normal. Judgment and thought content normal.  Nursing note and vitals reviewed.   BP (!) 144/92   Pulse 63   Temp 98.3 F (36.8 C) (Oral)   Resp 16   Ht 6' 0.2" (1.834 m) Comment: per pt  Wt 235 lb (106.6 kg)   SpO2 98%   BMI 31.70 kg/m  Wt Readings from Last 3 Encounters:  09/28/19 235 lb (106.6 kg)  07/12/19 231 lb (104.8 kg)  06/23/19 235 lb (106.6 kg)     There are no preventive care reminders to display for this patient.  There are no preventive care reminders to display for this patient.  Lab Results  Component Value Date   TSH 1.006 08/25/2015   Lab Results  Component Value Date    WBC 5.4 06/04/2019   HGB 15.3 06/04/2019   HCT 44.4 06/04/2019   MCV 92 06/04/2019   PLT 153 06/04/2019   Lab Results  Component Value Date   NA 141 09/28/2019   K 4.5 09/28/2019  CO2 24 09/28/2019   GLUCOSE 92 09/28/2019   BUN 23 09/28/2019   CREATININE 1.53 (H) 09/28/2019   BILITOT 0.6 09/28/2019   ALKPHOS 85 09/28/2019   AST 20 09/28/2019   ALT 25 09/28/2019   PROT 6.8 09/28/2019   ALBUMIN 4.1 09/28/2019   CALCIUM 9.5 09/28/2019   Lab Results  Component Value Date   CHOL 140 09/28/2019   Lab Results  Component Value Date   HDL 34 (L) 09/28/2019   Lab Results  Component Value Date   LDLCALC 83 09/28/2019   Lab Results  Component Value Date   TRIG 127 09/28/2019   Lab Results  Component Value Date   CHOLHDL 4.1 09/28/2019   Lab Results  Component Value Date   HGBA1C 5.4 08/25/2015      Assessment & Plan:   Problem List Items Addressed This Visit    None    Visit Diagnoses    Screening for endocrine, metabolic and immunity disorder    -  Primary   Relevant Orders   Comprehensive metabolic panel (Completed)   POCT urinalysis dipstick (Completed)   Lipid screening       Hypertriglyceridemia       Relevant Orders   Lipid Panel      No orders of the defined types were placed in this encounter.   Follow-up: Return in about 6 months (around 03/27/2020) for lipids and BP check, med refill.   PLAN  Labs drawn, if no concern, follow up in 5 mo for BP check, labs, med check  We discussed the COVID vaccine and it's likely upcoming availability to him - we discussed that [facility] is trying to coordinate communication with patients who qualify per [place] vaccine distrubution guidelines  Patient encouraged to call clinic with any questions, comments, or concerns.  Maximiano Coss, NP

## 2019-10-12 ENCOUNTER — Encounter: Payer: Self-pay | Admitting: Registered Nurse

## 2019-10-15 ENCOUNTER — Ambulatory Visit: Payer: Medicare Other

## 2019-10-23 ENCOUNTER — Ambulatory Visit: Payer: Medicare Other | Attending: Internal Medicine

## 2019-10-23 DIAGNOSIS — Z23 Encounter for immunization: Secondary | ICD-10-CM | POA: Insufficient documentation

## 2019-10-23 NOTE — Progress Notes (Signed)
   Covid-19 Vaccination Clinic  Name:  Christian Parsons    MRN: [id number] DOB: Sep 24, 1945  10/23/2019  Mr. Christian Parsons was observed post Covid-19 immunization for 15 minutes without incidence. He was provided with Vaccine Information Sheet and instruction to access the V-Safe system.   Mr. Christian Parsons was instructed to call 911 with any severe reactions post vaccine: Marland Kitchen Difficulty breathing  . Swelling of your face and throat  . A fast heartbeat  . A bad rash all over your body  . Dizziness and weakness    Immunizations Administered    Name Date Dose VIS Date Route   Pfizer COVID-19 Vaccine 10/23/2019  5:42 PM 0.3 mL 08/27/2019 Intramuscular   Manufacturer: [place]   Lot: [id number]   [place]: [id number]

## 2019-11-01 ENCOUNTER — Ambulatory Visit: Payer: Medicare Other

## 2019-11-09 ENCOUNTER — Other Ambulatory Visit: Payer: Medicare Other

## 2019-11-17 ENCOUNTER — Ambulatory Visit: Payer: Medicare Other | Attending: Internal Medicine

## 2019-11-17 DIAGNOSIS — Z23 Encounter for immunization: Secondary | ICD-10-CM | POA: Insufficient documentation

## 2019-11-17 NOTE — Progress Notes (Signed)
   Covid-19 Vaccination Clinic  Name:  Christian Parsons    MRN: [id number] DOB: 1946/05/15  11/17/2019  [name] was observed post Covid-19 immunization for 15 minutes without incident. He was provided with Vaccine Information Sheet and instruction to access the V-Safe system.   [name] was instructed to call 911 with any severe reactions post vaccine: Marland Kitchen Difficulty breathing  . Swelling of face and throat  . A fast heartbeat  . A bad rash all over body  . Dizziness and weakness   Immunizations Administered    Name Date Dose VIS Date Route   Pfizer COVID-19 Vaccine 11/17/2019  2:47 PM 0.3 mL 08/27/2019 Intramuscular   Manufacturer: [place]   Lot: [id number]   [place]: [id number]

## 2019-12-12 ENCOUNTER — Other Ambulatory Visit: Payer: Self-pay | Admitting: Registered Nurse

## 2019-12-12 DIAGNOSIS — G43109 Migraine with aura, not intractable, without status migrainosus: Secondary | ICD-10-CM

## 2020-01-15 ENCOUNTER — Other Ambulatory Visit: Payer: Medicare Other

## 2020-03-06 ENCOUNTER — Ambulatory Visit
Admission: RE | Admit: 2020-03-06 | Discharge: 2020-03-06 | Disposition: A | Discharge status: Home / Self Care | Payer: Medicare Other | Source: Ambulatory Visit | Attending: Urology | Admitting: Urology

## 2020-03-06 ENCOUNTER — Other Ambulatory Visit: Payer: Self-pay

## 2020-03-06 DIAGNOSIS — D4101 Neoplasm of uncertain behavior of right kidney: Secondary | ICD-10-CM

## 2020-03-06 MED ORDER — GADOBENATE DIMEGLUMINE 529 MG/ML IV SOLN
14.0000 mL | Freq: Once | INTRAVENOUS | Status: AC | PRN
  Administered 2020-03-06: 14 mL via INTRAVENOUS

## 2020-03-27 ENCOUNTER — Encounter: Payer: Self-pay | Admitting: Registered Nurse

## 2020-03-27 ENCOUNTER — Ambulatory Visit (INDEPENDENT_AMBULATORY_CARE_PROVIDER_SITE_OTHER): Payer: Medicare Other | Admitting: Registered Nurse

## 2020-03-27 ENCOUNTER — Other Ambulatory Visit: Payer: Self-pay

## 2020-03-27 VITALS — BP 109/69 | HR 74 | Temp 98.2°F | Ht 72.2 in | Wt 203.0 lb

## 2020-03-27 DIAGNOSIS — N200 Calculus of kidney: Secondary | ICD-10-CM

## 2020-03-27 DIAGNOSIS — E781 Pure hyperglyceridemia: Secondary | ICD-10-CM

## 2020-03-27 LAB — POCT URINALYSIS DIP (MANUAL ENTRY)
Bilirubin, UA: NEGATIVE
Blood, UA: NEGATIVE
Glucose, UA: NEGATIVE mg/dL
Ketones, POC UA: NEGATIVE mg/dL
Leukocytes, UA: NEGATIVE
Nitrite, UA: NEGATIVE
Protein Ur, POC: NEGATIVE mg/dL
Spec Grav, UA: 1.015 (ref 1.010–1.025)
Urobilinogen, UA: 0.2 E.U./dL
pH, UA: 5.5 (ref 5.0–8.0)

## 2020-03-27 NOTE — Progress Notes (Signed)
Established Patient Office Visit  Subjective:  Patient ID: Christian Parsons, male    DOB: 1946-06-25  Age: 74 y.o. MRN: [id number]  CC:  Chief Complaint  Patient presents with  . Hematuria    2 days ago   . kdney stones    passed 2 yesterday   . Weight Loss    30 lbs in 2 months on NOOM diet    HPI [name] presents for follow up  Recently had MRI of kidneys with [facility]. Studies showed no changes in his kidneys: IMPRESSION: 1. 2.1 cm cavernous hemangioma in the dome of liver as seen on previous abdomen and pelvis CT of 05/12/2014. 2. 3.7 cm simple cyst lower pole right kidney with an 8 mm hemorrhagic/proteinaceous Bosniak II cyst in the interpolar left kidney. 3. No characteristics/typical signs of nephroblastomatosis on today's study. Innumerable tiny T2 hyperintense lesions in both kidneys. Many of these lesions show no appreciable enhancement after IV contrast administration consistent with benign cysts.  This was in comparison to CT study on 05/12/2014. There is an additional CT from 10/20/18 that was not compared.  He states that not long after the MRI wwo contrast he noted one episode of hematuria and two stone like objects in the toilet after voiding. These were painful to pass. Since, he hasn't had hematuria and no dysuria. Feeling fine at this time.   Does note intentional weight loss of 20lbs in previous 8-10 weeks. Has been using noom - admits it's a strict diet but it's working well for him. Feeling good, is confident this will have a positive impact on his bp and lipids.  HTN: No complaints of cv symptoms. Feeling well. No changes to lifestyle - still very active, and has been dieting as stated above.  Past Medical History:  Diagnosis Date  . Anxiety   . Bipolar disorder ([place])    H/O  . BPH with urinary obstruction    s/p TURP  . Chronic kidney disease   . Chronic renal insufficiency, stage 2 (mild)    secondary to lithium toxicity; baseline  creatinine 1.3-1.6  . Coronary artery disease   . Depression   . Hypertension     Past Surgical History:  Procedure Laterality Date  . HERNIA REPAIR    . PROSTATE SURGERY    . TRANSURETHRAL RESECTION OF PROSTATE      Family History  Problem Relation Age of Onset  . Cancer Mother        breast cancer  . Dementia Father   . Arthritis Sister        hip OA s/p hip replacement    Social History   Socioeconomic History  . Marital status: Married    Spouse name: Not on file  . Number of children: 1  . Years of education: 16+  . Highest education level: Professional school degree (e.g., MD, DDS, DVM, JD)  Occupational History  . Occupation: retired    Comment: Music therapist  Tobacco Use  . Smoking status: Never Smoker  . Smokeless tobacco: Never Used  Substance and Sexual Activity  . Alcohol use: Yes    Alcohol/week: 2.0 standard drinks    Types: 2 Standard drinks or equivalent per week    Comment: Occasional  . Drug use: No  . Sexual activity: Yes    Partners: Female    Birth control/protection: Post-menopausal  Other Topics Concern  . Not on file  Social History Narrative   Marital status: married x  48 years      Children: 1 daughter (49yo Professor Christian Parsons), no grandchildren.       Lives: with wife/[name]      Employment: retired at age 86 years of life; [place] x 12 years      Tobacco: never      Alcohol: rare in 2018; special occasions only      Exercise: walks 4-5 days per week also hikes 4 days per week 1-7 hours per day.  Swimming at [employer].       Advanced Directives; YES; full code but no prolonged measures.      ADLs: independent with ADLs in 2018; drives.   Social Determinants of Health   Financial Resource Strain:   . Difficulty of Paying Living Expenses:   Food Insecurity:   . Worried About Charity fundraiser in the Last Year:   . Arboriculturist in the Last Year:   Transportation Needs:   . Film/video editor  (Medical):   Marland Kitchen Lack of Transportation (Non-Medical):   Physical Activity:   . Days of Exercise per Week:   . Minutes of Exercise per Session:   Stress:   . Feeling of Stress :   Social Connections:   . Frequency of Communication with Friends and Family:   . Frequency of Social Gatherings with Friends and Family:   . Attends Religious Services:   . Active Member of Clubs or Organizations:   . Attends Archivist Meetings:   Marland Kitchen Marital Status:   Intimate Partner Violence:   . Fear of Current or Ex-Partner:   . Emotionally Abused:   Marland Kitchen Physically Abused:   . Sexually Abused:     Outpatient Medications Prior to Visit  Medication Sig Dispense Refill  . aspirin EC 81 MG tablet Take 81 mg by mouth daily.    Marland Kitchen LAMOTRIGINE PO Take 200 mg by mouth daily.    Marland Kitchen LORazepam (ATIVAN) 0.5 MG tablet Take 0.5 mg by mouth as needed.    . Melatonin 2.5 MG CAPS Take by mouth.    . rizatriptan (MAXALT) 10 MG tablet TAKE 1 TABLET BY MOUTH AS NEEDED FOR MIGRAINE. MAY REPEAT IN 2 HOURS IF NEEDED 10 tablet 3  . FLUZONE HIGH-DOSE QUADRIVALENT 0.7 ML SUSY TO BE ADMINISTERED BY PHARMACIST FOR IMMUNIZATION     No facility-administered medications prior to visit.    No Known Allergies  ROS Review of Systems Per hpi     Objective:    Physical Exam Constitutional:      General: He is not in acute distress.    Appearance: Normal appearance. He is normal weight. He is not ill-appearing, toxic-appearing or diaphoretic.  Cardiovascular:     Rate and Rhythm: Normal rate and regular rhythm.  Abdominal:     Tenderness: There is no right CVA tenderness or left CVA tenderness.  Skin:    General: Skin is warm and dry.     Capillary Refill: Capillary refill takes less than 2 seconds.  Neurological:     General: No focal deficit present.     Mental Status: He is alert and oriented to person, place, and time. Mental status is at baseline.  Psychiatric:        Mood and Affect: Mood normal.         Behavior: Behavior normal.        Thought Content: Thought content normal.        Judgment: Judgment normal.  BP 109/69   Pulse 74   Temp 98.2 F (36.8 C)   Ht 6' 0.2" (1.834 m)   Wt 203 lb (92.1 kg)   SpO2 95%   BMI 27.38 kg/m  Wt Readings from Last 3 Encounters:  03/27/20 203 lb (92.1 kg)  09/28/19 235 lb (106.6 kg)  07/12/19 231 lb (104.8 kg)     There are no preventive care reminders to display for this patient.  There are no preventive care reminders to display for this patient.  Lab Results  Component Value Date   TSH 1.006 08/25/2015   Lab Results  Component Value Date   WBC 5.4 06/04/2019   HGB 15.3 06/04/2019   HCT 44.4 06/04/2019   MCV 92 06/04/2019   PLT 153 06/04/2019   Lab Results  Component Value Date   NA 141 09/28/2019   K 4.5 09/28/2019   CO2 24 09/28/2019   GLUCOSE 92 09/28/2019   BUN 23 09/28/2019   CREATININE 1.53 (H) 09/28/2019   BILITOT 0.6 09/28/2019   ALKPHOS 85 09/28/2019   AST 20 09/28/2019   ALT 25 09/28/2019   PROT 6.8 09/28/2019   ALBUMIN 4.1 09/28/2019   CALCIUM 9.5 09/28/2019   Lab Results  Component Value Date   CHOL 140 09/28/2019   Lab Results  Component Value Date   HDL 34 (L) 09/28/2019   Lab Results  Component Value Date   LDLCALC 83 09/28/2019   Lab Results  Component Value Date   TRIG 127 09/28/2019   Lab Results  Component Value Date   CHOLHDL 4.1 09/28/2019   Lab Results  Component Value Date   HGBA1C 5.4 08/25/2015      Assessment & Plan:   Problem List Items Addressed This Visit    None    Visit Diagnoses    Kidney stones    -  Primary   Relevant Orders   POCT urinalysis dipstick (Completed)   CBC With Differential   Hypertriglyceridemia       Relevant Orders   Lipid Panel   CBC With Differential      No orders of the defined types were placed in this encounter.   Follow-up: No follow-ups on file.   PLAN  Will draw lipids and cbc to follow up on hypertriglyceridemia  and hematuria.  Weight loss is intentional. He will follow it's trend - it is worth keeping an eye on with his history of renal abnormalities. If further symptoms develop he will contact our office immediately.  Otherwise, no concerns. Continue good diet and frequent exercise as tolerated  Patient encouraged to call clinic with any questions, comments, or concerns.  Maximiano Coss, NP

## 2020-03-28 LAB — CBC WITH DIFFERENTIAL
Basophils Absolute: 0 10*3/uL (ref 0.0–0.2)
Basos: 1 %
EOS (ABSOLUTE): 0.1 10*3/uL (ref 0.0–0.4)
Eos: 2 %
Hematocrit: 40.6 % (ref 37.5–51.0)
Hemoglobin: 14.1 g/dL (ref 13.0–17.7)
Immature Grans (Abs): 0 10*3/uL (ref 0.0–0.1)
Immature Granulocytes: 0 %
Lymphocytes Absolute: 0.8 10*3/uL (ref 0.7–3.1)
Lymphs: 15 %
MCH: 32.3 pg (ref 26.6–33.0)
MCHC: 34.7 g/dL (ref 31.5–35.7)
MCV: 93 fL (ref 79–97)
Monocytes Absolute: 0.5 10*3/uL (ref 0.1–0.9)
Monocytes: 10 %
Neutrophils Absolute: 3.7 10*3/uL (ref 1.4–7.0)
Neutrophils: 72 %
RBC: 4.36 x10E6/uL (ref 4.14–5.80)
RDW: 13 % (ref 11.6–15.4)
WBC: 5.1 10*3/uL (ref 3.4–10.8)

## 2020-03-28 LAB — LIPID PANEL
Chol/HDL Ratio: 3.3 ratio (ref 0.0–5.0)
Cholesterol, Total: 124 mg/dL (ref 100–199)
HDL: 38 mg/dL — ABNORMAL LOW (ref >39)
LDL Chol Calc (NIH): 69 mg/dL (ref 0–99)
Triglycerides: 87 mg/dL (ref 0–149)
VLDL Cholesterol Cal: 17 mg/dL (ref 5–40)

## 2020-04-21 ENCOUNTER — Telehealth: Payer: Self-pay | Admitting: Registered Nurse

## 2020-04-21 NOTE — Telephone Encounter (Signed)
Patient is calling to see if Christian Parsons is available. Patient has a hang nail on the left toe and it affects his walking & hiking. Please advise CB- [phone]

## 2020-04-21 NOTE — Telephone Encounter (Signed)
Called to inform pt we cannot remove a hang nail we can help bandage but are limited otherwise. LMTCB

## 2020-05-18 ENCOUNTER — Ambulatory Visit: Payer: Medicare Other

## 2020-05-18 ENCOUNTER — Encounter: Payer: Self-pay | Admitting: Podiatry

## 2020-05-18 ENCOUNTER — Ambulatory Visit (INDEPENDENT_AMBULATORY_CARE_PROVIDER_SITE_OTHER): Payer: Medicare Other | Admitting: Podiatry

## 2020-05-18 ENCOUNTER — Other Ambulatory Visit: Payer: Self-pay

## 2020-05-18 DIAGNOSIS — Q828 Other specified congenital malformations of skin: Secondary | ICD-10-CM | POA: Diagnosis not present

## 2020-05-18 DIAGNOSIS — M779 Enthesopathy, unspecified: Secondary | ICD-10-CM

## 2020-05-23 ENCOUNTER — Encounter: Payer: Self-pay | Admitting: Podiatry

## 2020-05-23 NOTE — Progress Notes (Signed)
  Subjective:  Patient ID: Christian Parsons, male    DOB: 22-Aug-1946,  MRN: [id number]  Chief Complaint  Patient presents with  . Callouses    Patient presents today for painful callous tips of left 2nd and 3rd toes    74 y.o. male presents with the above complaint.  Patient presents with left second and third digit distal tip hyperkeratotic lesion that has been causing him some pain with ambulation.  Patient states that it hurts to walk on.  She states that he would like to have them debrided down as he is not able to do it himself.  He denies any other acute complaints.  He has not seek any other treatment options and has not seen anyone else prior to seeing me.   Review of Systems: Negative except as noted in the HPI. Denies N/V/F/Ch.  Past Medical History:  Diagnosis Date  . Anxiety   . Bipolar disorder ([place])    H/O  . BPH with urinary obstruction    s/p TURP  . Chronic kidney disease   . Chronic renal insufficiency, stage 2 (mild)    secondary to lithium toxicity; baseline creatinine 1.3-1.6  . Coronary artery disease   . Depression   . Hypertension     Current Outpatient Medications:  .  aspirin EC 81 MG tablet, Take 81 mg by mouth daily., Disp: , Rfl:  .  lamoTRIgine (LAMICTAL) 200 MG tablet, Take 200 mg by mouth daily., Disp: , Rfl:  .  LORazepam (ATIVAN) 0.5 MG tablet, Take 0.5 mg by mouth as needed., Disp: , Rfl:  .  Melatonin 2.5 MG CAPS, Take by mouth., Disp: , Rfl:  .  rizatriptan (MAXALT) 10 MG tablet, TAKE 1 TABLET BY MOUTH AS NEEDED FOR MIGRAINE. MAY REPEAT IN 2 HOURS IF NEEDED, Disp: 10 tablet, Rfl: 3  Social History   Tobacco Use  Smoking Status Never Smoker  Smokeless Tobacco Never Used    No Known Allergies Objective:  There were no vitals filed for this visit. There is no height or weight on file to calculate BMI. Constitutional Well developed. Well nourished.  Vascular Dorsalis pedis pulses palpable bilaterally. Posterior tibial pulses palpable  bilaterally. Capillary refill normal to all digits.  No cyanosis or clubbing noted. Pedal hair growth normal.  Neurologic Normal speech. Oriented to person, place, and time. Epicritic sensation to light touch grossly present bilaterally.  Dermatologic Nails well groomed and normal in appearance. No open wounds. No skin lesions.  Orthopedic:  Left second and third digit hyperkeratotic lesion.  Mild pain on palpation.  Upon debridement no pinpoint bleeding noted.  No underlying ulceration noted.   Radiographs: None Assessment:   1. Porokeratosis    Plan:  Patient was evaluated and treated and all questions answered.  Left second and third digit porokeratosis -I explained to patient the etiology of porokeratosis and various treatment options were extensively discussed.  Given the amount of pain that he is having I believe he will benefit from aggressive debridement of the lesion followed by toe protector to take the pressure off of it.  Patient agrees with the plan would like to proceed with the procedure. -Using chisel blade and handle, the hyperkeratotic lesion/porokeratosis were aggressively debrided down to healthy striated tissue.  No pinpoint bleeding noted.  No complication noted.  No follow-ups on file.

## 2020-06-13 ENCOUNTER — Other Ambulatory Visit: Payer: Self-pay

## 2020-06-13 ENCOUNTER — Ambulatory Visit (INDEPENDENT_AMBULATORY_CARE_PROVIDER_SITE_OTHER): Payer: Medicare Other | Admitting: Orthotics

## 2020-06-13 DIAGNOSIS — Q828 Other specified congenital malformations of skin: Secondary | ICD-10-CM

## 2020-06-13 NOTE — Progress Notes (Signed)
Cast for CMFO to address sheer forces/callus formation on forefoot; patient is avid hiker and averages 35 miles a week on trails.

## 2020-07-04 ENCOUNTER — Other Ambulatory Visit: Payer: Medicare Other | Admitting: Orthotics

## 2020-07-27 ENCOUNTER — Ambulatory Visit: Payer: Medicare Other | Admitting: Orthotics

## 2020-07-27 ENCOUNTER — Other Ambulatory Visit: Payer: Self-pay

## 2020-07-27 DIAGNOSIS — Q828 Other specified congenital malformations of skin: Secondary | ICD-10-CM

## 2020-07-27 NOTE — Progress Notes (Signed)
Patient came in today to pick up custom made foot orthotics.  The goals were accomplished and the patient reported no dissatisfaction with said orthotics.  Patient was advised of breakin period and how to report any issues. 

## 2020-08-03 ENCOUNTER — Other Ambulatory Visit: Payer: Self-pay

## 2020-08-03 ENCOUNTER — Ambulatory Visit: Payer: Medicare Other | Admitting: Orthotics

## 2020-08-03 DIAGNOSIS — Q828 Other specified congenital malformations of skin: Secondary | ICD-10-CM

## 2020-08-03 DIAGNOSIS — M2141 Flat foot [pes planus] (acquired), right foot: Secondary | ICD-10-CM

## 2020-08-03 DIAGNOSIS — M2142 Flat foot [pes planus] (acquired), left foot: Secondary | ICD-10-CM

## 2020-08-03 NOTE — Progress Notes (Signed)
He needs to see Dr. Posey Pronto before adjustments; made him f/o to offload ff to relieve painful callus; however, now he is complaining of posterior/medial ankle pain while hiking (possible PTTD as he is pes planovalgus L > R).   If he does have PTTD; then I need to remake f/o with 52mm deep heel cup, 4* RF varus post.

## 2020-08-15 ENCOUNTER — Encounter: Payer: Self-pay | Admitting: Cardiology

## 2020-08-15 ENCOUNTER — Ambulatory Visit (INDEPENDENT_AMBULATORY_CARE_PROVIDER_SITE_OTHER): Payer: Medicare Other | Admitting: Cardiology

## 2020-08-15 ENCOUNTER — Other Ambulatory Visit: Payer: Self-pay

## 2020-08-15 VITALS — BP 142/90 | HR 55 | Ht 72.0 in | Wt 205.0 lb

## 2020-08-15 DIAGNOSIS — I2584 Coronary atherosclerosis due to calcified coronary lesion: Secondary | ICD-10-CM

## 2020-08-15 DIAGNOSIS — I77810 Thoracic aortic ectasia: Secondary | ICD-10-CM

## 2020-08-15 DIAGNOSIS — I700 Atherosclerosis of aorta: Secondary | ICD-10-CM

## 2020-08-15 DIAGNOSIS — I2510 Atherosclerotic heart disease of native coronary artery without angina pectoris: Secondary | ICD-10-CM | POA: Diagnosis not present

## 2020-08-15 DIAGNOSIS — I4510 Unspecified right bundle-branch block: Secondary | ICD-10-CM

## 2020-08-15 NOTE — Progress Notes (Signed)
Cardiology Office Note:    Date:  08/15/2020   ID:  Christian Parsons, DOB 08/05/1946, MRN [id number]  PCP:  Maximiano Coss, NP  [facility] Cardiologist:  Candee Furbish, MD  [facility] Electrophysiologist:  None   Referring MD: Maximiano Coss, NP     History of Present Illness:    Christian Parsons is a 74 y.o. male here for the follow-up of chest pain.  Bipolar disorder Aortic atherosclerosis Family history of CAD/angina-father Dilated ascending aorta-45 mm on echocardiogram  Maximum weight 285, prior visit 229.  Enjoys hiking trails here locally.  Used to work as a Advertising account executive, Conservation officer, nature.  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  Has had some upper right chest discomfort at times.  This is not secondary to his aortic root dilatation.  Was performed on 07/30/2019 and showed 44 mm order.  Matches the previous echocardiogram finding.  Has lost over 30 pounds walking.  Enjoys.  Denies any fevers chills nausea vomiting syncope bleeding.  He does have an occasional pinpoint right-sided discomfort, musculoskeletal.  Past Medical History:  Diagnosis Date  . Anxiety   . Bipolar disorder ([place])    H/O  . BPH with urinary obstruction    s/p TURP  . Chronic kidney disease   . Chronic renal insufficiency, stage 2 (mild)    secondary to lithium toxicity; baseline creatinine 1.3-1.6  . Coronary artery disease   . Depression   . Hypertension     Past Surgical History:  Procedure Laterality Date  . HERNIA REPAIR    . PROSTATE SURGERY    . TRANSURETHRAL RESECTION OF PROSTATE      Current Medications: Current Meds  Medication Sig  . aspirin EC 81 MG tablet Take 81 mg by mouth daily.  Marland Kitchen lamoTRIgine (LAMICTAL) 200 MG tablet Take 200 mg by mouth daily.  Marland Kitchen LORazepam (ATIVAN) 0.5 MG tablet Take 0.5 mg by mouth as needed.  . rizatriptan (MAXALT) 10 MG tablet TAKE 1 TABLET BY MOUTH AS NEEDED FOR MIGRAINE. MAY REPEAT IN 2 HOURS IF NEEDED      Allergies:   Patient has no known allergies.   Social History   Socioeconomic History  . Marital status: Married    Spouse name: Not on file  . Number of children: 1  . Years of education: 16+  . Highest education level: Professional school degree (e.g., MD, DDS, DVM, JD)  Occupational History  . Occupation: retired    Comment: Music therapist  Tobacco Use  . Smoking status: Never Smoker  . Smokeless tobacco: Never Used  Substance and Sexual Activity  . Alcohol use: Yes    Alcohol/week: 2.0 standard drinks    Types: 2 Standard drinks or equivalent per week    Comment: Occasional  . Drug use: No  . Sexual activity: Yes    Partners: Female    Birth control/protection: Post-menopausal  Other Topics Concern  . Not on file  Social History Narrative   Marital status: married x 48 years      Children: 1 daughter (75yo Professor Jared Whorley), no grandchildren.       Lives: with wife/[name]      Employment: retired at age 73 years of life; [place] x 12 years      Tobacco: never      Alcohol: rare in 2018; special occasions only      Exercise: walks 4-5 days per week also hikes 4 days per week  1-7 hours per day.  Swimming at [employer].       Advanced Directives; YES; full code but no prolonged measures.      ADLs: independent with ADLs in 2018; drives.   Social Determinants of Health   Financial Resource Strain:   . Difficulty of Paying Living Expenses: Not on file  Food Insecurity:   . Worried About Charity fundraiser in the Last Year: Not on file  . Ran Out of Food in the Last Year: Not on file  Transportation Needs:   . Lack of Transportation (Medical): Not on file  . Lack of Transportation (Non-Medical): Not on file  Physical Activity:   . Days of Exercise per Week: Not on file  . Minutes of Exercise per Session: Not on file  Stress:   . Feeling of Stress : Not on file  Social Connections:   . Frequency of Communication with Friends and  Family: Not on file  . Frequency of Social Gatherings with Friends and Family: Not on file  . Attends Religious Services: Not on file  . Active Member of Clubs or Organizations: Not on file  . Attends Archivist Meetings: Not on file  . Marital Status: Not on file     Family History: The patient's family history includes Arthritis in his sister; Cancer in his mother; Dementia in his father.  ROS:   Please see the history of present illness.     All other systems reviewed and are negative.  EKGs/Labs/Other Studies Reviewed:    The following studies were reviewed today: As above  Nuclear stress test 06/23/2019:  Nuclear stress EF: 57%.  There was no ST segment deviation noted during stress.  Blood pressure demonstrated a normal response to exercise.  The study is normal.  This is a low risk study.  The left ventricular ejection fraction is normal (55-65%).  Normal exercise nuclear stress test with no evidence for prior infarct or ischemia.  Excellent exercise capacity. Normal BP response to exercise. Normal LVEF.  EKG:  EKG is  ordered today.  The ekg ordered today demonstrates sinus bradycardia 55 right bundle branch block first-degree AV block 210 ms  Recent Labs: 09/28/2019: ALT 25; BUN 23; Creatinine, Ser 1.53; Potassium 4.5; Sodium 141 03/27/2020: Hemoglobin 14.1  Recent Lipid Panel    Component Value Date/Time   CHOL 124 03/27/2020 1428   TRIG 87 03/27/2020 1428   HDL 38 (L) 03/27/2020 1428   CHOLHDL 3.3 03/27/2020 1428   CHOLHDL 4.5 07/25/2016 0806   VLDL 30 07/25/2016 0806   LDLCALC 69 03/27/2020 1428     Risk Assessment/Calculations:       Physical Exam:    VS:  BP (!) 142/90   Pulse (!) 55   Ht 6' (1.829 m)   Wt 205 lb (93 kg)   SpO2 98%   BMI 27.80 kg/m     Wt Readings from Last 3 Encounters:  08/15/20 205 lb (93 kg)  03/27/20 203 lb (92.1 kg)  09/28/19 235 lb (106.6 kg)     GEN:  Well nourished, well developed in no acute  distress HEENT: Normal NECK: No JVD; No carotid bruits LYMPHATICS: No lymphadenopathy CARDIAC: RRR, no murmurs, rubs, gallops RESPIRATORY:  Clear to auscultation without rales, wheezing or rhonchi  ABDOMEN: Soft, non-tender, non-distended MUSCULOSKELETAL:  No edema; No deformity  SKIN: Warm and dry NEUROLOGIC:  Alert and oriented x 3 PSYCHIATRIC:  Normal affect   ASSESSMENT:    1. Dilated aortic  root ([place])   2. Coronary artery calcification   3. Aortic atherosclerosis ([place])   4. Right bundle branch block    PLAN:    In order of problems listed above:  Dilated aortic root -44 mm on cardiac MRI 07/30/2019. -Repeat echocardiogram for monitoring. -Continue with BP control.  Aortic atherosclerosis/coronary artery atherosclerosis/calcification -Prior creatinine 1.8 therefore hesitant to utilize coronary CT scan for further evaluation.  Nuclear stress test as above was overall reassuring with no ischemia.  If symptoms were to progress, could consider cardiac catheterization for further evaluation. -Encouraged statin use such as Crestor in the past.  Elevated blood pressure -Continue to monitor with Omron blood pressure cuff.  Optimal range 120/80.  Continue with diet and exercise. BP was low just prior to cataract surgery.  Today he is 142/90.  He may very well require antihypertensives.  We need to be careful especially with his dilated aortic root.  Right bundle branch block -Stable, prior EKG reviewed.  No high risk symptoms such as syncope.  Continue the excellent hiking.  Shared Decision Making/Informed Consent        Medication Adjustments/Labs and Tests Ordered: Current medicines are reviewed at length with the patient today.  Concerns regarding medicines are outlined above.  Orders Placed This Encounter  Procedures  . EKG 12-Lead  . ECHOCARDIOGRAM COMPLETE   No orders of the defined types were placed in this encounter.   Patient Instructions  Medication  Instructions:  The current medical regimen is effective;  continue present plan and medications.  *If you need a refill on your cardiac medications before your next appointment, please call your pharmacy*  Testing/Procedures: Your physician has requested that you have an echocardiogram in 1 year. Echocardiography is a painless test that uses sound waves to create images of your heart. It provides your doctor with information about the size and shape of your heart and how well your heart's chambers and valves are working. This procedure takes approximately one hour. There are no restrictions for this procedure.  Follow-Up: At [facility], you and your health needs are our priority.  As part of our continuing mission to provide you with exceptional heart care, we have created designated Provider Care Teams.  These Care Teams include your primary Cardiologist (physician) and Advanced Practice Providers (APPs -  Physician Assistants and Nurse Practitioners) who all work together to provide you with the care you need, when you need it.  We recommend signing up for the patient portal called "MyChart".  Sign up information is provided on this After Visit Summary.  MyChart is used to connect with patients for Virtual Visits (Telemedicine).  Patients are able to view lab/test results, encounter notes, upcoming appointments, etc.  Non-urgent messages can be sent to your provider as well.   To learn more about what you can do with MyChart, go to [email or web address].    Your next appointment:   12 month(s)  The format for your next appointment:   In Person  Provider:   Candee Furbish, MD   Thank you for choosing [facility]!!        Signed, Candee Furbish, MD  08/15/2020 2:04 PM    [place]

## 2020-08-15 NOTE — Patient Instructions (Addendum)
Medication Instructions:  The current medical regimen is effective;  continue present plan and medications.  *If you need a refill on your cardiac medications before your next appointment, please call your pharmacy*  Testing/Procedures: Your physician has requested that you have an echocardiogram in 1 year. Echocardiography is a painless test that uses sound waves to create images of your heart. It provides your doctor with information about the size and shape of your heart and how well your heart's chambers and valves are working. This procedure takes approximately one hour. There are no restrictions for this procedure.  Follow-Up: At [facility], you and your health needs are our priority.  As part of our continuing mission to provide you with exceptional heart care, we have created designated Provider Care Teams.  These Care Teams include your primary Cardiologist (physician) and Advanced Practice Providers (APPs -  Physician Assistants and Nurse Practitioners) who all work together to provide you with the care you need, when you need it.  We recommend signing up for the patient portal called "MyChart".  Sign up information is provided on this After Visit Summary.  MyChart is used to connect with patients for Virtual Visits (Telemedicine).  Patients are able to view lab/test results, encounter notes, upcoming appointments, etc.  Non-urgent messages can be sent to your provider as well.   To learn more about what you can do with MyChart, go to [email or web address].    Your next appointment:   12 month(s)  The format for your next appointment:   In Person  Provider:   Candee Furbish, MD   Thank you for choosing [facility]!!

## 2020-08-17 ENCOUNTER — Ambulatory Visit (INDEPENDENT_AMBULATORY_CARE_PROVIDER_SITE_OTHER): Payer: Medicare Other | Admitting: Podiatry

## 2020-08-17 ENCOUNTER — Encounter: Payer: Self-pay | Admitting: Podiatry

## 2020-08-17 ENCOUNTER — Ambulatory Visit (INDEPENDENT_AMBULATORY_CARE_PROVIDER_SITE_OTHER): Payer: Medicare Other

## 2020-08-17 ENCOUNTER — Other Ambulatory Visit: Payer: Self-pay

## 2020-08-17 ENCOUNTER — Ambulatory Visit (INDEPENDENT_AMBULATORY_CARE_PROVIDER_SITE_OTHER): Payer: Medicare Other | Admitting: Orthotics

## 2020-08-17 DIAGNOSIS — M2142 Flat foot [pes planus] (acquired), left foot: Secondary | ICD-10-CM

## 2020-08-17 DIAGNOSIS — M2141 Flat foot [pes planus] (acquired), right foot: Secondary | ICD-10-CM

## 2020-08-17 NOTE — Progress Notes (Signed)
Sending back to Richy to be remade..deep 31mm heel cup, 4* varus r/f post

## 2020-08-19 ENCOUNTER — Encounter: Payer: Self-pay | Admitting: Podiatry

## 2020-08-19 NOTE — Progress Notes (Signed)
Subjective:  Patient ID: Christian Parsons, male    DOB: [birth year],  MRN: [id number]  Chief Complaint  Patient presents with  . Foot Pain    needs adjustments on new orthotics, flat foot left and ankle weakness and instability    74 y.o. male presents with the above complaint.  Patient was seen by me for reevaluation of orthotics.  Patient obtain orthotics without being properly seen by me.  Patient states the orthotics are still hurting him in the arch of the foot and needs some adjustments.  He he saw a break which made some adjustment but has not been helping.  He denies any other acute complaints.  He states it hurts a lot.  However with his new orthotics it started hurting him a lot in the arch of the foot as well.  He would like to obtain new pair of orthotics as they are not functioning properly.   Review of Systems: Negative except as noted in the HPI. Denies N/V/F/Ch.  Past Medical History:  Diagnosis Date  . Anxiety   . Bipolar disorder ([place])    H/O  . BPH with urinary obstruction    s/p TURP  . Chronic kidney disease   . Chronic renal insufficiency, stage 2 (mild)    secondary to lithium toxicity; baseline creatinine 1.3-1.6  . Coronary artery disease   . Depression   . Hypertension     Current Outpatient Medications:  .  aspirin EC 81 MG tablet, Take 81 mg by mouth daily., Disp: , Rfl:  .  lamoTRIgine (LAMICTAL) 200 MG tablet, Take 200 mg by mouth daily., Disp: , Rfl:  .  LORazepam (ATIVAN) 0.5 MG tablet, Take 0.5 mg by mouth as needed., Disp: , Rfl:  .  rizatriptan (MAXALT) 10 MG tablet, TAKE 1 TABLET BY MOUTH AS NEEDED FOR MIGRAINE. MAY REPEAT IN 2 HOURS IF NEEDED, Disp: 10 tablet, Rfl: 3  Social History   Tobacco Use  Smoking Status Never Smoker  Smokeless Tobacco Never Used    No Known Allergies Objective:  There were no vitals filed for this visit. There is no height or weight on file to calculate BMI. Constitutional Well developed. Well nourished.    Vascular Dorsalis pedis pulses palpable bilaterally. Posterior tibial pulses palpable bilaterally. Capillary refill normal to all digits.  No cyanosis or clubbing noted. Pedal hair growth normal.  Neurologic Normal speech. Oriented to person, place, and time. Epicritic sensation to light touch grossly present bilaterally.  Dermatologic Nails well groomed and normal in appearance. No open wounds. No skin lesions.  Orthopedic:  Mild pain on palpation to the arch of the foot.  Gait examination shows rigid pes planovalgus with unable to recruit the arch with dorsiflexion of the hallux.  Positive too many toe signs.  Calcaneovalgus noted.  Patient able to perform single or double heel raises.   Radiographs: 3 views of skeletally mature adultLeft foot: There is decreasing calcaneal inclination angle increase in talar declination angle anterior break in the cyma line midfoot arthrosis noted.  Mild elevatus of the first ray noted.  No other bony abnormalities noted. Assessment:   1. Pes planovalgus, acquired, left    Plan:  Patient was evaluated and treated and all questions answered.  Pes planovalgus bilateral -I explained patient the etiology of pes planovalgus versus treatment options were discussed.  I believe patient will benefit from new pair of custom-made orthotics with molding.  I discussed this with Liliane Channel in extensive detail.  I also discussed  with the patient that we will recast the foot to make sure that we get the proper mold of the foot.  He will follow up with [facility] for orthotics adjustments.  No follow-ups on file.

## 2020-09-26 ENCOUNTER — Encounter: Payer: Self-pay | Admitting: Registered Nurse

## 2020-09-26 ENCOUNTER — Other Ambulatory Visit: Payer: Self-pay

## 2020-09-26 ENCOUNTER — Ambulatory Visit (INDEPENDENT_AMBULATORY_CARE_PROVIDER_SITE_OTHER): Payer: Medicare Other | Admitting: Registered Nurse

## 2020-09-26 VITALS — BP 139/81 | HR 78 | Temp 98.1°F | Resp 15 | Ht 72.0 in | Wt 212.0 lb

## 2020-09-26 DIAGNOSIS — M7661 Achilles tendinitis, right leg: Secondary | ICD-10-CM | POA: Diagnosis not present

## 2020-09-26 MED ORDER — METHOCARBAMOL 500 MG PO TABS: 500.0000 mg | ORAL_TABLET | Freq: Three times a day (TID) | ORAL | 0 refills | Status: DC | PRN

## 2020-09-26 NOTE — Patient Instructions (Addendum)
Christian Parsons -   Doristine Devoid to see you. Let's pursue:  Methocarbamol (Robaxin) 500mg  by mouth 1-2 times daily (and a third before bed if you need it). Acetaminophen (Tylenol) 1000mg  by mouth every 8 hours RICE method as detailed below (heat is ok too).  Mild stretching Limit hiking for next 4-5 days Call if there's no improvement   Thank you,   Rich   If you have lab work done today you will be contacted with your lab results within the next 2 weeks.  If you have not heard from [place] then please contact us. The fastest way to get your results is to register for My Chart.   IF you received an x-ray today, you will receive an invoice from [facility]. Please contact [facility] at [phone] with questions or concerns regarding your invoice.   IF you received labwork today, you will receive an invoice from [facility]. Please contact [facility] at [phone] with questions or concerns regarding your invoice.   Our billing staff will not be able to assist you with questions regarding bills from these companies.  You will be contacted with the lab results as soon as they are available. The fastest way to get your results is to activate your My Chart account. Instructions are located on the last page of this paperwork. If you have not heard from [place] regarding the results in 2 weeks, please contact this office.     RICE Therapy for Routine Care of Injuries The routine care of many injuries includes rest, ice, compression, and elevation (RICE therapy). RICE therapy is often recommended for injuries to soft tissues, such as muscle strain, sprains, bruises, and overuse injuries. It can also be used for some bone injuries. Using RICE therapy can help to relieve pain and lessen swelling. Supplies needed:  Ice.  Plastic bag.  Towel.  Elastic bandage.  Pillow or pillows to raise (elevate) the injured body part. How to care for your injury with RICE therapy Rest Rest your  injury. This may help with the healing process. Rest usually involves limiting your normal activities and not using the injured part of your body. Generally, you can return to your normal activities when your health care provider says it is okay and you can do them without much discomfort. If you rest the injury too much, it may not heal as well. Some injuries heal better with early movement instead of resting for too long. Talk with your health care provider about how you should limit your activities and whether you should start range-of-motion exercises for your injury. Ice Ice your injury to lessen swelling and pain. Do not apply ice directly to your skin.  Put ice in a plastic bag.  Place a towel between your skin and the bag.  Leave the ice on for 20 minutes, 2-3 times a day. Use ice on as many days as told by your health care provider.   Compression Put pressure (compression) on your injured area to control swelling, give support, and help with discomfort. Compression may be done with an elastic bandage. If an elastic bandage has been applied, follow these general tips:  Use the bandage as directed by the maker of the bandage that you are using.  Do notwrap the bandage too tightly. That may block (cut off) circulation in the arm or leg in the area below the bandage. ? If part of your body beyond the bandage becomes blue, numb, cold, swollen, or more painful, your bandage is probably  too tight. If this occurs, remove your bandage and reapply it more loosely.  Remove and reapply the bandage every 3-4 hours or as told by your health care provider.  See your health care provider if the bandage seems to be making your problems worse rather than better.   Elevation Elevate your injured area to lessen swelling and pain. If possible, elevate your injured area at or above the level of your heart or the center of your chest. Contact a health care provider if:  Your pain and swelling  continue.  Your symptoms are getting worse rather than improving. Having these problems may mean that you need further evaluation or imaging tests, such as X-rays or an MRI. Sometimes, X-rays may not show a small broken bone (fracture) until days after the injury happened. Make a follow-up appointment with your health care provider. Ask your health care provider, or the department that is doing the imaging test, when your results will be ready. Get help right away if:  You have sudden severe pain at or below the area of your injury.  You have redness or increased swelling around your injury.  You have tingling or numbness at or below the area of your injury and it does not improve after you remove the elastic bandage. Summary  The routine care of many injuries includes rest, ice, compression, and elevation (RICE therapy). Using RICE therapy can help to relieve pain and lessen swelling.  RICE therapy is often recommended for injuries to soft tissues, such as muscle strain, sprains, bruises, and overuse injuries. It can also be used for some bone injuries.  Seek medical care if your pain and swelling continue or if your symptoms are getting worse rather than improving. This information is not intended to replace advice given to you by your health care provider. Make sure you discuss any questions you have with your health care provider. Document Revised: 11/08/2019 Document Reviewed: 05/23/2017 Elsevier Patient Education  2021 [employer] Inc.  Ankle Pain The ankle joint holds your body weight and allows you to move around. Ankle pain can occur on either side or the back of one ankle or both ankles. Ankle pain may be sharp and burning or dull and aching. There may be tenderness, stiffness, redness, or warmth around the ankle. Many things can cause ankle pain, including an injury to the area and overuse of the ankle. Follow these instructions at home: Activity  Rest your ankle as told by your  health care provider. Avoid any activities that cause ankle pain.  Do not use the injured limb to support your body weight until your health care provider says that you can. Use crutches as told by your health care provider.  Do exercises as told by your health care provider.  Ask your health care provider when it is safe to drive if you have a brace on your ankle. If you have a brace:  Wear the brace as told by your health care provider. Remove it only as told by your health care provider.  Loosen the brace if your toes tingle, become numb, or turn cold and blue.  Keep the brace clean.  If the brace is not waterproof: ? Do not let it get wet. ? Cover it with a watertight covering when you take a bath or shower. If you were given an elastic bandage:  Remove it when you take a bath or a shower.  Try not to move your ankle very much, but wiggle your toes  from time to time. This helps to prevent swelling.  Adjust the bandage to make it more comfortable if it feels too tight.  Loosen the bandage if you have numbness or tingling in your foot or if your foot turns cold and blue.   Managing pain, stiffness, and swelling  If directed, put ice on the painful area. ? If you have a removable brace or elastic bandage, remove it as told by your health care provider. ? Put ice in a plastic bag. ? Place a towel between your skin and the bag. ? Leave the ice on for 20 minutes, 2-3 times a day.  Move your toes often to avoid stiffness and to lessen swelling.  Raise (elevate) your ankle above the level of your heart while you are sitting or lying down.   General instructions  Record information about your pain. Writing down the following may be helpful for you and your health care provider: ? How often you have ankle pain. ? Where the pain is located. ? What the pain feels like.  If treatment involves wearing a prescribed shoe or insole, make sure you wear it correctly and for as long as  told by your health care provider.  Take over-the-counter and prescription medicines only as told by your health care provider.  Keep all follow-up visits as told by your health care provider. This is important. Contact a health care provider if:  Your pain gets worse.  Your pain is not relieved with medicines.  You have a fever or chills.  You are having more trouble with walking.  You have new symptoms. Get help right away if:  Your foot, leg, toes, or ankle: ? Tingles or becomes numb. ? Becomes swollen. ? Turns pale or blue. Summary  Ankle pain can occur on either side or the back of one ankle or both ankles.  Ankle pain may be sharp and burning or dull and aching.  Rest your ankle as told by your health care provider. If told, apply ice to the area.  Take over-the-counter and prescription medicines only as told by your health care provider. This information is not intended to replace advice given to you by your health care provider. Make sure you discuss any questions you have with your health care provider. Document Revised: 12/22/2018 Document Reviewed: 03/11/2018 Elsevier Patient Education  2021 [place].  Tendinitis  Tendinitis is inflammation of a tendon. A tendon is a strong cord of tissue that connects muscle to bone. Tendinitis can affect any tendon, but it most commonly affects the:  Shoulder tendon (rotator cuff).  Ankle tendon (Achilles tendon).  Elbow tendon (triceps tendon).  Tendons in the wrist. What are the causes? This condition may be caused by:  Overusing a tendon or muscle. This is common.  Age-related wear and tear.  Injury.  Inflammatory conditions, such as arthritis.  Certain medicines. What increases the risk? You are more likely to develop this condition if you do activities that involve the same movements over and over again (repetitive motions). What are the signs or symptoms? Symptoms of this condition may  include:  Pain.  Tenderness.  Mild swelling.  Decreased range of motion. How is this diagnosed? This condition is diagnosed with a physical exam. You may also have tests, such as:  Ultrasound. This uses sound waves to make an image of the inside of your body in the affected area.  MRI. How is this treated? This condition may be treated by resting, icing, applying  pressure (compression), and raising (elevating) the affected area above the level of your heart. This is known as RICE therapy. Treatment may also include:  Medicines to help reduce inflammation or to help reduce pain.  Exercises or physical therapy to strengthen and stretch the tendon.  A brace or splint.  Surgery. This is rarely needed. Follow these instructions at home: If you have a splint or brace:  Wear the splint or brace as told by your health care provider. Remove it only as told by your health care provider.  Loosen the splint or brace if your fingers or toes tingle, become numb, or turn cold and blue.  Keep the splint or brace clean.  If the splint or brace is not waterproof: ? Do not let it get wet. ? Cover it with a watertight covering when you take a bath or shower. Managing pain, stiffness, and swelling  If directed, put ice on the affected area. ? If you have a removable splint or brace, remove it as told by your health care provider. ? Put ice in a plastic bag. ? Place a towel between your skin and the bag. ? Leave the ice on for 20 minutes, 2-3 times a day.  Move the fingers or toes of the affected limb often, if this applies. This can help to prevent stiffness and lessen swelling.  If directed, raise (elevate) the affected area above the level of your heart while you are sitting or lying down.   If directed, apply heat to the affected area before you exercise. Use the heat source that your health care provider recommends, such as a moist heat pack or a heating pad. ? Place a towel between  your skin and the heat source. ? Leave the heat on for 20-30 minutes. ? Remove the heat if your skin turns bright red. This is especially important if you are unable to feel pain, heat, or cold. You may have a greater risk of getting burned.      Driving  Do not drive or use heavy machinery while taking prescription pain medicine.  Ask your health care provider when it is safe to drive if you have a splint or brace on any part of your arm or leg. Activity  Rest the affected area as told by your health care provider.  Return to your normal activities as told by your health care provider. Ask your health care provider what activities are safe for you.  Avoid using the affected area while you are experiencing symptoms of tendinitis.  Do exercises as told by your health care provider. General instructions  If you have a splint, do not put pressure on any part of the splint until it is fully hardened. This may take several hours.  Wear an elastic bandage or compression wrap only as told by your health care provider.  Take over-the-counter and prescription medicines only as told by your health care provider.  Keep all follow-up visits as told by your health care provider. This is important. Contact a health care provider if:  Your symptoms do not improve.  You develop new, unexplained problems, such as numbness in your hands. Summary  Tendinitis is inflammation of a tendon.  You are more likely to develop this condition if you do activities that involve the same movements over and over again.  This condition may be treated by resting, icing, applying pressure (compression), and elevating the area above the level of your heart. This is known as RICE therapy.  Avoid using the affected area while you are experiencing symptoms of tendinitis. This information is not intended to replace advice given to you by your health care provider. Make sure you discuss any questions you have with  your health care provider. Document Revised: 03/10/2018 Document Reviewed: 01/21/2018 Elsevier Patient Education  [employer].  [employer]'s Emergency Medicine: Concepts and Clinical Practice (9th ed., pp. [phone]). [place], [place]: [place]. Retrieved from https://www.clinicalkey.com/#!/content/book/3-[email or web address]-[email or web address]?scrollTo=%23hl0000251">  Achilles Tendinitis  Achilles tendinitis is inflammation of the tough, cord-like band that attaches the lower leg muscles to the heel bone (Achilles tendon). This is usually caused by overusing the tendon and the ankle joint. Achilles tendinitis usually gets better over time with treatment and caring for yourself at home. It can take weeks or months to heal completely. What are the causes? This condition may be caused by:  A sudden increase in exercise or activity, such as running.  Doing the same exercises or activities, such as jumping, over and over.  Not warming up calf muscles before exercising.  Exercising in shoes that are worn out or not made for exercise.  Having arthritis or a bone growth (spur) on the back of the heel bone. This can rub against the tendon and hurt it.  Age-related wear and tear. Tendons become less flexible with age and are more likely to be injured. What are the signs or symptoms? Common symptoms of this condition include:  Pain in the Achilles tendon or in the back of the leg, just above the heel. The pain usually gets worse with exercise.  Stiffness or soreness in the back of the leg, especially in the morning.  Swelling of the skin over the Achilles tendon.  Thickening of the tendon.  Trouble standing on tiptoe. How is this diagnosed? This condition is diagnosed based on your symptoms and a physical exam. You may have tests, including:  X-rays.  MRI. How is this treated? The goal of treatment is to relieve symptoms and help your injury heal. Treatment may include:  Decreasing or  stopping activities that caused the tendinitis. This may mean switching to low-impact exercises like biking or swimming.  Icing the injured area.  Doing physical therapy, including strengthening and stretching exercises.  Taking NSAIDs, such as ibuprofen, to help relieve pain and swelling.  Using supportive shoes, wraps, heel lifts, or a walking boot (air cast).  Having surgery. This may be done if your symptoms do not improve after other treatments.  Using high-energy shock wave impulses to stimulate the healing process (extracorporeal shock wave therapy). This is rare.  Having an injection of medicines that help relieve inflammation (corticosteroids). This is rare. Follow these instructions at home: If you have an air cast:  Wear the air cast as told by your health care provider. Remove it only as told by your health care provider.  Loosen it if your toes tingle, become numb, or turn cold and blue.  Keep it clean.  If the air cast is not waterproof: ? Do not let it get wet. ? Cover it with a watertight covering when you take a bath or shower. Managing pain, stiffness, and swelling  If directed, put ice on the injured area. To do this: ? If you have a removable air cast, remove it as told by your health care provider. ? Put ice in a plastic bag. ? Place a towel between your skin and the bag. ? Leave the ice on for 20 minutes, 2-3 times a day.  Move your  toes often to reduce stiffness and swelling.  Raise (elevate) your foot above the level of your heart while you are sitting or lying down.   Activity  Gradually return to your normal activities as told by your health care provider. Ask your health care provider what activities are safe for you.  Do not do activities that cause pain.  Consider doing low-impact exercises, like cycling or swimming.  Ask your health care provider when it is safe to drive if you have an air cast on your foot.  If physical therapy was  prescribed, do exercises as told by your health care provider or physical therapist. General instructions  If directed, wrap your foot with an elastic bandage or other wrap. This can help to keep your tendon from moving too much while it heals. Your health care provider will show you how to wrap your foot correctly.  Wear supportive shoes or heel lifts only as told by your health care provider.  Take over-the-counter and prescription medicines only as told by your health care provider.  Keep all follow-up visits as told by your health care provider. This is important. Contact a health care provider if you:  Have symptoms that get worse.  Have pain that does not get better with medicine.  Develop new, unexplained symptoms.  Develop warmth and swelling in your foot.  Have a fever. Get help right away if you:  Have a sudden popping sound or sensation in your Achilles tendon followed by severe pain.  Cannot move your toes or foot.  Cannot put any weight on your foot.  Your foot or toes become numb and look white or blue even after loosening your bandage or air cast. Summary  Achilles tendinitis is inflammation of the tough, cord-like band that attaches the lower leg muscles to the heel bone (Achilles tendon).  This condition is usually caused by overusing the tendon and the ankle joint. It can also be caused by arthritis or normal aging.  The most common symptoms of this condition include pain, swelling, or stiffness in the Achilles tendon or in the back of the leg.  This condition is usually treated by decreasing or stopping activities that caused the tendinitis, icing the injured area, taking NSAIDs, and doing physical therapy. This information is not intended to replace advice given to you by your health care provider. Make sure you discuss any questions you have with your health care provider. Document Revised: 01/18/2019 Document Reviewed: 01/18/2019 Elsevier Patient  Education  [place].

## 2020-09-26 NOTE — Progress Notes (Signed)
Acute Office Visit  Subjective:    Patient ID: Christian Parsons, male    DOB: 11-21-1945, 75 y.o.   MRN: [id number]  Chief Complaint  Patient presents with  . Ankle Injury    Pt has been hiking for a long time 30-40 miles a week on average, went hiking yesterday noticed pain in RT ankle tendon half way through his hike and has since progressively gotten worse. Pt was trying a new pain or orthotic inserts unsure if this contributed or not.     HPI Patient is in today for acute R ankle pain  Onset yesterday. No acute injury noted but building pain through 4 mile hike Had trouble weightbearing when he got home Some tylenol helped Heat helped Feeling marginally better today Full rom in ankle but pain with resistance  Past Medical History:  Diagnosis Date  . Anxiety   . Bipolar disorder ([place])    H/O  . BPH with urinary obstruction    s/p TURP  . Chronic kidney disease   . Chronic renal insufficiency, stage 2 (mild)    secondary to lithium toxicity; baseline creatinine 1.3-1.6  . Coronary artery disease   . Depression   . Hypertension     Past Surgical History:  Procedure Laterality Date  . HERNIA REPAIR    . PROSTATE SURGERY    . TRANSURETHRAL RESECTION OF PROSTATE      Family History  Problem Relation Age of Onset  . Cancer Mother        breast cancer  . Dementia Father   . Arthritis Sister        hip OA s/p hip replacement    Social History   Socioeconomic History  . Marital status: Married    Spouse name: Not on file  . Number of children: 1  . Years of education: 16+  . Highest education level: Professional school degree (e.g., MD, DDS, DVM, JD)  Occupational History  . Occupation: retired    Comment: Music therapist  Tobacco Use  . Smoking status: Never Smoker  . Smokeless tobacco: Never Used  Substance and Sexual Activity  . Alcohol use: Yes    Alcohol/week: 2.0 standard drinks    Types: 2 Standard drinks or equivalent per week    Comment:  Occasional  . Drug use: No  . Sexual activity: Yes    Partners: Female    Birth control/protection: Post-menopausal  Other Topics Concern  . Not on file  Social History Narrative   Marital status: married x 48 years      Children: 1 daughter (65yo Professor Ronnald Shedden), no grandchildren.       Lives: with wife/[name]      Employment: retired at age 27 years of life; [place] x 12 years      Tobacco: never      Alcohol: rare in 2018; special occasions only      Exercise: walks 4-5 days per week also hikes 4 days per week 1-7 hours per day.  Swimming at [employer].       Advanced Directives; YES; full code but no prolonged measures.      ADLs: independent with ADLs in 2018; drives.   Social Determinants of Health   Financial Resource Strain: Not on file  Food Insecurity: Not on file  Transportation Needs: Not on file  Physical Activity: Not on file  Stress: Not on file  Social Connections: Not on file  Intimate Partner Violence: Not on file  Outpatient Medications Prior to Visit  Medication Sig Dispense Refill  . aspirin EC 81 MG tablet Take 81 mg by mouth daily.    Marland Kitchen lamoTRIgine (LAMICTAL) 200 MG tablet Take 200 mg by mouth daily.    Marland Kitchen LORazepam (ATIVAN) 0.5 MG tablet Take 0.5 mg by mouth as needed.    . rizatriptan (MAXALT) 10 MG tablet TAKE 1 TABLET BY MOUTH AS NEEDED FOR MIGRAINE. MAY REPEAT IN 2 HOURS IF NEEDED 10 tablet 3   No facility-administered medications prior to visit.    No Known Allergies  Review of Systems  Constitutional: Negative.   HENT: Negative.   Eyes: Negative.   Respiratory: Negative.   Cardiovascular: Negative.   Gastrointestinal: Negative.   Genitourinary: Negative.   Musculoskeletal: Positive for arthralgias.  Skin: Negative.   Neurological: Negative.   Psychiatric/Behavioral: Negative.        Objective:    Physical Exam Vitals and nursing note reviewed.  Constitutional:      Appearance: Normal appearance.   Cardiovascular:     Rate and Rhythm: Normal rate and regular rhythm.     Pulses: Normal pulses.     Heart sounds: Normal heart sounds. No murmur heard. No friction rub. No gallop.   Pulmonary:     Effort: Pulmonary effort is normal. No respiratory distress.     Breath sounds: Normal breath sounds. No stridor. No wheezing, rhonchi or rales.  Musculoskeletal:        General: Tenderness present. No swelling, deformity or signs of injury. Normal range of motion.     Right lower leg: No edema.     Left lower leg: No edema.  Skin:    General: Skin is warm and dry.  Neurological:     General: No focal deficit present.     Mental Status: He is alert. Mental status is at baseline.  Psychiatric:        Mood and Affect: Mood normal.        Behavior: Behavior normal.        Thought Content: Thought content normal.        Judgment: Judgment normal.     BP 139/81   Pulse 78   Temp 98.1 F (36.7 C) (Temporal)   Resp 15   Ht 6' (1.829 m)   Wt 212 lb (96.2 kg)   SpO2 99%   BMI 28.75 kg/m  Wt Readings from Last 3 Encounters:  09/26/20 212 lb (96.2 kg)  08/15/20 205 lb (93 kg)  03/27/20 203 lb (92.1 kg)    There are no preventive care reminders to display for this patient.  There are no preventive care reminders to display for this patient.   Lab Results  Component Value Date   TSH 1.006 08/25/2015   Lab Results  Component Value Date   WBC 5.1 03/27/2020   HGB 14.1 03/27/2020   HCT 40.6 03/27/2020   MCV 93 03/27/2020   PLT 153 06/04/2019   Lab Results  Component Value Date   NA 141 09/28/2019   K 4.5 09/28/2019   CO2 24 09/28/2019   GLUCOSE 92 09/28/2019   BUN 23 09/28/2019   CREATININE 1.53 (H) 09/28/2019   BILITOT 0.6 09/28/2019   ALKPHOS 85 09/28/2019   AST 20 09/28/2019   ALT 25 09/28/2019   PROT 6.8 09/28/2019   ALBUMIN 4.1 09/28/2019   CALCIUM 9.5 09/28/2019   Lab Results  Component Value Date   CHOL 124 03/27/2020   Lab Results  Component Value  Date   HDL 38 (L) 03/27/2020   Lab Results  Component Value Date   LDLCALC 69 03/27/2020   Lab Results  Component Value Date   TRIG 87 03/27/2020   Lab Results  Component Value Date   CHOLHDL 3.3 03/27/2020   Lab Results  Component Value Date   HGBA1C 5.4 08/25/2015       Assessment & Plan:   Problem List Items Addressed This Visit   None   Visit Diagnoses    Achilles tendinitis of right lower extremity    -  Primary   Relevant Medications   methocarbamol (ROBAXIN) 500 MG tablet       Meds ordered this encounter  Medications  . methocarbamol (ROBAXIN) 500 MG tablet    Sig: Take 1 tablet (500 mg total) by mouth every 8 (eight) hours as needed for muscle spasms.    Dispense:  60 tablet    Refill:  0    Order Specific Question:   Supervising Provider    Answer:   Carlota Raspberry, JEFFREY R [[id number]]   PLAN  Around the clock tylenol, RICE  Methocarbamol 500mg  po bid prn  Patient encouraged to call clinic with any questions, comments, or concerns.   Maximiano Coss, NP

## 2020-10-26 ENCOUNTER — Other Ambulatory Visit: Payer: Self-pay

## 2020-10-26 ENCOUNTER — Ambulatory Visit (INDEPENDENT_AMBULATORY_CARE_PROVIDER_SITE_OTHER): Payer: Medicare Other | Admitting: Sports Medicine

## 2020-10-26 ENCOUNTER — Encounter: Payer: Self-pay | Admitting: Sports Medicine

## 2020-10-26 DIAGNOSIS — M2142 Flat foot [pes planus] (acquired), left foot: Secondary | ICD-10-CM | POA: Diagnosis not present

## 2020-10-26 DIAGNOSIS — M2141 Flat foot [pes planus] (acquired), right foot: Secondary | ICD-10-CM | POA: Diagnosis present

## 2020-10-26 NOTE — Progress Notes (Addendum)
   Subjective:    Patient ID: Christian Parsons, male    DOB: [birth year], 75 y.o.   MRN: [id number]  HPI chief complaint: Bilateral leg pain and right knee pain  Christian Parsons is a very pleasant 75 year old male that comes in today with returning bilateral leg pain and right knee pain.  He has had similar issues in his legs in the past.  An MRI done in 2018 showed moderate to severe lower lumbar facet arthropathy but no evidence of high-grade spinal or neuroforaminal stenosis.  We treated him with some custom orthotics and his symptoms improved dramatically.  He was doing well until recently when he went to a podiatrist about a possible ingrown toenail.  During that visit he decided to ask them for new custom orthotics.  However, since getting those orthotics, he has had worsening pain in his legs and new onset medial right knee pain.  He questions whether or not the orthotics may be the problem.  Pain in the right leg begins in the hamstring area and will radiate down past the knee into the calf.  His right knee pain is over the medial joint space where he is also noticed an area of swelling.  He is an avid hiker and has been unable to enjoy hiking due to his pain.  Past medical history reviewed Medications reviewed Allergies reviewed    Review of Systems As above    Objective:   Physical Exam  Well-developed, well-nourished.  No acute distress.  There is no appreciable atrophy of either lower extremity.  Examination of the right knee shows good range of motion but he is tender to palpation along the medial joint line.  There is a palpable area of swelling here which may be consistent with a bulging medial meniscus.  Examination of both feet shows significant pes planus bilaterally with hindfoot valgus, left greater than right.  No swelling.        Assessment & Plan:   Returning bilateral leg pain possibly secondary to lumbar radiculopathy Pes planus Right knee pain likely secondary to  DJD  Patient had excellent success in the past with our custom orthotics.  I was able to evaluate the orthotics recently made at the podiatrist office and they are quite rigid.  Christian Parsons would like to go ahead today and proceed with a pair of our custom orthotics to use in his hiking boots.  We will also replace the green sports insoles and scaphoid pads in another pair of his shoes. Follow up in 4 weeks to see if his leg pain and knee pain have resolved.  If not, then we may discuss formal physical therapy.  Call with questions or concerns in the interim.  Patient was fitted for a : standard, cushioned, semi-rigid orthotic. The orthotic was heated and afterward the patient stood on the orthotic blank positioned on the orthotic stand. The patient was positioned in subtalar neutral position and 10 degrees of ankle dorsiflexion in a weight bearing stance. After completion of molding, a stable base was applied to the orthotic blank. The blank was ground to a stable position for weight bearing. Size: 13 Base: Blue EVA Posting: none Additional orthotic padding: left medial heel wedge

## 2020-10-29 ENCOUNTER — Other Ambulatory Visit: Payer: Self-pay | Admitting: Registered Nurse

## 2020-10-29 DIAGNOSIS — G43109 Migraine with aura, not intractable, without status migrainosus: Secondary | ICD-10-CM

## 2020-10-29 NOTE — Telephone Encounter (Signed)
Approved per protocol.  Requested Prescriptions  Pending Prescriptions Disp Refills  . rizatriptan (MAXALT) 10 MG tablet [Pharmacy Med Name: RIZATRIPTAN 10 MG TABLET] 10 tablet 2    Sig: TAKE 1 TABLET BY MOUTH AS NEEDED FOR MIGRAINE. MAY REPEAT IN 2 HOURS IF NEEDED     Neurology:  Migraine Therapy - Triptan Passed - 10/29/2020  8:21 AM      Passed - Last BP in normal range    BP Readings from Last 1 Encounters:  10/26/20 128/82         Passed - Valid encounter within last 12 months    Recent Outpatient Visits          1 month ago Achilles tendinitis of right lower extremity   [facility], NP   7 months ago Kidney stones   [facility], Delfino Lovett, NP   1 year ago Screening for endocrine, metabolic and immunity disorder   [facility], Delfino Lovett, NP   1 year ago Encounter to establish care   [facility], Delfino Lovett, NP   1 year ago Dizziness   [facility], MD             Last OV 09/26/20

## 2020-11-15 ENCOUNTER — Other Ambulatory Visit: Payer: Self-pay

## 2020-11-15 DIAGNOSIS — M25561 Pain in right knee: Secondary | ICD-10-CM

## 2020-11-15 NOTE — Progress Notes (Signed)
Pt called wanting to start PT for his right knee.  Order placed.

## 2020-11-17 ENCOUNTER — Ambulatory Visit: Payer: Medicare Other | Attending: Sports Medicine

## 2020-11-17 ENCOUNTER — Other Ambulatory Visit: Payer: Self-pay

## 2020-11-17 DIAGNOSIS — M25561 Pain in right knee: Secondary | ICD-10-CM | POA: Insufficient documentation

## 2020-11-17 DIAGNOSIS — M25661 Stiffness of right knee, not elsewhere classified: Secondary | ICD-10-CM | POA: Insufficient documentation

## 2020-11-17 DIAGNOSIS — R262 Difficulty in walking, not elsewhere classified: Secondary | ICD-10-CM | POA: Insufficient documentation

## 2020-11-17 NOTE — Therapy (Signed)
[facility] [place], [place], [address] Phone: [phone]   Fax:  [phone]  Physical Therapy Evaluation  Patient Details  Name: Christian Parsons MRN: [id number] Date of Birth: [birth year] Referring Provider (PT): Thurman Coyer, DO   Encounter Date: 11/17/2020   PT End of Session - 11/17/20 1453    Visit Number 1    Number of Visits 7    Date for PT Re-Evaluation 01/06/21    Authorization Type MEDICARE PART A AND B    Authorization Time Period A[employer]    Progress Note Due on Visit 10    PT Start Time 0920    PT Stop Time 1010    PT Time Calculation (min) 50 min    Activity Tolerance Patient tolerated treatment well    Behavior During Therapy [facility] for tasks assessed/performed           Past Medical History:  Diagnosis Date  . Anxiety   . Bipolar disorder ([place])    H/O  . BPH with urinary obstruction    s/p TURP  . Chronic kidney disease   . Chronic renal insufficiency, stage 2 (mild)    secondary to lithium toxicity; baseline creatinine 1.3-1.6  . Coronary artery disease   . Depression   . Hypertension     Past Surgical History:  Procedure Laterality Date  . HERNIA REPAIR    . PROSTATE SURGERY    . TRANSURETHRAL RESECTION OF PROSTATE      There were no vitals filed for this visit.    Subjective Assessment - 11/17/20 1443    Subjective Pt reports 5 to 6 weeks ago hiking on a trail and tripped on a root, fell, and landed on the inside of his R knee. Pt does not believe he twisted his knee, but is not completely sure. Pt does not feel his knee is getting better. Pt does report more issues with his balance.    Limitations Walking    How long can you sit comfortably? no issue    How long can you stand comfortably? a long time    How long can you walk comfortably? 1 mile    Patient Stated Goals Pt is an avid hiker and would like to return to his previous level. Pt would hike 6 miles in 2 hours.     Currently in Pain? Yes    Pain Score 7     Pain Location Knee    Pain Orientation Right;Medial    Pain Descriptors / Indicators Aching;Sharp    Pain Type Acute pain    Pain Radiating Towards Na    Pain Onset More than a month ago    Pain Frequency Intermittent    Aggravating Factors  Sit t/f standing, inital walking, extended walking    Pain Relieving Factors Rest              [facility] PT Assessment - 11/17/20 0001      Assessment   Medical Diagnosis Right knee pain, unspecified chronicity    Referring Provider (PT) Thurman Coyer, DO    Onset Date/Surgical Date --   5-6 weeks ago   Hand Dominance Right    Prior Therapy No      Precautions   Precautions None      Restrictions   Weight Bearing Restrictions Yes      Balance Screen   Has the patient fallen in the past 6 months Yes    How many  times? 1    Has the patient had a decrease in activity level because of a fear of falling?  Yes    Is the patient reluctant to leave their home because of a fear of falling?  No      Home Environment   Living Environment Private residence    Living Arrangements Spouse/significant other    Type of [place] to enter    Entrance Stairs-Number of Steps 6    Entrance Stairs-Rails Right;Left;Can reach both    [place] Two level    Alternate Level Stairs-Number of Steps 14    Alternate Level Stairs-Rails Right;Left      Prior Function   Level of Independence Independent    Vocation Retired    Leisure Architect   Overall Cognitive Status Within Functional Limits for tasks assessed    Memory --   Pt reports some memory issues     Observation/Other Assessments   Focus on Therapeutic Outcomes (FOTO)  61% functional ability      Sensation   Light Touch Appears Intact      ROM / Strength   AROM / PROM / Strength AROM;Strength      AROM   AROM Assessment Site Knee    Right/Left Knee Right;Left    Right Knee Extension -8    Right  Knee Flexion 105    Left Knee Extension 0    Left Knee Flexion 125      Strength   Overall Strength Comments Resisted R knee and hip movements inconsistently increased pt's R medial knee pain    Strength Assessment Site Hip;Knee    Right/Left Hip Right;Left    Right Hip Flexion 4+/5    Right Hip Extension 4+/5    Right Hip External Rotation  4+/5    Right Hip Internal Rotation 4+/5    Right Hip ABduction 4+/5    Right Hip ADduction 4+/5    Left Hip Flexion 5/5    Left Hip Extension 5/5    Left Hip External Rotation 5/5    Left Hip Internal Rotation 5/5    Left Hip ABduction 5/5    Left Hip ADduction 5/5    Right/Left Knee Right;Left    Right Knee Flexion 4+/5    Right Knee Extension 4+/5    Left Knee Flexion 5/5    Left Knee Extension 5/5      Palpation   Patella mobility WFLs    Palpation comment TTP to the r medial joint line      Special Tests    Special Tests Laxity/Instability Tests;Meniscus Tests    Laxity/Instability  Anterior drawer test;Posterior drawer test;other;other2    Meniscus Tests McMurray Test      Anterior drawer test   Findings Negative    Side Right      Posterior drawer test   Findings Negative    Side  Right      Other   Findings Negative    side Right    comment Medial collateral- painful, not lax      other   Findings Negative    side Right    Comment Lateral collateral testing      McMurray Test   Findings Negative    Side Right      Transfers   Transfers Sit to Stand;Stand to Sit    Sit to Stand 6: Modified independent (Device/Increase time)   Decreased  [place] R LE     Ambulation/Gait   Ambulation/Gait Yes    Ambulation/Gait Assistance 6: Modified independent (Device/Increase time)    Gait Pattern Antalgic                      Objective measurements completed on examination: See above findings.               PT Education - 11/17/20 1450    Education Details Eval finding, POC, HEP, walking program-  10 mins 2x as day c hiking sticks as tolerated, progressing 5 mins every 3 days, use of cross friction massage 3x per day for 5 mins, RICE for increase in symptoms    Person(s) Educated Patient    Methods Explanation;Demonstration;Tactile cues;Verbal cues;Handout    Comprehension Verbalized understanding;Returned demonstration;Verbal cues required;Tactile cues required;Need further instruction            PT Short Term Goals - 11/17/20 1515      PT SHORT TERM GOAL #1   Title Pt will be ind in an initail HEP    Baseline started on eval    Target Date 12/08/20      PT SHORT TERM GOAL #2   Title Pt will voice understanding of measures to assist in pain reduction and management    Status New    Target Date 12/08/20             PT Long Term Goals - 11/17/20 1521      PT LONG TERM GOAL #1   Title Pt will report a decrease in R knee pain with ADLs to 2/10 or less    Baseline 7/10    Status New    Target Date 01/06/21      PT LONG TERM GOAL #2   Title Pt will report a decrease in R knee pain to 4/10 with 1 hour of walking with or without hiking sticks    Baseline 7/10    Status New    Target Date 01/06/21      PT LONG TERM GOAL #3   Title Pt's FOTO score will improve to 71% functional ability    Baseline 61% functional ability    Status New    Target Date 01/06/21      PT LONG TERM GOAL #4   Title Pt will be Ind in a final HEP to maintain or progress the achieved LOF    Status New    Target Date 01/06/21      PT LONG TERM GOAL #5   Title Will set additional functional goals as 5xST, TUG, and balance are assessed    Status New    Target Date 01/06/21                  Plan - 11/17/20 1449    Clinical Impression Statement Pt presents to PT 5 to 6 weeks following a fall where he hit the inside of his R knee. Pt appears to expeienced a contusion/medial collateral strain. Pt responded positively to cross friction massage with less pain and greater ease of movement.  A HEP for strength and a walking program were initiated. Pt does report some issues with his memory. The R knee is still swollen and has a mod decrease AROM. Hip and knee strength is min. less than L, possibly due to pain which was inconsistent with repeated testing. Pt is TTP to the medial joint line of the R knee. Pt will benefit from skilled  PT 1w6 to decrease pain, and increase strength and ROM to assist pt in returning to his previous level of function. Assessing balance needs to be part of POC with fall, pt's reporting concern for balance, and his desire to return to hiking uneven trails of low to moderate difficulty.    Personal Factors and Comorbidities Age;Comorbidity 3+    Comorbidities See medical history    Examination-Activity Limitations Locomotion Level;Squat;Stairs    Examination-Participation Restrictions Other   recreation   Stability/Clinical Decision Making Stable/Uncomplicated    Clinical Decision Making Low    Rehab Potential Good    PT Frequency 1x / week    PT Duration 6 weeks    PT Treatment/Interventions ADLs/Self Care Home Management;Cryotherapy;Electrical Stimulation;Ultrasound;Moist Heat;Iontophoresis 4mg /ml Dexamethasone;Therapeutic activities;Therapeutic exercise;Functional mobility training;Balance training;Manual techniques;Patient/family education;Passive range of motion;Dry needling;Taping;Vasopneumatic Device    PT Next Visit Plan Assess reponse to HEP, walking program, education. Review FOTO score, assess 5xSTS and TUG. In future assess balance.    PT Home Exercise Plan [id number]    Consulted and Agree with Plan of Care Patient           Patient will benefit from skilled therapeutic intervention in order to improve the following deficits and impairments:  Difficulty walking,Decreased range of motion,Decreased strength,Decreased balance,Pain,Decreased activity tolerance  Visit Diagnosis: Right knee pain, unspecified chronicity  Difficulty in walking, not  elsewhere classified  Decreased ROM of right knee     Problem List Patient Active Problem List   Diagnosis Date Noted  . Atherosclerosis of aorta ([place]) 11/18/2017  . Left sided sciatica 11/18/2017  . Pes planus 03/13/2017  . Knee pain 03/11/2017  . BPH with urinary obstruction 08/12/2016  . CKD (chronic kidney disease) stage 2 or early 3 07/11/2015  . Lithium nephropathy 03/16/2015  . Ventral hernia without obstruction or gangrene 02/17/2015  . Diastasis recti 12/22/2014  . Erectile dysfunction 12/22/2014  . Benign essential tremor 06/16/2013  . Migraine headache with aura 12/25/2011  . BMI 31.0-31.9,adult 12/25/2011  . Right bundle branch block 12/25/2011  . Mood disorder [facility])     Gar Ponto MS, PT 11/17/20 3:29 PM  [facility] [address] [place], [place], [address] Phone: [phone]   Fax:  [phone]  Name: Christian Parsons MRN: [id number] Date of Birth: October 25, 1945

## 2020-11-20 ENCOUNTER — Ambulatory Visit: Payer: Medicare Other | Admitting: Physical Therapy

## 2020-11-20 ENCOUNTER — Encounter: Payer: Self-pay | Admitting: Physical Therapy

## 2020-11-20 ENCOUNTER — Other Ambulatory Visit: Payer: Self-pay

## 2020-11-20 DIAGNOSIS — M25561 Pain in right knee: Secondary | ICD-10-CM

## 2020-11-20 DIAGNOSIS — M25661 Stiffness of right knee, not elsewhere classified: Secondary | ICD-10-CM

## 2020-11-20 DIAGNOSIS — R262 Difficulty in walking, not elsewhere classified: Secondary | ICD-10-CM

## 2020-11-20 NOTE — Therapy (Signed)
[place], [place], [address] Phone: [phone]   Fax:  [phone]  Physical Therapy Treatment  Patient Details  Name: Christian Parsons MRN: [id number] Date of Birth: [birth year] Referring Provider (PT): Thurman Coyer, DO   Encounter Date: 11/20/2020   PT End of Session - 11/20/20 1015    Visit Number 2    Number of Visits 7    Date for PT Re-Evaluation 01/06/21    Authorization Type MEDICARE PART A AND B    Authorization Time Period AARP    Progress Note Due on Visit 10    PT Start Time 1015    PT Stop Time 1059    PT Time Calculation (min) 44 min    Activity Tolerance Patient tolerated treatment well    Behavior During Therapy [facility] for tasks assessed/performed           Past Medical History:  Diagnosis Date  . Anxiety   . Bipolar disorder ([place])    H/O  . BPH with urinary obstruction    s/p TURP  . Chronic kidney disease   . Chronic renal insufficiency, stage 2 (mild)    secondary to lithium toxicity; baseline creatinine 1.3-1.6  . Coronary artery disease   . Depression   . Hypertension     Past Surgical History:  Procedure Laterality Date  . HERNIA REPAIR    . PROSTATE SURGERY    . TRANSURETHRAL RESECTION OF PROSTATE      There were no vitals filed for this visit.   Subjective Assessment - 11/20/20 1020    Subjective Pain is better. Decrease in walking has helped.    Limitations Walking    How long can you sit comfortably? no issue    How long can you stand comfortably? a long time    How long can you walk comfortably? 1 mile    Patient Stated Goals Pt is an avid hiker and would like to return to his previous level. Pt would hike 6 miles in 2 hours.    Currently in Pain? Yes    Pain Score 3     Pain Location Knee    Pain Orientation Right;Medial    Pain Descriptors / Indicators Aching;Sharp    Pain Type Acute pain              [facility] PT Assessment - 11/20/20 0001       Transfers   Five time sit to stand comments  18.93 seconds      Standardized Balance Assessment   Standardized Balance Assessment Timed Up and Go Test      Timed Up and Go Test   Normal TUG (seconds) 11.29      High Level Balance   High Level Balance Comments SLS Rt = 6 sec; Lt 17 sec with a lot of arm and body movement                         [facility] Adult PT Treatment/Exercise - 11/20/20 0001      Exercises   Exercises Knee/Hip      Knee/Hip Exercises: Aerobic   Stationary Bike L2 x 5 min++      Knee/Hip Exercises: Standing   Hip Extension Both;20 reps    SLS bil mutiple reps at counter    Other Standing Knee Exercises tandem stance balance at counter multiple reps      Knee/Hip Exercises: Seated   Long  [employer] Right;10 reps    Long [employer] Limitations add wt next time    Sit to [employer] 5 reps      Knee/Hip Exercises: Supine   Bridges 20 reps    Straight Leg Raises Right;10 reps    Straight Leg Raise with External Rotation Right;10 reps      Knee/Hip Exercises: Sidelying   Hip ABduction Right;20 reps                  PT Education - 11/20/20 1100    Education Details HEP progressed    Person(s) Educated Patient    Methods Explanation;Demonstration;Handout    Comprehension Verbalized understanding;Returned demonstration            PT Short Term Goals - 11/17/20 1515      PT SHORT TERM GOAL #1   Title Pt will be ind in an initail HEP    Baseline started on eval    Target Date 12/08/20      PT SHORT TERM GOAL #2   Title Pt will voice understanding of measures to assist in pain reduction and management    Status New    Target Date 12/08/20             PT Long Term Goals - 11/20/20 1113      PT LONG TERM GOAL #5   Title Improved 5x sit to stand to <= 12.6 seconds to decrease fall risk.    Baseline 18.93 sec    Status New      Additional Long Term Goals   Additional Long Term Goals Yes      PT LONG TERM GOAL #6   Title  Improved TUG score to <= 9.2 sec to decrease fall risk    Baseline 11.29    Status New      PT LONG TERM GOAL #7   Title Improved bil SLS to >= 10 sec with good core control to decrease fall risk    Status New                 Plan - 11/20/20 1108    Clinical Impression Statement Patient presents for first f/u visit after eval reporting less pain in the right knee. Balance was further assessed and pt scored 18.93 sec with 5x sit to stand and 11.29 on the TUG indicating he is a fall risk. Additionally his SLS on the right was 6 sec and left 17 sec. While the left SLS was better patient used a lot of arm and trunk motion to remain standing. Jamaury will benefit from skilled PT to address his balance deficits as well as his knee pain. He did well with therex with no complaint of increased pain in the right knee. He could feel it some with SLR with ER so it was not added to HEP at this time. Patient also demonstrated good core strength with plank doing 2x 30 sec.    Personal Factors and Comorbidities Age;Comorbidity 3+    Comorbidities See medical history    Examination-Activity Limitations Locomotion Level;Squat;Stairs    PT Frequency 1x / week    PT Duration 6 weeks    PT Treatment/Interventions ADLs/Self Care Home Management;Cryotherapy;Electrical Stimulation;Ultrasound;Moist Heat;Iontophoresis 4mg /ml Dexamethasone;Therapeutic activities;Therapeutic exercise;Functional mobility training;Balance training;Manual techniques;Patient/family education;Passive range of motion;Dry needling;Taping;Vasopneumatic Device    PT Next Visit Plan Review FOTO score, cotninue with LE strength, balance    Consulted and Agree with Plan of Care Patient  Patient will benefit from skilled therapeutic intervention in order to improve the following deficits and impairments:  Difficulty walking,Decreased range of motion,Decreased strength,Decreased balance,Pain,Decreased activity tolerance  Visit  Diagnosis: Right knee pain, unspecified chronicity  Difficulty in walking, not elsewhere classified  Decreased ROM of right knee     Problem List Patient Active Problem List   Diagnosis Date Noted  . Atherosclerosis of aorta ([place]) 11/18/2017  . Left sided sciatica 11/18/2017  . Pes planus 03/13/2017  . Knee pain 03/11/2017  . BPH with urinary obstruction 08/12/2016  . CKD (chronic kidney disease) stage 2 or early 3 07/11/2015  . Lithium nephropathy 03/16/2015  . Ventral hernia without obstruction or gangrene 02/17/2015  . Diastasis recti 12/22/2014  . Erectile dysfunction 12/22/2014  . Benign essential tremor 06/16/2013  . Migraine headache with aura 12/25/2011  . BMI 31.0-31.9,adult 12/25/2011  . Right bundle branch block 12/25/2011  . Mood disorder [facility])     Madelyn Flavors PT 11/20/2020, 12:08 PM  [facility] [address] [place], [place], [address] Phone: [phone]   Fax:  [phone]  Name: RYOTA TREECE MRN: [id number] Date of Birth: 03/25/1946

## 2020-11-20 NOTE — Patient Instructions (Signed)
Access Code: [email or web address] URL: [email or web address] Date: 11/20/2020 Prepared by: Almyra Free  Exercises Supine Straight Leg Raises - 1 x daily - 7 x weekly - 3 sets - 10 reps - 3 hold Sidelying Hip Abduction - 1 x daily - 7 x weekly - 3 sets - 10 reps - 3 hold Sit to Stand - 2 x daily - 7 x weekly - 1 sets - 10 reps Standing Hip Extension with Counter Support - 1 x daily - 7 x weekly - 3 sets - 10 reps - 3 hold Standing Single Leg Stance with Counter Support - 2 x daily - 7 x weekly - 5 reps - 1 sets - max hold Standing Tandem Balance with Unilateral Counter Support - 2 x daily - 7 x weekly - 1 sets - 5 reps - max hold Standard Plank - 2 x daily - 7 x weekly - 1 sets - 5 reps - max hold

## 2020-11-23 ENCOUNTER — Ambulatory Visit: Payer: Medicare Other | Admitting: Sports Medicine

## 2020-11-27 ENCOUNTER — Ambulatory Visit: Payer: Medicare Other | Admitting: Physical Therapy

## 2020-11-27 ENCOUNTER — Encounter: Payer: Self-pay | Admitting: Physical Therapy

## 2020-11-27 ENCOUNTER — Other Ambulatory Visit: Payer: Self-pay

## 2020-11-27 DIAGNOSIS — M25561 Pain in right knee: Secondary | ICD-10-CM

## 2020-11-27 DIAGNOSIS — M25661 Stiffness of right knee, not elsewhere classified: Secondary | ICD-10-CM

## 2020-11-27 DIAGNOSIS — R262 Difficulty in walking, not elsewhere classified: Secondary | ICD-10-CM

## 2020-11-27 NOTE — Therapy (Signed)
[place], [place], [address] Phone: [phone]   Fax:  [phone]  Physical Therapy Treatment  Patient Details  Name: Christian Parsons MRN: [id number] Date of Birth: 28-Jan-1946 Referring Provider (PT): Christian Parsons   Encounter Date: 11/27/2020   PT End of Session - 11/27/20 1446    Visit Number 3    Number of Visits 7    Date for PT Re-Evaluation 01/06/21    Authorization Type MEDICARE PART A AND B    Authorization Time Period AARP    PT Start Time 1447    PT Stop Time 1530    PT Time Calculation (min) 43 min    Activity Tolerance Patient tolerated treatment well    Behavior During Therapy [facility] for tasks assessed/performed           Past Medical History:  Diagnosis Date  . Anxiety   . Bipolar disorder ([place])    H/O  . BPH with urinary obstruction    s/p TURP  . Chronic kidney disease   . Chronic renal insufficiency, stage 2 (mild)    secondary to lithium toxicity; baseline creatinine 1.3-1.6  . Coronary artery disease   . Depression   . Hypertension     Past Surgical History:  Procedure Laterality Date  . HERNIA REPAIR    . PROSTATE SURGERY    . TRANSURETHRAL RESECTION OF PROSTATE      There were no vitals filed for this visit.   Subjective Assessment - 11/27/20 1450    Subjective I am back to 90% this is helping enormously.  Hiked 3 miles easy hike, which was good.    Currently in Pain? Yes    Pain Score 1     Pain Location Knee    Pain Orientation Right;Medial    Pain Descriptors / Indicators Discomfort    Pain Type Acute pain    Pain Onset More than a month ago    Pain Frequency Intermittent                [facility] Adult PT Treatment/Exercise - 11/27/20 0001      Knee/Hip Exercises: Stretches   Active Hamstring Stretch Both;3 reps;30 seconds      Knee/Hip Exercises: Aerobic   Nustep L6 UE and LE 6 min      Knee/Hip Exercises: Standing   Hip Abduction Both;10  reps;Knee bent;Knee straight    Hip Extension Both;20 reps    SLS static , up to 10-15 sec   multiple trials   Other Standing Knee Exercises tandem stance balance at counter multiple reps    Other Standing Knee Exercises and tandem walking x 3 passes x 10      Knee/Hip Exercises: Seated   Sit to Sand 10 reps   added 10 lbs     Knee/Hip Exercises: Supine   Bridges --    Straight Leg Raises Right;10 reps;Left    Straight Leg Raise with External Rotation Right;10 reps;Left                    PT Short Term Goals - 11/27/20 1457      PT SHORT TERM GOAL #1   Title Pt will be ind in an initail HEP    Status Achieved      PT SHORT TERM GOAL #2   Title Pt will voice understanding of measures to assist in pain reduction and management  PT Long Term Goals - 11/27/20 1502      PT LONG TERM GOAL #1   Title Pt will report a decrease in R knee pain with ADLs to 2/10 or less    Status Partially Met      PT LONG TERM GOAL #2   Title Pt will report a decrease in R knee pain to 4/10 with 1 hour of walking with or without hiking sticks    Status Partially Met      PT LONG TERM GOAL #3   Title Pt's FOTO score will improve to 71% functional ability    Status Partially Met      PT LONG TERM GOAL #4   Title Pt will be Ind in a final HEP to maintain or progress the achieved LOF    Status On-going      PT LONG TERM GOAL #5   Title Improved 5x sit to stand to <= 12.6 seconds to decrease fall risk.    Status On-going      PT LONG TERM GOAL #6   Title Improved TUG score to <= 9.2 sec to decrease fall risk    Status On-going      PT LONG TERM GOAL #7   Title Improved bil SLS to >= 10 sec with good core control to decrease fall risk    Status On-going                 Plan - 11/27/20 1511    Clinical Impression Statement Patient continues to Parsons well, feeling min knee pain overall despite increasing activity.  He shows balance deficits with SLS and tandem.   Discussed need to stretch hamstring especially after hiking.  He was receptive to recommendations for general exercise, how to integrate balance into daily life.  "Felt good" walking out of clinic.    PT Treatment/Interventions ADLs/Self Care Home Management;Cryotherapy;Electrical Stimulation;Ultrasound;Moist Heat;Iontophoresis 26m/ml Dexamethasone;Therapeutic activities;Therapeutic exercise;Functional mobility training;Balance training;Manual techniques;Patient/family education;Passive range of motion;Dry needling;Taping;Vasopneumatic Device    PT Next Visit Plan continue with LE strength, balance    PT Home Exercise Plan [email or web address]    Consulted and Agree with Plan of Care Patient           Patient will benefit from skilled therapeutic intervention in order to improve the following deficits and impairments:  Difficulty walking,Decreased range of motion,Decreased strength,Decreased balance,Pain,Decreased activity tolerance  Visit Diagnosis: Right knee pain, unspecified chronicity  Difficulty in walking, not elsewhere classified  Decreased ROM of right knee     Problem List Patient Active Problem List   Diagnosis Date Noted  . Atherosclerosis of aorta ([place] 11/18/2017  . Left sided sciatica 11/18/2017  . Pes planus 03/13/2017  . Knee pain 03/11/2017  . BPH with urinary obstruction 08/12/2016  . CKD (chronic kidney disease) stage 2 or early 3 07/11/2015  . Lithium nephropathy 03/16/2015  . Ventral hernia without obstruction or gangrene 02/17/2015  . Diastasis recti 12/22/2014  . Erectile dysfunction 12/22/2014  . Benign essential tremor 06/16/2013  . Migraine headache with aura 12/25/2011  . BMI 31.0-31.9,adult 12/25/2011  . Right bundle branch block 12/25/2011  . Mood disorder ([facility]     Christian Parsons 11/27/2020, 3:47 PM  [facility] [address]Phone: [phone]  Fax:  [phone] Name:  Christian CORBELLOMRN: [id number] of Birth: 712/24/1947 JRaeford Parsons PT 11/27/20 3:47 PM Phone: [phone]: [phone]

## 2020-12-05 ENCOUNTER — Other Ambulatory Visit: Payer: Self-pay

## 2020-12-05 ENCOUNTER — Ambulatory Visit: Payer: Medicare Other

## 2020-12-05 DIAGNOSIS — R262 Difficulty in walking, not elsewhere classified: Secondary | ICD-10-CM

## 2020-12-05 DIAGNOSIS — M25561 Pain in right knee: Secondary | ICD-10-CM

## 2020-12-05 DIAGNOSIS — M25661 Stiffness of right knee, not elsewhere classified: Secondary | ICD-10-CM

## 2020-12-05 NOTE — Therapy (Signed)
[place], [place], [address] Phone: [phone]   Fax:  [phone]  Physical Therapy Treatment  Patient Details  Name: Christian Parsons MRN: [id number] Date of Birth: February 27, 1946 Referring Provider (PT): Thurman Coyer, DO   Encounter Date: 12/05/2020   PT End of Session - 12/05/20 1657    Visit Number 4    Number of Visits 7    Date for PT Re-Evaluation 01/06/21    Authorization Type MEDICARE PART A AND B    Authorization Time Period AARP    Progress Note Due on Visit 10    PT Start Time 1533    PT Stop Time 1618    PT Time Calculation (min) 45 min    Activity Tolerance Patient tolerated treatment well    Behavior During Therapy [facility] for tasks assessed/performed           Past Medical History:  Diagnosis Date  . Anxiety   . Bipolar disorder ([place])    H/O  . BPH with urinary obstruction    s/p TURP  . Chronic kidney disease   . Chronic renal insufficiency, stage 2 (mild)    secondary to lithium toxicity; baseline creatinine 1.3-1.6  . Coronary artery disease   . Depression   . Hypertension     Past Surgical History:  Procedure Laterality Date  . HERNIA REPAIR    . PROSTATE SURGERY    . TRANSURETHRAL RESECTION OF PROSTATE      There were no vitals filed for this visit.   Subjective Assessment - 12/05/20 1559    Subjective Pt reports he is doing better. He reports hiking 44mles earlier today    Limitations Walking    Patient Stated Goals Pt is an avid hiker and would like to return to his previous level. Pt would hike 6 miles in 2 hours.    Currently in Pain? Yes    Pain Score 2     Pain Location Knee    Pain Orientation Right;Left;Medial    Pain Type Acute pain    Pain Onset More than a month ago    Pain Frequency Intermittent    Aggravating Factors  Sit t/f standing, inital walking, extended walking    Pain Relieving Factors Rest                              [facility] Adult PT Treatment/Exercise - 12/05/20 0001      Exercises   Exercises Knee/Hip      Knee/Hip Exercises: Stretches   Active Hamstring Stretch Both;3 reps;30 seconds      Knee/Hip Exercises: Seated   Long Arc [employer] Right;Left;2 sets;10 reps    Long Arc Quad Weight 4 lbs.      Knee/Hip Exercises: Supine   Straight Leg Raises Right;10 reps;Left;2 sets    Straight Leg Raises Limitations 4 lbs      Knee/Hip Exercises: Sidelying   Hip ABduction Right;20 reps;10 reps    Hip ABduction Limitations 4 lbs    Clams 15 reps      Manual Therapy   Manual Therapy Soft tissue mobilization    Soft tissue mobilization Cross friction massage to the medial                  PT Education - 12/05/20 1825    Education Details Management of frequency and and hiking idstance. Use of hiking sticks to decrease wear  and tear forces of his LEs abd for safety.    Person(s) Educated Patient    Methods Explanation    Comprehension Verbalized understanding            PT Short Term Goals - 11/27/20 1457      PT SHORT TERM GOAL #1   Title Pt will be ind in an initail HEP    Status Achieved      PT SHORT TERM GOAL #2   Title Pt will voice understanding of measures to assist in pain reduction and management    Status Achieved             PT Long Term Goals - 11/27/20 1502      PT LONG TERM GOAL #1   Title Pt will report a decrease in R knee pain with ADLs to 2/10 or less    Status Partially Met      PT LONG TERM GOAL #2   Title Pt will report a decrease in R knee pain to 4/10 with 1 hour of walking with or without hiking sticks    Status Partially Met      PT LONG TERM GOAL #3   Title Pt's FOTO score will improve to 71% functional ability    Status Partially Met      PT LONG TERM GOAL #4   Title Pt will be Ind in a final HEP to maintain or progress the achieved LOF    Status On-going      PT LONG TERM GOAL #5   Title Improved 5x sit to stand to <= 12.6 seconds to decrease  fall risk.    Status On-going      PT LONG TERM GOAL #6   Title Improved TUG score to <= 9.2 sec to decrease fall risk    Status On-going      PT LONG TERM GOAL #7   Title Improved bil SLS to >= 10 sec with good core control to decrease fall risk    Status On-going                 Plan - 12/05/20 1658    Clinical Impression Statement Pt is doing well with min bilat medial knee pain at a low level and increasing  activity with hiking. Advised pt not  to over do hiking with a day of rest, managing distance, and use of hiking sticks. Pt walked 5 miles today and is having medial knee pain bilat, 2/10. Pt presents with pes planus and and out toeing which may be contributing to the medial knee pain. Pt reports he has orthotics he uses in his hiking shoes. PT today provided cross friction massage to the medial aspects of both knees. Additionally, strengthening was completed for the knees and hips. Pt was encouraged to complete his HEP consistently daily or everyother day.    Personal Factors and Comorbidities Age;Comorbidity 3+    Comorbidities See medical history    Examination-Activity Limitations Locomotion Level;Squat;Stairs    Examination-Participation Restrictions Other    Stability/Clinical Decision Making Stable/Uncomplicated    Clinical Decision Making Low    Rehab Potential Good    PT Frequency 1x / week    PT Duration 6 weeks    PT Treatment/Interventions ADLs/Self Care Home Management;Cryotherapy;Electrical Stimulation;Ultrasound;Moist Heat;Iontophoresis 35m/ml Dexamethasone;Therapeutic activities;Therapeutic exercise;Functional mobility training;Balance training;Manual techniques;Patient/family education;Passive range of motion;Dry needling;Taping;Vasopneumatic Device    PT Next Visit Plan Assess pt's frequency and distance of hiking. Continue with LE strength, balance  PT Home Exercise Plan [email or web address]    Consulted and Agree with Plan of Care Patient           Patient  will benefit from skilled therapeutic intervention in order to improve the following deficits and impairments:  Difficulty walking,Decreased range of motion,Decreased strength,Decreased balance,Pain,Decreased activity tolerance  Visit Diagnosis: Right knee pain, unspecified chronicity  Difficulty in walking, not elsewhere classified  Decreased ROM of right knee     Problem List Patient Active Problem List   Diagnosis Date Noted  . Atherosclerosis of aorta ([place]) 11/18/2017  . Left sided sciatica 11/18/2017  . Pes planus 03/13/2017  . Knee pain 03/11/2017  . BPH with urinary obstruction 08/12/2016  . CKD (chronic kidney disease) stage 2 or early 3 07/11/2015  . Lithium nephropathy 03/16/2015  . Ventral hernia without obstruction or gangrene 02/17/2015  . Diastasis recti 12/22/2014  . Erectile dysfunction 12/22/2014  . Benign essential tremor 06/16/2013  . Migraine headache with aura 12/25/2011  . BMI 31.0-31.9,adult 12/25/2011  . Right bundle branch block 12/25/2011  . Mood disorder [facility])    Gar Ponto MS, PT 12/05/20 6:29 PM  [place] [facility] [address] [place], [place], [address] Phone: [phone]   Fax:  [phone]  Name: TRAVION KE MRN: [id number] Date of Birth: Jun 11, 1946

## 2020-12-06 ENCOUNTER — Ambulatory Visit: Payer: Medicare Other

## 2020-12-11 ENCOUNTER — Telehealth: Payer: Self-pay | Admitting: Registered Nurse

## 2020-12-11 NOTE — Telephone Encounter (Signed)
We can consider orthopedic referral for further imaging, joint injection, or other intervention, or pt can continue with conservative management with RICE method, mild exercise, stretching.  Thanks,  Denice Paradise

## 2020-12-11 NOTE — Telephone Encounter (Signed)
Pt called in asking when he needs to make a f/up appt, last appt was in Jan 2022. No f/up information was provided.  Please advise

## 2020-12-11 NOTE — Telephone Encounter (Signed)
Patient is calling because he was last seen in January about his knee when he had a fall. He states his physical therapy is about to run out, and was wondering what does he do next.

## 2020-12-11 NOTE — Telephone Encounter (Signed)
Pt is scheduled for an appt with [name] on 12/12/20

## 2020-12-12 ENCOUNTER — Other Ambulatory Visit: Payer: Self-pay

## 2020-12-12 ENCOUNTER — Encounter: Payer: Self-pay | Admitting: Registered Nurse

## 2020-12-12 ENCOUNTER — Ambulatory Visit (INDEPENDENT_AMBULATORY_CARE_PROVIDER_SITE_OTHER): Payer: Medicare Other | Admitting: Registered Nurse

## 2020-12-12 VITALS — HR 70 | Temp 98.0°F | Resp 18 | Ht 72.0 in | Wt 214.4 lb

## 2020-12-12 DIAGNOSIS — Z8639 Personal history of other endocrine, nutritional and metabolic disease: Secondary | ICD-10-CM

## 2020-12-12 DIAGNOSIS — Z130 Encounter for screening for diseases of the blood and blood-forming organs and certain disorders involving the immune mechanism: Secondary | ICD-10-CM

## 2020-12-12 DIAGNOSIS — Z1329 Encounter for screening for other suspected endocrine disorder: Secondary | ICD-10-CM | POA: Diagnosis not present

## 2020-12-12 DIAGNOSIS — F418 Other specified anxiety disorders: Secondary | ICD-10-CM

## 2020-12-12 DIAGNOSIS — Z13228 Encounter for screening for other metabolic disorders: Secondary | ICD-10-CM | POA: Diagnosis not present

## 2020-12-12 DIAGNOSIS — N182 Chronic kidney disease, stage 2 (mild): Secondary | ICD-10-CM | POA: Diagnosis not present

## 2020-12-12 DIAGNOSIS — Z13220 Encounter for screening for lipoid disorders: Secondary | ICD-10-CM

## 2020-12-12 DIAGNOSIS — I10 Essential (primary) hypertension: Secondary | ICD-10-CM

## 2020-12-12 DIAGNOSIS — G43109 Migraine with aura, not intractable, without status migrainosus: Secondary | ICD-10-CM | POA: Diagnosis not present

## 2020-12-12 LAB — COMPREHENSIVE METABOLIC PANEL
ALT: 21 U/L (ref 0–53)
AST: 19 U/L (ref 0–37)
Albumin: 4.4 g/dL (ref 3.5–5.2)
Alkaline Phosphatase: 73 U/L (ref 39–117)
BUN: 29 mg/dL — ABNORMAL HIGH (ref 6–23)
CO2: 30 mEq/L (ref 19–32)
Calcium: 9.8 mg/dL (ref 8.4–10.5)
Chloride: 104 mEq/L (ref 96–112)
Creatinine, Ser: 1.55 mg/dL — ABNORMAL HIGH (ref 0.40–1.50)
GFR: 44.37 mL/min — ABNORMAL LOW (ref >60.00)
Glucose, Bld: 104 mg/dL — ABNORMAL HIGH (ref 70–99)
Potassium: 5.2 mEq/L — ABNORMAL HIGH (ref 3.5–5.1)
Sodium: 139 mEq/L (ref 135–145)
Total Bilirubin: 0.6 mg/dL (ref 0.2–1.2)
Total Protein: 7.3 g/dL (ref 6.0–8.3)

## 2020-12-12 LAB — CBC WITH DIFFERENTIAL/PLATELET
Basophils Absolute: 0 10*3/uL (ref 0.0–0.1)
Basophils Relative: 0.7 % (ref 0.0–3.0)
Eosinophils Absolute: 0.1 10*3/uL (ref 0.0–0.7)
Eosinophils Relative: 2.1 % (ref 0.0–5.0)
HCT: 44.1 % (ref 39.0–52.0)
Hemoglobin: 15.3 g/dL (ref 13.0–17.0)
Lymphocytes Relative: 18.1 % (ref 12.0–46.0)
Lymphs Abs: 0.8 10*3/uL (ref 0.7–4.0)
MCHC: 34.7 g/dL (ref 30.0–36.0)
MCV: 94.3 fl (ref 78.0–100.0)
Monocytes Absolute: 0.4 10*3/uL (ref 0.1–1.0)
Monocytes Relative: 9.6 % (ref 3.0–12.0)
Neutro Abs: 3.1 10*3/uL (ref 1.4–7.7)
Neutrophils Relative %: 69.5 % (ref 43.0–77.0)
Platelets: 141 10*3/uL — ABNORMAL LOW (ref 150.0–400.0)
RBC: 4.67 Mil/uL (ref 4.22–5.81)
RDW: 13 % (ref 11.5–15.5)
WBC: 4.5 10*3/uL (ref 4.0–10.5)

## 2020-12-12 LAB — LIPID PANEL
Cholesterol: 134 mg/dL (ref 0–200)
HDL: 37.5 mg/dL — ABNORMAL LOW (ref >39.00)
LDL Cholesterol: 70 mg/dL (ref 0–99)
NonHDL: 96.7
Total CHOL/HDL Ratio: 4
Triglycerides: 133 mg/dL (ref 0.0–149.0)
VLDL: 26.6 mg/dL (ref 0.0–40.0)

## 2020-12-12 LAB — HEMOGLOBIN A1C: Hgb A1c MFr Bld: 5.6 % (ref 4.6–6.5)

## 2020-12-12 MED ORDER — LAMOTRIGINE 200 MG PO TABS: 200.0000 mg | ORAL_TABLET | Freq: Every day | ORAL | 3 refills | Status: DC

## 2020-12-12 MED ORDER — RIZATRIPTAN BENZOATE 10 MG PO TABS: ORAL_TABLET | ORAL | 4 refills | Status: DC

## 2020-12-12 MED ORDER — LORAZEPAM 0.5 MG PO TABS
0.5000 mg | ORAL_TABLET | ORAL | 0 refills | Status: AC | PRN
Start: 2020-12-12 — End: ?

## 2020-12-12 NOTE — Patient Instructions (Signed)
° ° ° °  If you have lab work done today you will be contacted with your lab results within the next 2 weeks.  If you have not heard from us then please contact us. The fastest way to get your results is to register for My Chart. ° ° °IF you received an x-ray today, you will receive an invoice from [facility]. Please contact [facility] at [phone] with questions or concerns regarding your invoice.  ° °IF you received labwork today, you will receive an invoice from [facility]. Please contact [facility] at [phone] with questions or concerns regarding your invoice.  ° °Our billing staff will not be able to assist you with questions regarding bills from these companies. ° °You will be contacted with the lab results as soon as they are available. The fastest way to get your results is to activate your My Chart account. Instructions are located on the last page of this paperwork. If you have not heard from us regarding the results in 2 weeks, please contact this office. °  ° ° ° °

## 2020-12-12 NOTE — Progress Notes (Signed)
Established Patient Office Visit  Subjective:  Patient ID: Christian Parsons, male    DOB: 1946/08/23  Age: 75 y.o. MRN: [id number]  CC:  Chief Complaint  Patient presents with  . Referral    Patient would like to discuss referral and also medication refill.    HPI Christian Parsons presents for med check and chart review  Needs refills on all meds  Depression with anxiety: Taking Lamictal 200mg  Po qd Has Lorazepam 0.5mg  PO qd PRN for breakthrough anxiety He unfortunately received an erroneous diagnosis of Bipolar Disorder around 45 years ago - multiple psychiatrists since have agreed that he does not fit this diagnosis, most recently Dr. Toy Care. Does not exhibit any manic behavior, depression well controlled with lamictal. Has not used lithium in quite some time - since 1970s.  He unfortunately has faced stigma related to his mental health and is hoping to rectify his records to show his actual diagnosis - depression with anxiety - rather than any inaccuracies.   Migraines: Takes Maxalt 10mg  Po qd PRN for migraine, good effect. Usually starts just with a tylenol. Rarely has to repeat Maxalt.  Migraines are mildly more frequent and more intense lately, occurring around 1-2 times each month, sometimes weekly. Maxalt still effective. Denies SNOOP red flags.  Past Medical History:  Diagnosis Date  . Anxiety   . BPH with urinary obstruction    s/p TURP  . Chronic kidney disease   . Chronic renal insufficiency, stage 2 (mild)    secondary to lithium toxicity; baseline creatinine 1.3-1.6  . Coronary artery disease   . Depression   . Hypertension     Past Surgical History:  Procedure Laterality Date  . HERNIA REPAIR    . PROSTATE SURGERY    . TRANSURETHRAL RESECTION OF PROSTATE      Family History  Problem Relation Age of Onset  . Cancer Mother        breast cancer  . Dementia Father   . Arthritis Sister        hip OA s/p hip replacement    Social History   Socioeconomic  History  . Marital status: Married    Spouse name: Not on file  . Number of children: 1  . Years of education: 16+  . Highest education level: Professional school degree (e.g., MD, DDS, DVM, JD)  Occupational History  . Occupation: retired    Comment: Music therapist  Tobacco Use  . Smoking status: Never Smoker  . Smokeless tobacco: Never Used  Substance and Sexual Activity  . Alcohol use: Yes    Alcohol/week: 2.0 standard drinks    Types: 2 Standard drinks or equivalent per week    Comment: Occasional  . Drug use: No  . Sexual activity: Yes    Partners: Female    Birth control/protection: Post-menopausal  Other Topics Concern  . Not on file  Social History Narrative   Marital status: married x 48 years      Children: 1 daughter (25yo Professor Wissam Resor), no grandchildren.       Lives: with wife/[name]      Employment: retired at age 59 years of life; [place] x 12 years      Tobacco: never      Alcohol: rare in 2018; special occasions only      Exercise: walks 4-5 days per week also hikes 4 days per week 1-7 hours per day.  Swimming at [employer].  Advanced Directives; YES; full code but no prolonged measures.      ADLs: independent with ADLs in 2018; drives.   Social Determinants of Health   Financial Resource Strain: Not on file  Food Insecurity: Not on file  Transportation Needs: Not on file  Physical Activity: Not on file  Stress: Not on file  Social Connections: Not on file  Intimate Partner Violence: Not on file    Outpatient Medications Prior to Visit  Medication Sig Dispense Refill  . aspirin EC 81 MG tablet Take 81 mg by mouth daily.    Marland Kitchen lamoTRIgine (LAMICTAL) 200 MG tablet Take 200 mg by mouth daily.    Marland Kitchen LORazepam (ATIVAN) 0.5 MG tablet Take 0.5 mg by mouth as needed.    . rizatriptan (MAXALT) 10 MG tablet TAKE 1 TABLET BY MOUTH AS NEEDED FOR MIGRAINE. MAY REPEAT IN 2 HOURS IF NEEDED 10 tablet 2  . methocarbamol (ROBAXIN)  500 MG tablet Take 1 tablet (500 mg total) by mouth every 8 (eight) hours as needed for muscle spasms. (Patient not taking: No sig reported) 60 tablet 0   No facility-administered medications prior to visit.    No Known Allergies  ROS Review of Systems Per hpi     Objective:    Physical Exam Constitutional:      General: He is not in acute distress.    Appearance: Normal appearance. He is normal weight. He is not ill-appearing, toxic-appearing or diaphoretic.  Cardiovascular:     Rate and Rhythm: Normal rate and regular rhythm.     Heart sounds: Normal heart sounds. No murmur heard. No friction rub. No gallop.   Pulmonary:     Effort: Pulmonary effort is normal. No respiratory distress.     Breath sounds: Normal breath sounds. No stridor. No wheezing, rhonchi or rales.  Chest:     Chest wall: No tenderness.  Neurological:     General: No focal deficit present.     Mental Status: He is alert and oriented to person, place, and time. Mental status is at baseline.  Psychiatric:        Mood and Affect: Mood normal.        Behavior: Behavior normal.        Thought Content: Thought content normal.        Judgment: Judgment normal.     Pulse 70   Temp 98 F (36.7 C) (Temporal)   Resp 18   Ht 6' (1.829 m)   Wt 214 lb 6.4 oz (97.3 kg)   SpO2 99%   BMI 29.08 kg/m  Wt Readings from Last 3 Encounters:  12/12/20 214 lb 6.4 oz (97.3 kg)  10/26/20 210 lb (95.3 kg)  09/26/20 212 lb (96.2 kg)     There are no preventive care reminders to display for this patient.  There are no preventive care reminders to display for this patient.  Lab Results  Component Value Date   TSH 1.006 08/25/2015   Lab Results  Component Value Date   WBC 5.1 03/27/2020   HGB 14.1 03/27/2020   HCT 40.6 03/27/2020   MCV 93 03/27/2020   PLT 153 06/04/2019   Lab Results  Component Value Date   NA 141 09/28/2019   K 4.5 09/28/2019   CO2 24 09/28/2019   GLUCOSE 92 09/28/2019   BUN 23  09/28/2019   CREATININE 1.53 (H) 09/28/2019   BILITOT 0.6 09/28/2019   ALKPHOS 85 09/28/2019   AST 20 09/28/2019   ALT  25 09/28/2019   PROT 6.8 09/28/2019   ALBUMIN 4.1 09/28/2019   CALCIUM 9.5 09/28/2019   Lab Results  Component Value Date   CHOL 124 03/27/2020   Lab Results  Component Value Date   HDL 38 (L) 03/27/2020   Lab Results  Component Value Date   LDLCALC 69 03/27/2020   Lab Results  Component Value Date   TRIG 87 03/27/2020   Lab Results  Component Value Date   CHOLHDL 3.3 03/27/2020   Lab Results  Component Value Date   HGBA1C 5.4 08/25/2015      Assessment & Plan:   Problem List Items Addressed This Visit      Cardiovascular and Mediastinum   Migraine headache with aura - Primary   Relevant Medications   lamoTRIgine (LAMICTAL) 200 MG tablet   rizatriptan (MAXALT) 10 MG tablet     Genitourinary   CKD (chronic kidney disease) stage 2 or early 3   Relevant Orders   CBC with Differential/Platelet    Other Visit Diagnoses    Lipid screening       Relevant Orders   Lipid panel   Screening for endocrine, metabolic and immunity disorder       Relevant Orders   Comprehensive metabolic panel   Hemoglobin A1c   CBC with Differential/Platelet   History of elevated glucose       Relevant Orders   Hemoglobin A1c   Depression with anxiety       Relevant Medications   lamoTRIgine (LAMICTAL) 200 MG tablet   LORazepam (ATIVAN) 0.5 MG tablet      Meds ordered this encounter  Medications  . lamoTRIgine (LAMICTAL) 200 MG tablet    Sig: Take 1 tablet (200 mg total) by mouth daily.    Dispense:  90 tablet    Refill:  3    Order Specific Question:   Supervising Provider    Answer:   Carlota Raspberry, JEFFREY R [[id number]]  . LORazepam (ATIVAN) 0.5 MG tablet    Sig: Take 1 tablet (0.5 mg total) by mouth as needed.    Dispense:  30 tablet    Refill:  0    Order Specific Question:   Supervising Provider    Answer:   Carlota Raspberry, JEFFREY R [[id number]]  . rizatriptan  (MAXALT) 10 MG tablet    Sig: TAKE 1 TABLET BY MOUTH AS NEEDED FOR MIGRAINE. MAY REPEAT IN 2 HOURS IF NEEDED    Dispense:  10 tablet    Refill:  4    Order Specific Question:   Supervising Provider    Answer:   Carlota Raspberry, JEFFREY R [[id number]]    Follow-up: No follow-ups on file.   PLAN  As always, Mr. Beigel continues to be remarkably pleasant and agreeable even in discussion regarding his frustrations with inaccuracies in his chart. I assured him that I will address these.   We will assume refills for his antidepressant medications from Psychiatry Dr. Toy Care, though pt understands that should changes need to be made, will consider referral back to specialist.  Labs collected. Will follow up with the patient as warranted.  Renal function stable on last few checks.   Patient encouraged to call clinic with any questions, comments, or concerns.  Maximiano Coss, NP

## 2020-12-13 ENCOUNTER — Ambulatory Visit: Payer: Medicare Other

## 2020-12-13 ENCOUNTER — Encounter: Payer: Self-pay | Admitting: Registered Nurse

## 2020-12-13 DIAGNOSIS — M25561 Pain in right knee: Secondary | ICD-10-CM | POA: Diagnosis not present

## 2020-12-13 DIAGNOSIS — M25661 Stiffness of right knee, not elsewhere classified: Secondary | ICD-10-CM

## 2020-12-13 DIAGNOSIS — R262 Difficulty in walking, not elsewhere classified: Secondary | ICD-10-CM

## 2020-12-13 NOTE — Therapy (Signed)
[place], [place], [address] Phone: [phone]   Fax:  [phone]  Physical Therapy Treatment  Patient Details  Name: Christian Parsons MRN: [id number] Date of Birth: 21-May-1946 Referring Provider (PT): Thurman Coyer, DO   Encounter Date: 12/13/2020   PT End of Session - 12/13/20 0920    Visit Number 5    Number of Visits 7    Date for PT Re-Evaluation 01/06/21    Authorization Type MEDICARE PART A AND B    Authorization Time Period A[employer]    Progress Note Due on Visit 10    PT Start Time 0918    PT Stop Time 1000    PT Time Calculation (min) 42 min    Activity Tolerance Patient tolerated treatment well    Behavior During Therapy [facility] for tasks assessed/performed           Past Medical History:  Diagnosis Date  . Anxiety   . BPH with urinary obstruction    s/p TURP  . Chronic kidney disease   . Chronic renal insufficiency, stage 2 (mild)    secondary to lithium toxicity; baseline creatinine 1.3-1.6  . Coronary artery disease   . Depression   . Hypertension     Past Surgical History:  Procedure Laterality Date  . HERNIA REPAIR    . PROSTATE SURGERY    . TRANSURETHRAL RESECTION OF PROSTATE      There were no vitals filed for this visit.   Subjective Assessment - 12/13/20 0924    Subjective Pt reports a set back c using 5 lb ankle weights which slid up and down his leg, but he rested the R leg and it is felling better. 2/10 pain when walking/hiking, 0/10 at rest. Pt states he has been using hiking sticks for support and balance    Limitations Walking    Patient Stated Goals Pt is an avid hiker and would like to return to his previous level. Pt would hike 6 miles in 2 hours.    Currently in Pain? Yes    Pain Score 2    0/10 at rest   Pain Location Knee    Pain Orientation Right    Pain Descriptors / Indicators Discomfort    Pain Type Acute pain    Pain Onset More than a month ago     Pain Frequency Intermittent              [facility] PT Assessment - 12/13/20 0001      Observation/Other Assessments   Focus on Therapeutic Outcomes (FOTO)  66%                         [facility] Adult PT Treatment/Exercise - 12/13/20 0001      Exercises   Exercises Knee/Hip;Ankle      Knee/Hip Exercises: Stretches   Active Hamstring Stretch Both;2 reps;30 seconds      Knee/Hip Exercises: Seated   Long Arc Quad Right;15 reps    Long Arc Quad Weight 2 lbs.    Sit to [employer] 15 reps      Knee/Hip Exercises: Supine   Straight Leg Raises Right;15 reps    Straight Leg Raises Limitations 2lbs    Straight Leg Raise with External Rotation Right;15 reps    Straight Leg Raise with External Rotation Limitations 2 lbs      Knee/Hip Exercises: Sidelying   Hip ABduction Right;15 reps    Hip  ABduction Limitations 2 lbs    Clams 15 reps   Red Tband     Ankle Exercises: Seated   Towel Crunch --   15x   Other Seated Ankle Exercises arch lifts 15x      Ankle Exercises: Supine   T-Band ankle Inv 15x, red Tband                  PT Education - 12/13/20 1301    Education Details Updated HEP for foot and ankle inversion strengthening.    Person(s) Educated Patient    Methods Explanation;Demonstration;Tactile cues;Verbal cues;Handout    Comprehension Verbalized understanding;Returned demonstration;Verbal cues required;Tactile cues required;Need further instruction            PT Short Term Goals - 11/27/20 1457      PT SHORT TERM GOAL #1   Title Pt will be ind in an initail HEP    Status Achieved      PT SHORT TERM GOAL #2   Title Pt will voice understanding of measures to assist in pain reduction and management    Status Achieved             PT Long Term Goals - 12/13/20 1311      PT LONG TERM GOAL #3   Title Pt's FOTO score will improve to 71% functional ability. 12/13/20- 66%    Baseline 61% functional ability    Status Partially Met    Target Date  01/06/21                 Plan - 12/13/20 4010    Clinical Impression Statement After a set back as described in subjective, pt continues report a positive recovery with his R knee which is consistent with his FOTO score of 66% reassessed today. PT continued strengthening of the R hip and knee. With pt's Hx of flat/pronated feet, toe flexion scrunches, arch lifts, and ankle inversion exs were completed and added to the pt's HEP. Pt reports he has started using hiking sticks and found them to be helpful. Pt will continue to benefit from PT for pain reduction and strengthening to optimize function of the R knee/LE.    Personal Factors and Comorbidities Age;Comorbidity 3+    Comorbidities See medical history    Examination-Activity Limitations Locomotion Level;Squat;Stairs    Examination-Participation Restrictions Other    Stability/Clinical Decision Making Stable/Uncomplicated    Clinical Decision Making Low    Rehab Potential Good    PT Frequency 1x / week    PT Duration 6 weeks    PT Treatment/Interventions ADLs/Self Care Home Management;Cryotherapy;Electrical Stimulation;Ultrasound;Moist Heat;Iontophoresis 79m/ml Dexamethasone;Therapeutic activities;Therapeutic exercise;Functional mobility training;Balance training;Manual techniques;Patient/family education;Passive range of motion;Dry needling;Taping;Vasopneumatic Device    PT Next Visit Plan Assess reponse to foot and ankle exs    PT Home Exercise Plan [id number]    Consulted and Agree with Plan of Care Patient           Patient will benefit from skilled therapeutic intervention in order to improve the following deficits and impairments:  Difficulty walking,Decreased range of motion,Decreased strength,Decreased balance,Pain,Decreased activity tolerance  Visit Diagnosis: Right knee pain, unspecified chronicity  Difficulty in walking, not elsewhere classified  Decreased ROM of right knee     Problem List Patient Active  Problem List   Diagnosis Date Noted  . Atherosclerosis of aorta ([place] 11/18/2017  . Left sided sciatica 11/18/2017  . Pes planus 03/13/2017  . Knee pain 03/11/2017  . BPH with urinary obstruction 08/12/2016  .  CKD (chronic kidney disease) stage 2 or early 3 07/11/2015  . Ventral hernia without obstruction or gangrene 02/17/2015  . Diastasis recti 12/22/2014  . Erectile dysfunction 12/22/2014  . Benign essential tremor 06/16/2013  . Migraine headache with aura 12/25/2011  . BMI 31.0-31.9,adult 12/25/2011  . Right bundle branch block 12/25/2011  . Mood disorder [facility])    Gar Ponto MS, PT 12/13/20 1:18 PM  [facility] [address] [place], [place], [address] Phone: [phone]   Fax:  [phone]  Name: BRITTIAN RENALDO MRN: [id number] Date of Birth: May 08, 1946

## 2020-12-20 ENCOUNTER — Other Ambulatory Visit: Payer: Self-pay

## 2020-12-20 ENCOUNTER — Ambulatory Visit: Payer: Medicare Other | Attending: Sports Medicine

## 2020-12-20 DIAGNOSIS — M25561 Pain in right knee: Secondary | ICD-10-CM | POA: Insufficient documentation

## 2020-12-20 DIAGNOSIS — M25661 Stiffness of right knee, not elsewhere classified: Secondary | ICD-10-CM | POA: Diagnosis present

## 2020-12-20 DIAGNOSIS — R262 Difficulty in walking, not elsewhere classified: Secondary | ICD-10-CM | POA: Diagnosis present

## 2020-12-20 NOTE — Therapy (Signed)
[place], [place], [address] Phone: [phone]   Fax:  [phone]  Physical Therapy Treatment/Discharge  Patient Details  Name: Christian Parsons MRN: [id number] Date of Birth: 09-06-1946 Referring Provider (PT): Thurman Coyer, DO   Encounter Date: 12/20/2020   PT End of Session - 12/20/20 0921    Visit Number 6    Number of Visits 7    Date for PT Re-Evaluation 01/06/21    Authorization Type MEDICARE PART A AND B    Authorization Time Period AARP    Progress Note Due on Visit 10    PT Start Time 0918    PT Stop Time 1000    PT Time Calculation (min) 42 min    Activity Tolerance Patient tolerated treatment well    Behavior During Therapy [facility] for tasks assessed/performed           Past Medical History:  Diagnosis Date  . Anxiety   . BPH with urinary obstruction    s/p TURP  . Chronic kidney disease   . Chronic renal insufficiency, stage 2 (mild)    secondary to lithium toxicity; baseline creatinine 1.3-1.6  . Coronary artery disease   . Depression   . Hypertension     Past Surgical History:  Procedure Laterality Date  . HERNIA REPAIR    . PROSTATE SURGERY    . TRANSURETHRAL RESECTION OF PROSTATE      There were no vitals filed for this visit.   Subjective Assessment - 12/20/20 0923    Subjective Pt is pleased with his progress hiking 5 miles yesterday experiencing 2/10 R knee pain. Pt is not experiencing pain today.    Patient Stated Goals Pt is an avid hiker and would like to return to his previous level. Pt would hike 6 miles in 2 hours.    Currently in Pain? No/denies    Pain Location Knee    Pain Orientation Right    Pain Descriptors / Indicators Discomfort    Pain Type Acute pain    Pain Onset More than a month ago    Pain Frequency Intermittent              [facility] PT Assessment - 12/20/20 0001      Observation/Other Assessments   Focus on Therapeutic Outcomes (FOTO)   78%      Transfers   Five time sit to stand comments  12.6 seconds      Standardized Balance Assessment   Standardized Balance Assessment Timed Up and Go Test      Timed Up and Go Test   Normal TUG (seconds) 7.4      High Level Balance   High Level Balance Comments SLS Rt = 13 sec; Lt 18 sec with a lot of arm and body movement                         [facility] Adult PT Treatment/Exercise - 12/20/20 0001      Exercises   Exercises Knee/Hip;Ankle      Knee/Hip Exercises: Stretches   Active Hamstring Stretch Right;1 rep;30 seconds      Knee/Hip Exercises: Standing   Knee Flexion Right;10 reps    Knee Flexion Limitations 2    Hip Abduction Right;10 reps    Abduction Limitations 2    Hip Extension Right;10 reps    Extension Limitations 2    Other Standing Knee Exercises tandem stance balance at  counter multiple reps      Knee/Hip Exercises: Seated   Long Arc Quad Right;10 reps    Long Arc Quad Weight 2 lbs.      Knee/Hip Exercises: Sidelying   Hip ABduction Right;10 reps    Hip ABduction Limitations 2#      Ankle Exercises: Seated   Towel Crunch Weights (lbs) 10c    Other Seated Ankle Exercises arch lifts 10x      Ankle Exercises: Supine   T-Band R ankle Inv 10x, red Tband                  PT Education - 12/20/20 1012    Education Details Finl HEP    Person(s) Educated Patient    Methods Explanation;Demonstration    Comprehension Verbalized understanding;Returned demonstration            PT Short Term Goals - 11/27/20 1457      PT SHORT TERM GOAL #1   Title Pt will be ind in an initail HEP    Status Achieved      PT SHORT TERM GOAL #2   Title Pt will voice understanding of measures to assist in pain reduction and management    Status Achieved             PT Long Term Goals - 12/20/20 0951      PT LONG TERM GOAL #1   Title Pt will report a decrease in R knee pain with ADLs to 2/10 or less. 12/20/20- 0/10    Status Achieved     Target Date 12/20/20      PT LONG TERM GOAL #2   Title Pt will report a decrease in R knee pain to 4/10 with 1 hour of walking with or without hiking sticks. 12/20/20- 2/10 c 5 mile hike    Baseline 7/10    Status Achieved    Target Date 12/20/20      PT LONG TERM GOAL #3   Title Pt's FOTO score will improve to 71% functional ability. 12/13/20- 66%. 12/20/20-78%    Status Achieved    Target Date 12/20/20      PT LONG TERM GOAL #4   Title Pt will be Ind in a final HEP to maintain or progress the achieved LOF    Status Achieved    Target Date 12/20/20      PT LONG TERM GOAL #5   Title Improved 5x sit to stand to <= 12.6 seconds to decrease fall risk. 12/20/20-11.6 sec    Baseline 18.93 sec    Status New    Target Date 12/20/20      PT LONG TERM GOAL #6   Title Improved TUG score to <= 9.2 sec to decrease fall risk. 12/20/20-7.4 sec    Baseline 11.29    Status Achieved    Target Date 12/20/20      PT LONG TERM GOAL #7   Title Improved bil SLS to >= 10 sec with good core control to decrease fall risk. 12/20/20-L 15, R 13    Status Achieved                 Plan - 12/20/20 1013    Clinical Impression Statement Pt has made good progress with PT progressing to being able to hike previous distances c only min R knee pain, and experiencing no R knee pain with usual daily activities. All functional measures, indicating improved strength and balance, have improved. All set goals were met  and pt is Ind c a HEP. Pt is in agreement with DC from PT services.    Personal Factors and Comorbidities Age;Comorbidity 3+    Comorbidities See medical history    Examination-Activity Limitations Locomotion Level;Squat;Stairs    Examination-Participation Restrictions Other    Stability/Clinical Decision Making Stable/Uncomplicated    Clinical Decision Making Low    Rehab Potential Good    PT Frequency 1x / week    PT Duration 6 weeks    PT Treatment/Interventions ADLs/Self Care Home  Management;Cryotherapy;Electrical Stimulation;Ultrasound;Moist Heat;Iontophoresis 53m/ml Dexamethasone;Therapeutic activities;Therapeutic exercise;Functional mobility training;Balance training;Manual techniques;Patient/family education;Passive range of motion;Dry needling;Taping;Vasopneumatic Device    PT Home Exercise Plan [id number]          Patient will benefit from skilled therapeutic intervention in order to improve the following deficits and impairments:  Difficulty walking,Decreased range of motion,Decreased strength,Decreased balance,Pain,Decreased activity tolerance  Visit Diagnosis: Right knee pain, unspecified chronicity  Difficulty in walking, not elsewhere classified  Decreased ROM of right knee     Problem List Patient Active Problem List   Diagnosis Date Noted  . Atherosclerosis of aorta ([place] 11/18/2017  . Left sided sciatica 11/18/2017  . Pes planus 03/13/2017  . Knee pain 03/11/2017  . BPH with urinary obstruction 08/12/2016  . CKD (chronic kidney disease) stage 2 or early 3 07/11/2015  . Ventral hernia without obstruction or gangrene 02/17/2015  . Diastasis recti 12/22/2014  . Erectile dysfunction 12/22/2014  . Benign essential tremor 06/16/2013  . Migraine headache with aura 12/25/2011  . BMI 31.0-31.9,adult 12/25/2011  . Right bundle branch block 12/25/2011  . Mood disorder ([place]      PHYSICAL THERAPY DISCHARGE SUMMARY  Visits from Start of Care: 6  Current functional level related to goals / functional outcomes: See above  Remaining deficits: See above   Education / Equipment: HEP Plan: Patient agrees to discharge.  Patient goals were met. Patient is being discharged due to meeting the stated rehab goals.  ?????          [place] [address]Phone: [phone]  Fax:  [phone] Name: Christian ZETINAMRN: [id number] of Birth: [birth year]   AGar PontoMS, PT 12/20/20 10:29 AM

## 2020-12-27 ENCOUNTER — Ambulatory Visit: Payer: Medicare Other

## 2021-02-26 ENCOUNTER — Ambulatory Visit: Payer: Medicare Other | Admitting: *Deleted

## 2021-02-26 DIAGNOSIS — Z0000 Encounter for general adult medical examination without abnormal findings: Secondary | ICD-10-CM | POA: Diagnosis not present

## 2021-02-26 NOTE — Patient Instructions (Signed)
Christian Parsons , Thank you for taking time to come for your Medicare Wellness Visit. I appreciate your ongoing commitment to your health goals. Please review the following plan we discussed and let me know if I can assist you in the future.   Screening recommendations/referrals: Colonoscopy: Patient will call to reschedule Recommended yearly ophthalmology/optometry visit for glaucoma screening and checkup Recommended yearly dental visit for hygiene and checkup  Vaccinations: Influenza vaccine: up to date Pneumococcal vaccine: up to date Tdap vaccine: up to date Shingles vaccine: shingrix shot 1 of 2 completed    Advanced directives: yes copy requested   Conditions/risks identified: na  Next appointment: 02-14-2022 @ 11:15  Preventive Care 33 Years and Older, Male Preventive care refers to lifestyle choices and visits with your health care provider that can promote health and wellness. What does preventive care include? A yearly physical exam. This is also called an annual well check. Dental exams once or twice a year. Routine eye exams. Ask your health care provider how often you should have your eyes checked. Personal lifestyle choices, including: Daily care of your teeth and gums. Regular physical activity. Eating a healthy diet. Avoiding tobacco and drug use. Limiting alcohol use. Practicing safe sex. Taking low doses of aspirin every day. Taking vitamin and mineral supplements as recommended by your health care provider. What happens during an annual well check? The services and screenings done by your health care provider during your annual well check will depend on your age, overall health, lifestyle risk factors, and family history of disease. Counseling  Your health care provider may ask you questions about your: Alcohol use. Tobacco use. Drug use. Emotional well-being. Home and relationship well-being. Sexual activity. Eating habits. History of falls. Memory and  ability to understand (cognition). Work and work Statistician. Screening  You may have the following tests or measurements: Height, weight, and BMI. Blood pressure. Lipid and cholesterol levels. These may be checked every 5 years, or more frequently if you are over 68 years old. Skin check. Lung cancer screening. You may have this screening every year starting at age 76 if you have a 30-pack-year history of smoking and currently smoke or have quit within the past 15 years. Fecal occult blood test (FOBT) of the stool. You may have this test every year starting at age 8. Flexible sigmoidoscopy or colonoscopy. You may have a sigmoidoscopy every 5 years or a colonoscopy every 10 years starting at age 8. Prostate cancer screening. Recommendations will vary depending on your family history and other risks. Hepatitis C blood test. Hepatitis B blood test. Sexually transmitted disease (STD) testing. Diabetes screening. This is done by checking your blood sugar (glucose) after you have not eaten for a while (fasting). You may have this done every 1-3 years. Abdominal aortic aneurysm (AAA) screening. You may need this if you are a current or former smoker. Osteoporosis. You may be screened starting at age 43 if you are at high risk. Talk with your health care provider about your test results, treatment options, and if necessary, the need for more tests. Vaccines  Your health care provider may recommend certain vaccines, such as: Influenza vaccine. This is recommended every year. Tetanus, diphtheria, and acellular pertussis (Tdap, Td) vaccine. You may need a Td booster every 10 years. Zoster vaccine. You may need this after age 32. Pneumococcal 13-valent conjugate (PCV13) vaccine. One dose is recommended after age 84. Pneumococcal polysaccharide (PPSV23) vaccine. One dose is recommended after age 68. Talk to your health  care provider about which screenings and vaccines you need and how often you need  them. This information is not intended to replace advice given to you by your health care provider. Make sure you discuss any questions you have with your health care provider. Document Released: 09/29/2015 Document Revised: 05/22/2016 Document Reviewed: 07/04/2015 Elsevier Interactive Patient Education  2017 [employer] Prevention in the Home Falls can cause injuries. They can happen to people of all ages. There are many things you can do to make your home safe and to help prevent falls. What can I do on the outside of my home? Regularly fix the edges of walkways and driveways and fix any cracks. Remove anything that might make you trip as you walk through a door, such as a raised step or threshold. Trim any bushes or trees on the path to your home. Use bright outdoor lighting. Clear any walking paths of anything that might make someone trip, such as rocks or tools. Regularly check to see if handrails are loose or broken. Make sure that both sides of any steps have handrails. Any raised decks and porches should have guardrails on the edges. Have any leaves, snow, or ice cleared regularly. Use sand or salt on walking paths during winter. Clean up any spills in your garage right away. This includes oil or grease spills. What can I do in the bathroom? Use night lights. Install grab bars by the toilet and in the tub and shower. Do not use towel bars as grab bars. Use non-skid mats or decals in the tub or shower. If you need to sit down in the shower, use a plastic, non-slip stool. Keep the floor dry. Clean up any water that spills on the floor as soon as it happens. Remove soap buildup in the tub or shower regularly. Attach bath mats securely with double-sided non-slip rug tape. Do not have throw rugs and other things on the floor that can make you trip. What can I do in the bedroom? Use night lights. Make sure that you have a light by your bed that is easy to reach. Do not use  any sheets or blankets that are too big for your bed. They should not hang down onto the floor. Have a firm chair that has side arms. You can use this for support while you get dressed. Do not have throw rugs and other things on the floor that can make you trip. What can I do in the kitchen? Clean up any spills right away. Avoid walking on wet floors. Keep items that you use a lot in easy-to-reach places. If you need to reach something above you, use a strong step stool that has a grab bar. Keep electrical cords out of the way. Do not use floor polish or wax that makes floors slippery. If you must use wax, use non-skid floor wax. Do not have throw rugs and other things on the floor that can make you trip. What can I do with my stairs? Do not leave any items on the stairs. Make sure that there are handrails on both sides of the stairs and use them. Fix handrails that are broken or loose. Make sure that handrails are as long as the stairways. Check any carpeting to make sure that it is firmly attached to the stairs. Fix any carpet that is loose or worn. Avoid having throw rugs at the top or bottom of the stairs. If you do have throw rugs, attach them to  the floor with carpet tape. Make sure that you have a light switch at the top of the stairs and the bottom of the stairs. If you do not have them, ask someone to add them for you. What else can I do to help prevent falls? Wear shoes that: Do not have high heels. Have rubber bottoms. Are comfortable and fit you well. Are closed at the toe. Do not wear sandals. If you use a stepladder: Make sure that it is fully opened. Do not climb a closed stepladder. Make sure that both sides of the stepladder are locked into place. Ask someone to hold it for you, if possible. Clearly mark and make sure that you can see: Any grab bars or handrails. First and last steps. Where the edge of each step is. Use tools that help you move around (mobility aids)  if they are needed. These include: Canes. Walkers. Scooters. Crutches. Turn on the lights when you go into a dark area. Replace any light bulbs as soon as they burn out. Set up your furniture so you have a clear path. Avoid moving your furniture around. If any of your floors are uneven, fix them. If there are any pets around you, be aware of where they are. Review your medicines with your doctor. Some medicines can make you feel dizzy. This can increase your chance of falling. Ask your doctor what other things that you can do to help prevent falls. This information is not intended to replace advice given to you by your health care provider. Make sure you discuss any questions you have with your health care provider. Document Released: 06/29/2009 Document Revised: 02/08/2016 Document Reviewed: 10/07/2014 Elsevier Interactive Patient Education  2017 [employer].

## 2021-02-26 NOTE — Progress Notes (Signed)
Subjective:   Christian Parsons is a 75 y.o. male who presents for Medicare Annual/Subsequent preventive examination.  I connected with  Christian Parsons on 02/26/21 by a telephone enabled telemedicine application and verified that I am speaking with the correct person using two identifiers.   I discussed the limitations of evaluation and management by telemedicine. The patient expressed understanding and agreed to proceed.  Patient location: home  Provider location:  telephone  I provided 30 minutes of non face - to - face time during this encounter.   Review of Systems     NA Cardiac Risk Factors include: advanced age (>65men, >95 women);male gender     Objective:    Today's Vitals   There is no height or weight on file to calculate BMI.  Advanced Directives 02/26/2021 11/17/2020 08/05/2017 06/10/2017 04/24/2017 08/21/2016  Does Patient Have a Medical Advance Directive? Yes Yes Yes No Yes No  Type of Advance Directive Living will;Healthcare Power of [place];Living will [place];Living will - [place];Living will -  Does patient want to make changes to medical advance directive? - No - Patient declined - - - -  Copy of [place] in Chart? No - copy requested No - copy requested No - copy requested - - -    Current Medications (verified) Outpatient Encounter Medications as of 02/26/2021  Medication Sig   aspirin EC 81 MG tablet Take 81 mg by mouth daily.   lamoTRIgine (LAMICTAL) 200 MG tablet Take 1 tablet (200 mg total) by mouth daily.   LORazepam (ATIVAN) 0.5 MG tablet Take 1 tablet (0.5 mg total) by mouth as needed.   rizatriptan (MAXALT) 10 MG tablet TAKE 1 TABLET BY MOUTH AS NEEDED FOR MIGRAINE. MAY REPEAT IN 2 HOURS IF NEEDED   methocarbamol (ROBAXIN) 500 MG tablet Take 1 tablet (500 mg total) by mouth every 8 (eight) hours as needed for muscle spasms. (Patient not taking: No sig reported)   No  facility-administered encounter medications on file as of 02/26/2021.    Allergies (verified) Patient has no known allergies.   History: Past Medical History:  Diagnosis Date   Anxiety    BPH with urinary obstruction    s/p TURP   Chronic kidney disease    Chronic renal insufficiency, stage 2 (mild)    secondary to lithium toxicity; baseline creatinine 1.3-1.6   Coronary artery disease    Depression    Hypertension    Past Surgical History:  Procedure Laterality Date   HERNIA REPAIR     PROSTATE SURGERY     TRANSURETHRAL RESECTION OF PROSTATE     Family History  Problem Relation Age of Onset   Cancer Mother        breast cancer   Dementia Father    Arthritis Sister        hip OA s/p hip replacement   Social History   Socioeconomic History   Marital status: Married    Spouse name: Not on file   Number of children: 1   Years of education: 16+   Highest education level: Professional school degree (e.g., MD, DDS, DVM, JD)  Occupational History   Occupation: retired    Comment: Music therapist  Tobacco Use   Smoking status: Never   Smokeless tobacco: Never  Substance and Sexual Activity   Alcohol use: Yes    Alcohol/week: 2.0 standard drinks    Types: 2 Standard drinks or equivalent per  week    Comment: Occasional   Drug use: No   Sexual activity: Yes    Partners: Female    Birth control/protection: Post-menopausal  Other Topics Concern   Not on file  Social History Narrative   Marital status: married x 48 years      Children: 1 daughter (58yo Professor Christian Parsons), no grandchildren.       Lives: with wife/Christian Parsons      Employment: retired at age 88 years of life; [employer] x 12 years      Tobacco: never      Alcohol: rare in 2018; special occasions only      Exercise: walks 4-5 days per week also hikes 4 days per week 1-7 hours per day.  Swimming at [employer].       Advanced Directives; YES; full code but no prolonged measures.       ADLs: independent with ADLs in 2018; drives.   Social Determinants of Health   Financial Resource Strain: Low Risk    Difficulty of Paying Living Expenses: Not hard at all  Food Insecurity: No Food Insecurity   Worried About Charity fundraiser in the Last Year: Never true   [place] in the Last Year: Never true  Transportation Needs: No Transportation Needs   Lack of Transportation (Medical): No   Lack of Transportation (Non-Medical): No  Physical Activity: Sufficiently Active   Days of Exercise per Week: 5 days   Minutes of Exercise per Session: 60 min  Stress: No Stress Concern Present   Feeling of Stress : Not at all  Social Connections: Socially Integrated   Frequency of Communication with Friends and Family: More than three times a week   Frequency of Social Gatherings with Friends and Family: More than three times a week   Attends Religious Services: More than 4 times per year   Active Member of [employer] or Organizations: Yes   Attends Music therapist: More than 4 times per year   Marital Status: Married    Tobacco Counseling Counseling given: Not Answered   Clinical Intake:  Pre-visit preparation completed: Yes  Pain : 0-10 Pain Location: Knee Pain Orientation: Left, Right Pain Descriptors / Indicators: Aching Pain Onset: Today Effect of Pain on Daily Activities: no did a 6 mile hike today     Nutritional Risks: None Diabetes: No  How often do you need to have someone help you when you read instructions, pamphlets, or other written materials from your doctor or pharmacy?: 1 - Never  Diabetic?  NO     Information entered by :: Leroy Kennedy LPN   Activities of Daily Living In your present state of health, do you have any difficulty performing the following activities: 02/26/2021  Hearing? N  Vision? N  Difficulty concentrating or making decisions? N  Walking or climbing stairs? N  Dressing or bathing? N  Doing errands, shopping? N   Preparing Food and eating ? N  Using the Toilet? N  In the past six months, have you accidently leaked urine? N  Do you have problems with loss of bowel control? N  Managing your Medications? N  Managing your Finances? N  Housekeeping or managing your Housekeeping? N  Some recent data might be hidden    Patient Care Team: Maximiano Coss, NP as PCP - General (Adult Health Nurse Practitioner) Jerline Pain, MD as PCP - Cardiology (Cardiology) Melissa Noon, Ackerman as Referring Physician (Optometry)  Indicate  any recent Medical Services you may have received from other than [facility] providers in the past year (date may be approximate).     Assessment:   This is a routine wellness examination for Jazion.  Hearing/Vision screen Hearing Screening - Comments:: No trouble hearing Vision Screening - Comments:: Dr. Valrie Hart Had cataract surgery in both eyes 6 months ago  Dietary issues and exercise activities discussed: Current Exercise Habits: Home exercise routine, Type of exercise: walking;Other - see comments (hiking), Time (Minutes): 60, Frequency (Times/Week): 5, Weekly Exercise (Minutes/Week): 300, Intensity: Moderate   Goals Addressed             This Visit's Progress    Exercise 3x per week (30 min per time)   On track    Patient states that he wants to get back to walking on a daily basis.       Patient Stated       Would like to get back to moderate hiking        Depression Screen PHQ 2/9 Scores 02/26/2021 12/12/2020 09/26/2020 03/27/2020 09/28/2019 06/04/2019 02/11/2018  PHQ - 2 Score 0 0 0 0 0 0 0  Exception Documentation - - - - - - -    Fall Risk Fall Risk  02/26/2021 12/12/2020 09/26/2020 03/27/2020 09/28/2019  Falls in the past year? 1 0 0 0 0  Number falls in past yr: 0 0 0 0 -  Comment knee / went to physical therapy - - - -  Injury with Fall? 1 0 0 0 -  Risk for fall due to : - - No Fall Risks - -  Follow up Falls evaluation completed;Falls prevention  discussed Falls evaluation completed Falls evaluation completed Falls evaluation completed Falls evaluation completed    FALL RISK PREVENTION PERTAINING TO THE HOME:  Any stairs in or around the home? Yes  If so, are there any without handrails? Yes  Home free of loose throw rugs in walkways, pet beds, electrical cords, etc? Yes  Adequate lighting in your home to reduce risk of falls? Yes   ASSISTIVE DEVICES UTILIZED TO PREVENT FALLS:  Life alert? No  Use of a cane, walker or w/c? No  Grab bars in the bathroom? Yes  Shower chair or bench in shower? No  Elevated toilet seat or a handicapped toilet? No   TIMED UP AND GO:  Was the test performed? No .      Cognitive Function:   Normal cognitive status assessed by direct observation by this Nurse Health Advisor. No abnormalities found.     6CIT Screen 08/05/2017  What Year? 0 points  What month? 0 points  What time? 0 points  Count back from 20 0 points  Months in reverse 0 points  Repeat phrase 0 points  Total Score 0    Immunizations Immunization History  Administered Date(s) Administered   Hep A / Hep B 01/30/2004, 03/01/2004   Influenza Split 07/24/2012   Influenza, High Dose Seasonal PF 06/16/2013, 05/25/2014, 06/18/2015, 07/30/2016, 06/03/2017, 05/20/2018, 05/07/2019   Influenza,inj,Quad PF,6+ Mos 06/16/2013, 05/25/2014, 06/18/2015, 07/30/2016, 06/03/2017   Influenza-Unspecified 01/30/2004, 05/20/2018, 07/05/2020   PFIZER(Purple Top)SARS-COV-2 Vaccination 10/23/2019, 11/17/2019, 05/19/2020   Pneumococcal Conjugate-13 07/30/2016   Pneumococcal Polysaccharide-23 11/07/2010, 08/05/2017   Td 01/30/2004   Tdap 06/16/2013   Typhoid Inactivated 01/30/2004    TDAP status: Up to date  Flu Vaccine status: Up to date  Pneumococcal vaccine status: Up to date  Covid-19 vaccine status: Completed vaccines  Qualifies for Shingles Vaccine?  Yes   Zostavax completed No   Shingrix Completed?: No.    Education has been  provided regarding the importance of this vaccine. Patient has been advised to call insurance company to determine out of pocket expense if they have not yet received this vaccine. Advised may also receive vaccine at local pharmacy or Health Dept. Verbalized acceptance and understanding.  Screening Tests Health Maintenance  Topic Date Due   Zoster Vaccines- Shingrix (1 of 2) Never done   COVID-19 Vaccine (4 - Booster for Pfizer series) 09/18/2020   INFLUENZA VACCINE  04/16/2021   TETANUS/TDAP  06/17/2023   COLONOSCOPY (Pts 45-57yrs Insurance coverage will need to be confirmed)  02/15/2024   Hepatitis C Screening  Completed   PNA vac Low Risk Adult  Completed   HPV VACCINES  Aged Out    Health Maintenance  Health Maintenance Due  Topic Date Due   Zoster Vaccines- Shingrix (1 of 2) Never done   COVID-19 Vaccine (4 - Booster for Pfizer series) 09/18/2020   Colonoscopy due patient will call to schedule. Cancelled previous appointment.   Lung Cancer Screening: (Low Dose CT Chest recommended if Age 77-80 years, 30 pack-year currently smoking OR have quit w/in 15years.) does not qualify.   Lung Cancer Screening Referral:  na  Additional Screening:  Hepatitis C Screening: does not qualify; Completed 07-25-2016  Vision Screening: Recommended annual ophthalmology exams for early detection of glaucoma and other disorders of the eye. Is the patient up to date with their annual eye exam?  Yes  Who is the provider or what is the name of the office in which the patient attends annual eye exams? Dr. Keene Breath  If pt is not established with a provider, would they like to be referred to a provider to establish care? No .   Dental Screening: Recommended annual dental exams for proper oral hygiene  Community Resource Referral / Chronic Care Management: CRR required this visit?  No   CCM required this visit?  no     Plan:     I have personally reviewed and noted the following in the patient's  chart:   Medical and social history Use of alcohol, tobacco or illicit drugs  Current medications and supplements including opioid prescriptions. Patient is not currently taking opioid prescriptions. Functional ability and status Nutritional status Physical activity Advanced directives List of other physicians Hospitalizations, surgeries, and ER visits in previous 12 months Vitals Screenings to include cognitive, depression, and falls Referrals and appointments  In addition, I have reviewed and discussed with patient certain preventive protocols, quality metrics, and best practice recommendations. A written personalized care plan for preventive services as well as general preventive health recommendations were provided to patient.     Leroy Kennedy, LPN   [id number]   Nurse Notes: na

## 2021-03-08 ENCOUNTER — Ambulatory Visit: Payer: Medicare Other | Attending: Internal Medicine

## 2021-03-08 ENCOUNTER — Other Ambulatory Visit: Payer: Self-pay | Admitting: Registered Nurse

## 2021-03-08 ENCOUNTER — Telehealth: Payer: Self-pay

## 2021-03-08 DIAGNOSIS — G43109 Migraine with aura, not intractable, without status migrainosus: Secondary | ICD-10-CM

## 2021-03-08 DIAGNOSIS — Z20822 Contact with and (suspected) exposure to covid-19: Secondary | ICD-10-CM

## 2021-03-08 MED ORDER — NARATRIPTAN HCL 2.5 MG PO TABS: 2.5000 mg | ORAL_TABLET | ORAL | 0 refills | Status: DC | PRN

## 2021-03-08 NOTE — Telephone Encounter (Signed)
Sent naratriptan  Thank you  Rich

## 2021-03-08 NOTE — Telephone Encounter (Signed)
Patient states that Christian Parsons suggested him to stop Christian Parsons and try a different med.  Patient states he did not want to change at the time but is now wanting to do so.  Please advise is appt is needed.  Patient also would like [facility] correct [facility] on file.

## 2021-03-09 LAB — SARS-COV-2, NAA 2 DAY TAT

## 2021-03-09 LAB — NOVEL CORONAVIRUS, NAA: SARS-CoV-2, NAA: NOT DETECTED

## 2021-03-14 ENCOUNTER — Other Ambulatory Visit: Payer: Self-pay

## 2021-03-14 ENCOUNTER — Telehealth: Payer: Self-pay | Admitting: Registered Nurse

## 2021-03-14 DIAGNOSIS — G43109 Migraine with aura, not intractable, without status migrainosus: Secondary | ICD-10-CM

## 2021-03-14 MED ORDER — NARATRIPTAN HCL 2.5 MG PO TABS: 2.5000 mg | ORAL_TABLET | ORAL | 0 refills | Status: DC | PRN

## 2021-03-14 NOTE — Telephone Encounter (Signed)
[facility] [address] - Please change his pharmacy to only - [facility] [address]. On 03/08/2021 the Naratriptin was sent to the wrong pharmacy - [facility]

## 2021-03-14 NOTE — Telephone Encounter (Signed)
Medication sent to preferred pharmacy. Chart updated.

## 2021-05-08 ENCOUNTER — Ambulatory Visit (INDEPENDENT_AMBULATORY_CARE_PROVIDER_SITE_OTHER): Payer: Medicare Other | Admitting: Neurology

## 2021-05-08 ENCOUNTER — Telehealth: Payer: Self-pay | Admitting: Registered Nurse

## 2021-05-08 ENCOUNTER — Other Ambulatory Visit: Payer: Self-pay

## 2021-05-08 ENCOUNTER — Encounter: Payer: Self-pay | Admitting: Neurology

## 2021-05-08 VITALS — BP 143/82 | HR 93 | Ht 72.0 in | Wt 219.5 lb

## 2021-05-08 DIAGNOSIS — R251 Tremor, unspecified: Secondary | ICD-10-CM

## 2021-05-08 DIAGNOSIS — F4489 Other dissociative and conversion disorders: Secondary | ICD-10-CM | POA: Diagnosis not present

## 2021-05-08 NOTE — Progress Notes (Signed)
[facility]  PATIENT: Christian Parsons DOB: 1945-11-04  REFERRING CLINICIAN: Chucky May, MD HISTORY FROM: Patient  REASON FOR VISIT: Confusion   HISTORICAL  CHIEF COMPLAINT:  Chief Complaint  Patient presents with   New Patient (Initial Visit)    New Patient:  Had bout of confusion at end of 7 mile hike on hot day, paper referral from PCP Dr. Toy [facility] Room 4, alone in room    HISTORY OF PRESENT ILLNESS:  This is a 75 year old man with PMHx of mood disorder and migraines headaches who is presenting for an episode of confusion. Patient states at a end of 7 miles hike, during a hot muggy day, he misidentified a group of people who approached him, confused their names, states that he was back to his normal self on the way home in the car. This happened about 5 to 6 weeks ago and since then, the has been back to his normal self. He continues to hike about 4 to 5 times per week, denies any additional episode of acute confusional state. He states that yesterday was a bad day because he forget to take his Lamotrigine, states that he was low and unhappy, feels good today after resuming his medications.   He reports prior history of heat exhaustion during his previous hike, the first one was very similar as he was having confusion, and the second time he was tired and he could not go anymore and went back down to the starting line.    He reports mild memory problem, described as misplacing things, his keys, wallet. Reports that he lives with wife, able to cook, clean, take care of the finances, still drive, uses google maps.     OTHER MEDICAL CONDITIONS: Urinary retention, Migraines Headaches,    REVIEW OF SYSTEMS: Full 14 system review of systems performed and negative with exception of: as noted in the HPI  ALLERGIES: No Known Allergies  HOME MEDICATIONS: Outpatient Medications Prior to Visit  Medication Sig Dispense Refill   aspirin EC 81 MG tablet Take 81 mg by  mouth daily.     lamoTRIgine (LAMICTAL) 200 MG tablet Take 1 tablet (200 mg total) by mouth daily. 90 tablet 3   LORazepam (ATIVAN) 0.5 MG tablet Take 1 tablet (0.5 mg total) by mouth as needed. 30 tablet 0   rizatriptan (MAXALT) 10 MG tablet TAKE 1 TABLET BY MOUTH AS NEEDED FOR MIGRAINE. MAY REPEAT IN 2 HOURS IF NEEDED 10 tablet 4   methocarbamol (ROBAXIN) 500 MG tablet Take 1 tablet (500 mg total) by mouth every 8 (eight) hours as needed for muscle spasms. 60 tablet 0   naratriptan (AMERGE) 2.5 MG tablet Take 1 tablet (2.5 mg total) by mouth as needed for migraine. Take one (1) tablet at onset of headache; if returns or does not resolve, may repeat after 4 hours; do not exceed five (5) mg in 24 hours. 10 tablet 0   No facility-administered medications prior to visit.    PAST MEDICAL HISTORY: Past Medical History:  Diagnosis Date   Anxiety    BPH with urinary obstruction    s/p TURP   Chronic kidney disease    Chronic renal insufficiency, stage 2 (mild)    secondary to lithium toxicity; baseline creatinine 1.3-1.6   Coronary artery disease    Depression    Hypertension     PAST SURGICAL HISTORY: Past Surgical History:  Procedure Laterality Date   HERNIA REPAIR     PROSTATE SURGERY  TRANSURETHRAL RESECTION OF PROSTATE      FAMILY HISTORY: Family History  Problem Relation Age of Onset   Cancer Mother        breast cancer   Dementia Father    Arthritis Sister        hip OA s/p hip replacement    SOCIAL HISTORY: Social History   Socioeconomic History   Marital status: Married    Spouse name: Horris Latino   Number of children: 1   Years of education: 16+   Highest education level: Professional school degree (e.g., MD, DDS, DVM, JD)  Occupational History   Occupation: retired    Comment: Music therapist  Tobacco Use   Smoking status: Never   Smokeless tobacco: Never  Substance and Sexual Activity   Alcohol use: Yes    Alcohol/week: 2.0 standard drinks    Types: 2  Standard drinks or equivalent per week    Comment: Occasional   Drug use: No   Sexual activity: Yes    Partners: Female  Other Topics Concern   Not on file  Social History Narrative   Lives with Wife   Right Handed   Drinks 2-3 cups caffeine daily   Social Determinants of Health   Financial Resource Strain: Low Risk    Difficulty of Paying Living Expenses: Not hard at all  Food Insecurity: No Food Insecurity   Worried About Charity fundraiser in the Last Year: Never true   Arboriculturist in the Last Year: Never true  Transportation Needs: No Transportation Needs   Lack of Transportation (Medical): No   Lack of Transportation (Non-Medical): No  Physical Activity: Sufficiently Active   Days of Exercise per Week: 5 days   Minutes of Exercise per Session: 60 min  Stress: No Stress Concern Present   Feeling of Stress : Not at all  Social Connections: Socially Integrated   Frequency of Communication with Friends and Family: More than three times a week   Frequency of Social Gatherings with Friends and Family: More than three times a week   Attends Religious Services: More than 4 times per year   Active Member of [employer] or Organizations: Yes   Attends Music therapist: More than 4 times per year   Marital Status: Married  Human resources officer Violence: Not on file     PHYSICAL EXAM  GENERAL EXAM/CONSTITUTIONAL: Vitals:  Vitals:   05/08/21 1346  BP: (!) 143/82  Pulse: 93  Weight: 219 lb 8 oz (99.6 kg)  Height: 6' (1.829 m)   Body mass index is 29.77 kg/m. Wt Readings from Last 3 Encounters:  05/08/21 219 lb 8 oz (99.6 kg)  12/12/20 214 lb 6.4 oz (97.3 kg)  10/26/20 210 lb (95.3 kg)   Patient is in no distress; well developed, nourished and groomed; neck is supple  CARDIOVASCULAR: Examination of carotid arteries is normal; no carotid bruits Regular rate and rhythm, no murmurs Examination of peripheral vascular system by observation and palpation is  normal  EYES: Pupils round and reactive to light, Visual fields full to confrontation, Extraocular movements intacts,   MUSCULOSKELETAL: Gait, strength, tone, movements noted in Neurologic exam below  NEUROLOGIC: MENTAL STATUS:  awake, alert, oriented to person, place and time recent and remote memory intact normal attention and concentration language fluent, comprehension intact, naming intact fund of knowledge appropriate  CRANIAL NERVE:  2nd, 3rd, 4th, 6th - pupils equal and reactive to light, visual fields full to confrontation, extraocular muscles intact, no  nystagmus 5th - facial sensation symmetric 7th - facial strength symmetric 8th - hearing intact 9th - palate elevates symmetrically, uvula midline 11th - shoulder shrug symmetric 12th - tongue protrusion midline  MOTOR:  normal bulk and tone, full strength in the BUE, BLE. There is mild action tremors, not interfering with patient   SENSORY:  normal and symmetric to light touch, pinprick, temperature, vibration  COORDINATION:  finger-nose-finger, fine finger movements normal  REFLEXES:  deep tendon reflexes present and symmetric  GAIT/STATION:  normal     DIAGNOSTIC DATA (LABS, IMAGING, TESTING) - I reviewed patient records, labs, notes, testing and imaging myself where available.  Lab Results  Component Value Date   WBC 4.5 12/12/2020   HGB 15.3 12/12/2020   HCT 44.1 12/12/2020   MCV 94.3 12/12/2020   PLT 141.0 (L) 12/12/2020      Component Value Date/Time   NA 139 12/12/2020 0849   NA 141 09/28/2019 1435   K 5.2 (H) 12/12/2020 0849   CL 104 12/12/2020 0849   CO2 30 12/12/2020 0849   GLUCOSE 104 (H) 12/12/2020 0849   BUN 29 (H) 12/12/2020 0849   BUN 23 09/28/2019 1435   CREATININE 1.55 (H) 12/12/2020 0849   CREATININE 1.77 (H) 07/25/2016 0806   CALCIUM 9.8 12/12/2020 0849   PROT 7.3 12/12/2020 0849   PROT 6.8 09/28/2019 1435   ALBUMIN 4.4 12/12/2020 0849   ALBUMIN 4.1 09/28/2019 1435    AST 19 12/12/2020 0849   ALT 21 12/12/2020 0849   ALKPHOS 73 12/12/2020 0849   BILITOT 0.6 12/12/2020 0849   BILITOT 0.6 09/28/2019 1435   GFRNONAA 45 (L) 09/28/2019 1435   GFRAA 52 (L) 09/28/2019 1435   Lab Results  Component Value Date   CHOL 134 12/12/2020   HDL 37.50 (L) 12/12/2020   LDLCALC 70 12/12/2020   TRIG 133.0 12/12/2020   CHOLHDL 4 12/12/2020   Lab Results  Component Value Date   HGBA1C 5.6 12/12/2020   No results found for: VITAMINB12 Lab Results  Component Value Date   TSH 1.006 08/25/2015     ASSESSMENT AND PLAN  75 y.o. year old male with past medical history of mood disorder, migraine headaches who is presenting after a acute confusional state following a 7 mile hike.  Patient states at the end of the hike, he was greeted by a group of person and he confused their names.  He reported that he was back to his normal self on his way home and since then has not had any additional report of acute confusional state.  He continues to hike about 4-5 times per week.  Stated that he has been back to his normal self since then no other complaint.  He reported previous history of heat exhaustion where he noted at one point he was confused, and in the second episode he mentioned that he was so tired and could not go on any longer.  Patient likely had a acute confusional state due to likely heat exhaustion, dehydration and tiredness following a 7 mile hike. His neurological exam is nonfocal.  At present no additional neurological evaluation needed.  I recommended the patient to remain well-hydrated during his hikes.   On exam there is mild action tremor, no signs of parkinsonism I recommended patient that we can continue to monitor it. Return to clinic as needed or if symptoms worsen.   1. Confusion state   2. Tremor     PLAN: Continue your current medications  No orders of the defined types were placed in this encounter.   No orders of the defined types were placed in  this encounter.   Return in about 1 year (around 05/08/2022).    Alric Ran, MD 05/08/2021, 3:15 PM  [facility] [address], [place], [place] [address] [phone]

## 2021-05-08 NOTE — Patient Instructions (Signed)
Continue your current medications.

## 2021-05-08 NOTE — Telephone Encounter (Signed)
Patient would like to speak to you about Epic - He did not give permission to the Urologist  Dr. April Manson - [facility] - - but details were placed in Epic and he would like them removed.

## 2021-05-17 ENCOUNTER — Ambulatory Visit: Payer: Medicare Other | Admitting: Registered Nurse

## 2021-05-18 ENCOUNTER — Other Ambulatory Visit: Payer: Self-pay | Admitting: Registered Nurse

## 2021-05-18 DIAGNOSIS — G43109 Migraine with aura, not intractable, without status migrainosus: Secondary | ICD-10-CM

## 2021-06-13 ENCOUNTER — Telehealth (HOSPITAL_COMMUNITY): Payer: Self-pay | Admitting: Cardiology

## 2021-06-13 NOTE — Telephone Encounter (Signed)
I called patient to schedule echocardiogram that Dr. Marlou Porch ordered for November.  Patient stated that he will not be scheduling echo due to he will not be following up with Dr.Skains anymore. Order will be removed from the echo wq.

## 2021-06-13 NOTE — Telephone Encounter (Signed)
Will route this message to Dr. Marlou Porch and covering RN, as a general FYI.

## 2021-06-14 ENCOUNTER — Encounter: Payer: Self-pay | Admitting: Registered Nurse

## 2021-06-14 ENCOUNTER — Other Ambulatory Visit: Payer: Self-pay | Admitting: Registered Nurse

## 2021-06-14 DIAGNOSIS — I719 Aortic aneurysm of unspecified site, without rupture: Secondary | ICD-10-CM | POA: Insufficient documentation

## 2021-06-14 DIAGNOSIS — I714 Abdominal aortic aneurysm, without rupture, unspecified: Secondary | ICD-10-CM

## 2021-06-14 NOTE — Telephone Encounter (Signed)
This is something that goes beyond my scope - I would refer him to the patient facing IT number for assistance here.   If you wanted to let him know that, and let him know I have ordered an MRI angiogram of his chest to monitor his aortic dilation, that would be great  Thanks  Rich

## 2021-06-14 NOTE — Telephone Encounter (Signed)
MRA ordered. Will contact patient to let him know importance of this order and follow up.  Thanks,  Denice Paradise

## 2021-06-14 NOTE — Telephone Encounter (Signed)
Thanks for letting me know. [name], please make sure he is being followed for this ascending aortic aneurysm.  Candee Furbish, MD

## 2021-06-15 NOTE — Telephone Encounter (Signed)
Called and spoke with patient and per you informed him the reason for the MRI. Patient was not understanding and stated that he did not remember having a conversation about having one done. Patient wanted to talk to you right away. I informed patient that you were seeing patients and could not discuss his concerns at the moment but happy to schedule him a virtual visit to discuss them. Patient agreed and he is scheduled on Monday 06/18/21 at 1:50pm.

## 2021-06-15 NOTE — Telephone Encounter (Signed)
Please call pt today to make him aware as to why he is having this MRI done and have him call [phone] to get it scheduled.

## 2021-06-18 ENCOUNTER — Encounter: Payer: Self-pay | Admitting: Registered Nurse

## 2021-06-18 ENCOUNTER — Other Ambulatory Visit: Payer: Self-pay

## 2021-06-18 ENCOUNTER — Telehealth (INDEPENDENT_AMBULATORY_CARE_PROVIDER_SITE_OTHER): Payer: Medicare Other | Admitting: Registered Nurse

## 2021-06-18 DIAGNOSIS — I714 Abdominal aortic aneurysm, without rupture, unspecified: Secondary | ICD-10-CM

## 2021-06-18 DIAGNOSIS — I700 Atherosclerosis of aorta: Secondary | ICD-10-CM

## 2021-06-18 NOTE — Patient Instructions (Signed)
° ° ° °  If you have lab work done today you will be contacted with your lab results within the next 2 weeks.  If you have not heard from us then please contact us. The fastest way to get your results is to register for My Chart. ° ° °IF you received an x-ray today, you will receive an invoice from [facility]. Please contact [facility] at [phone] with questions or concerns regarding your invoice.  ° °IF you received labwork today, you will receive an invoice from [facility]. Please contact [facility] at [phone] with questions or concerns regarding your invoice.  ° °Our billing staff will not be able to assist you with questions regarding bills from these companies. ° °You will be contacted with the lab results as soon as they are available. The fastest way to get your results is to activate your My Chart account. Instructions are located on the last page of this paperwork. If you have not heard from us regarding the results in 2 weeks, please contact this office. °  ° ° ° °

## 2021-06-18 NOTE — Progress Notes (Signed)
Telemedicine Encounter- SOAP NOTE Established Patient  This telephone encounter was conducted with the patient's (or proxy's) verbal consent via audio telecommunications: yes/no: Yes Patient was instructed to have this encounter in a suitably private space; and to only have persons present to whom they give permission to participate. In addition, patient identity was confirmed by use of name plus two identifiers (DOB and address).  I discussed the limitations, risks, security and privacy concerns of performing an evaluation and management service by telephone and the availability of in person appointments. I also discussed with the patient that there may be a patient responsible charge related to this service. The patient expressed understanding and agreed to proceed.  I spent a total of 32 minutes talking with the patient or their proxy.  Patient at home Provider in office  Participants: Kathrin Ruddy, NP and [name]  Chief Complaint  Patient presents with   Follow-up    Patient states he would like to discuss CT scan and other concerns.    Subjective   Christian Parsons is a 75 y.o. established patient. Telephone visit today for follow up   HPI Had been followed by Dr. Marlou Porch for cardiology - noted to have 67mm aortic aneurysm. Unfortunately he had a negative experience in this office and has withdrawn from his care.  Has concerns regarding follow up for AAA, atherosclerosis Unsure why MRI vs. CT vs. [place].  Concerned about representation of accuracy in his chart. Acknowledges history of hyperlipidemia but much better numbers for past number of years -since starting hiking. HDL always on low side, but has improved dramatically with walking and hiking.  BP borderline in some healthcare offices, but has been good in ours  No ongoing CV symptoms or concerns.   Patient Active Problem List   Diagnosis Date Noted   Aortic aneurysm without rupture ([place]) 06/14/2021   Abdominal  aortic aneurysm (AAA) without rupture 06/14/2021   Atherosclerosis of aorta ([place]) 11/18/2017   Left sided sciatica 11/18/2017   Pes planus 03/13/2017   Knee pain 03/11/2017   BPH with urinary obstruction 08/12/2016   CKD (chronic kidney disease) stage 2 or early 3 07/11/2015   Ventral hernia without obstruction or gangrene 02/17/2015   Diastasis recti 12/22/2014   Erectile dysfunction 12/22/2014   Benign essential tremor 06/16/2013   Migraine headache with aura 12/25/2011   BMI 31.0-31.9,adult 12/25/2011   Right bundle branch block 12/25/2011   Mood disorder ([place])     Past Medical History:  Diagnosis Date   Anxiety    BPH with urinary obstruction    s/p TURP   Chronic kidney disease    Chronic renal insufficiency, stage 2 (mild)    secondary to lithium toxicity; baseline creatinine 1.3-1.6   Coronary artery disease    Depression    Hypertension     Current Outpatient Medications  Medication Sig Dispense Refill   aspirin EC 81 MG tablet Take 81 mg by mouth daily.     lamoTRIgine (LAMICTAL) 200 MG tablet Take 1 tablet (200 mg total) by mouth daily. 90 tablet 3   LORazepam (ATIVAN) 0.5 MG tablet Take 1 tablet (0.5 mg total) by mouth as needed. 30 tablet 0   rizatriptan (MAXALT) 10 MG tablet TAKE 1 TABLET BY MOUTH AS NEEDED FOR MIGRAINE. MAY REPEAT IN 2 HOURS IF NEEDED 10 tablet 2   No current facility-administered medications for this visit.    Allergies  Allergen Reactions   Levaquin [Levofloxacin] Other (See Comments)  Avoid fluoroquinolones if possible due to AAA.    Social History   Socioeconomic History   Marital status: Married    Spouse name: Horris Latino   Number of children: 1   Years of education: 16+   Highest education level: Professional school degree (e.g., MD, DDS, DVM, JD)  Occupational History   Occupation: retired    Comment: Music therapist  Tobacco Use   Smoking status: Never   Smokeless tobacco: Never  Substance and Sexual Activity    Alcohol use: Yes    Alcohol/week: 2.0 standard drinks    Types: 2 Standard drinks or equivalent per week    Comment: Occasional   Drug use: No   Sexual activity: Yes    Partners: Female  Other Topics Concern   Not on file  Social History Narrative   Lives with Wife   Right Handed   Drinks 2-3 cups caffeine daily   Social Determinants of Health   Financial Resource Strain: Low Risk    Difficulty of Paying Living Expenses: Not hard at all  Food Insecurity: No Food Insecurity   Worried About Charity fundraiser in the Last Year: Never true   Arboriculturist in the Last Year: Never true  Transportation Needs: No Transportation Needs   Lack of Transportation (Medical): No   Lack of Transportation (Non-Medical): No  Physical Activity: Sufficiently Active   Days of Exercise per Week: 5 days   Minutes of Exercise per Session: 60 min  Stress: No Stress Concern Present   Feeling of Stress : Not at all  Social Connections: Socially Integrated   Frequency of Communication with Friends and Family: More than three times a week   Frequency of Social Gatherings with Friends and Family: More than three times a week   Attends Religious Services: More than 4 times per year   Active Member of [employer] or Organizations: Yes   Attends Music therapist: More than 4 times per year   Marital Status: Married  Human resources officer Violence: Not on file    ROS Per hpi   Objective   Vitals as reported by the patient: There were no vitals filed for this visit.  Tadao was seen today for follow-up.  Diagnoses and all orders for this visit:  Abdominal aortic aneurysm (AAA) without rupture, unspecified part -     Ambulatory referral to Cardiology  Atherosclerosis of aorta [facility]) -     Ambulatory referral to Cardiology   PLAN Refer to cardiology - Dr. Einar Gip Follow up as scheduled Patient encouraged to call clinic with any questions, comments, or concerns.  I discussed the assessment  and treatment plan with the patient. The patient was provided an opportunity to ask questions and all were answered. The patient agreed with the plan and demonstrated an understanding of the instructions.   The patient was advised to call back or seek an in-person evaluation if the symptoms worsen or if the condition fails to improve as anticipated.  I provided 32 minutes of non-face-to-face time during this encounter.  Maximiano Coss, NP

## 2021-07-03 NOTE — Telephone Encounter (Signed)
Addressed in visit  Thank you  Rich

## 2021-07-16 ENCOUNTER — Other Ambulatory Visit: Payer: Self-pay

## 2021-07-16 ENCOUNTER — Encounter: Payer: Self-pay | Admitting: Cardiology

## 2021-07-16 ENCOUNTER — Ambulatory Visit: Payer: Medicare Other | Admitting: Cardiology

## 2021-07-16 VITALS — BP 143/92 | HR 68 | Temp 97.9°F | Resp 16 | Ht 72.0 in | Wt 217.8 lb

## 2021-07-16 DIAGNOSIS — I700 Atherosclerosis of aorta: Secondary | ICD-10-CM

## 2021-07-16 DIAGNOSIS — I7121 Aneurysm of the ascending aorta, without rupture: Secondary | ICD-10-CM

## 2021-07-16 DIAGNOSIS — R030 Elevated blood-pressure reading, without diagnosis of hypertension: Secondary | ICD-10-CM

## 2021-07-16 DIAGNOSIS — N1831 Chronic kidney disease, stage 3a: Secondary | ICD-10-CM

## 2021-07-16 MED ORDER — LOSARTAN POTASSIUM 25 MG PO TABS: 25.0000 mg | ORAL_TABLET | Freq: Every day | ORAL | 2 refills | Status: DC

## 2021-07-16 NOTE — Progress Notes (Signed)
Primary Physician/Referring:  Maximiano Coss, NP  Patient ID: Christian Parsons, male    DOB: [birth year], 75 y.o.   MRN: [id number]  Chief Complaint  Patient presents with   AAA    Without rupture   Atherosclerosis of aorta   New Patient (Initial Visit)    Referred by Nedra Hai, NP   HPI:    Christian Parsons  is a 75 y.o. 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  He has no history of hypertension, hyperlipidemia or tobacco use disorder or family history of aortic aneurysm.  Patient was previously seen by Dr. Candee Furbish.  Patient is skeptical about the presence of ascending aortic aneurysm and does not want any imaging as he is concerned about excessive radiation.  States that he is asymptomatic.  Past Medical History:  Diagnosis Date   Anxiety    BPH with urinary obstruction    s/p TURP   Chronic kidney disease    Depression    Stage 3a chronic kidney disease (CKD) (HCC)    secondary to lithium toxicity; baseline creatinine 1.3-1.6   Past Surgical History:  Procedure Laterality Date   HERNIA REPAIR     PROSTATE SURGERY     TRANSURETHRAL RESECTION OF PROSTATE     Family History  Problem Relation Age of Onset   Cancer Mother        breast cancer   Dementia Father    Arthritis Sister        hip OA s/p hip replacement    Social History   Tobacco Use   Smoking status: Never   Smokeless tobacco: Never  Substance Use Topics   Alcohol use: Not Currently    Alcohol/week: 2.0 standard drinks    Types: 2 Standard drinks or equivalent per week    Comment: Stop drinking 6-7 months ago   Marital Status: Married  ROS  Review of Systems  Cardiovascular:  Negative for chest pain, dyspnea on exertion and leg swelling.   Gastrointestinal:  Negative for melena.  Objective  Blood pressure (!) 143/92, pulse 68, temperature 97.9 F (36.6 C), temperature source Temporal, resp. rate 16, height 6' (1.829 m), weight 217 lb 12.8 oz (98.8 kg), SpO2 100 %. Body mass index is 29.54 kg/m.  Vitals with BMI 07/16/2021 07/16/2021 05/08/2021  Height - 6\' 0"  6\' 0"   Weight - 217 lbs 13 oz 219 lbs 8 oz  BMI - 94.70 96.28  Systolic 366 294 765  Diastolic 92 92 82  Pulse 68 61 93     Physical Exam Neck:     Vascular: No carotid bruit or JVD.  Cardiovascular:     Rate and Rhythm: Normal rate and regular rhythm.     Pulses: Intact distal pulses.     Heart sounds: Normal heart sounds. No murmur heard.   No gallop.  Pulmonary:     Effort: Pulmonary effort is normal.     Breath sounds: Normal breath sounds.  Abdominal:     General: Bowel sounds are normal.     Palpations: Abdomen is soft.  Musculoskeletal:        General: No swelling.     Laboratory examination:   Recent Labs    12/12/20 0849  NA  139  K 5.2*  CL 104  CO2 30  GLUCOSE 104*  BUN 29*  CREATININE 1.55*  CALCIUM 9.8   CrCl cannot be calculated (Patient's most recent lab result is older than the maximum 21 days allowed.).  CMP Latest Ref Rng & Units 12/12/2020 09/28/2019 06/04/2019  Glucose 70 - 99 mg/dL 104(H) 92 95  BUN 6 - 23 mg/dL 29(H) 23 24  Creatinine 0.40 - 1.50 mg/dL 1.55(H) 1.53(H) 1.46(H)  Sodium 135 - 145 mEq/L 139 141 144  Potassium 3.5 - 5.1 mEq/L 5.2(H) 4.5 4.5  Chloride 96 - 112 mEq/L 104 104 106  CO2 19 - 32 mEq/L 30 24 22   Calcium 8.4 - 10.5 mg/dL 9.8 9.5 9.6  Total Protein 6.0 - 8.3 g/dL 7.3 6.8 7.0  Total Bilirubin 0.2 - 1.2 mg/dL 0.6 0.6 0.4  Alkaline Phos 39 - 117 U/L 73 85 84  AST 0 - 37 U/L 19 20 24   ALT 0 - 53 U/L 21 25 21    CBC Latest Ref Rng & Units 12/12/2020 03/27/2020 06/04/2019  WBC 4.0 - 10.5 K/uL 4.5 5.1 5.4  Hemoglobin 13.0 - 17.0 g/dL 15.3 14.1 15.3  Hematocrit 39.0 - 52.0 % 44.1 40.6 44.4  Platelets 150.0  - 400.0 K/uL 141.0(L) - 153    Lipid Panel Recent Labs    12/12/20 0849  CHOL 134  TRIG 133.0  LDLCALC 70  VLDL 26.6  HDL 37.50*  CHOLHDL 4   Lipid Panel     Component Value Date/Time   CHOL 134 12/12/2020 0849   CHOL 124 03/27/2020 1428   TRIG 133.0 12/12/2020 0849   HDL 37.50 (L) 12/12/2020 0849   HDL 38 (L) 03/27/2020 1428   CHOLHDL 4 12/12/2020 0849   VLDL 26.6 12/12/2020 0849   LDLCALC 70 12/12/2020 0849   LDLCALC 69 03/27/2020 1428   LABVLDL 17 03/27/2020 1428     HEMOGLOBIN A1C Lab Results  Component Value Date   HGBA1C 5.6 12/12/2020   TSH No results for input(s): TSH in the last 8760 hours.  Medications and allergies   Allergies  Allergen Reactions   Levaquin [Levofloxacin] Other (See Comments)    Avoid fluoroquinolones if possible due to AAA.     Medication prior to this encounter:   Outpatient Medications Prior to Visit  Medication Sig Dispense Refill   aspirin EC 81 MG tablet Take 81 mg by mouth daily.     lamoTRIgine (LAMICTAL) 200 MG tablet Take 1 tablet (200 mg total) by mouth daily. 90 tablet 3   LORazepam (ATIVAN) 0.5 MG tablet Take 1 tablet (0.5 mg total) by mouth as needed. 30 tablet 0   rizatriptan (MAXALT) 10 MG tablet TAKE 1 TABLET BY MOUTH AS NEEDED FOR MIGRAINE. MAY REPEAT IN 2 HOURS IF NEEDED 10 tablet 2   No facility-administered medications prior to visit.     Medication list after today's encounter   Current Outpatient Medications  Medication Instructions   aspirin EC 81 mg, Oral, Daily   lamoTRIgine (LAMICTAL) 200 mg, Oral, Daily   LORazepam (ATIVAN) 0.5 mg, Oral, As needed   losartan (COZAAR) 25 mg, Oral, Daily   rizatriptan (MAXALT) 10 MG tablet TAKE 1 TABLET BY MOUTH AS NEEDED FOR MIGRAINE. MAY REPEAT IN 2 HOURS IF NEEDED    Radiology:   CT of the abdomen 10/20/2018: No evidence of AAA. Vascular/Lymphatic: Atherosclerotic calcifications of aorta and iliac arteries. Aorta normal caliber. No adenopathy.  MR angio  of the chest 07/30/2019: 1. Mild fusiform  aneurysmal dilatation of the ascending thoracic aorta measuring 44 mm in maximal diameter. Recommend annual imaging followup by CTA or MRA. 2. Borderline cardiomegaly. 3. Previously characterized benign hepatic hemangioma is morphologically unchanged compared to remote abdominal CT 04/2014.  Cardiac Studies:   Echocardiogram 05/10/2019:  1. The left ventricle has normal systolic function with an ejection fraction of 60-65%. The cavity size was normal. Left ventricular diastolic Doppler parameters are consistent with impaired relaxation. Indeterminate filling pressures The E/e' is 8-15.  No evidence of left ventricular regional wall motion abnormalities.  2. The right ventricle has normal systolic function. The cavity was normal. There is no increase in right ventricular wall thickness.  3. The mitral valve is abnormal. Mild thickening of the mitral valve leaflet.  4.  The aortic valve is tricuspid. Mild sclerosis of the aortic valve. Aortic valve regurgitation trivial to mild. No stenosis of the aortic valve.  6. The aorta is abnormal unless otherwise noted.  There is moderate dilatation of the ascending aorta measuring 45 mm.  Exercise nuclear stress test 06/23/2019: Patient exercised on Bruce protocol for 9 minutes and achieved 10.1 METS. Normal nuclear perfusion, LVEF 57%, low risk.  EKG:   EKG 10/31/202 2: Sinus rhythm with first-degree AV block at rate of 60 bpm, left atrial enlargement, right bundle branch block.  Compared to 08/15/2020, first-degree AV block is new.  Assessment     ICD-10-CM   1. Aneurysm of ascending aorta without rupture  I71.21 PCV ECHOCARDIOGRAM COMPLETE    2. Aortic atherosclerosis (HCC)  I70.0 EKG 12-Lead    3. Stage 3a chronic kidney disease (HCC)  N18.31 losartan (COZAAR) 25 MG tablet    Basic metabolic panel    Basic metabolic panel    4. Elevated blood pressure reading in office without diagnosis of  hypertension  R03.0        There are no discontinued medications.  Meds ordered this encounter  Medications   losartan (COZAAR) 25 MG tablet    Sig: Take 1 tablet (25 mg total) by mouth daily.    Dispense:  30 tablet    Refill:  2    Orders Placed This Encounter  Procedures   Basic metabolic panel    Standing Status:   Future    Number of Occurrences:   1    Standing Expiration Date:   07/16/2022   EKG 12-Lead   PCV ECHOCARDIOGRAM COMPLETE    Standing Status:   Future    Standing Expiration Date:   07/16/2022    Order Specific Question:   Comment    Answer:   4.5 cm Asc aortic aneurysm   Recommendations:   JESSON FOSKEY is a 75 y.o.  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  He has no history of hypertension, hyperlipidemia or tobacco use disorder or family history of aortic aneurysm.  I have reviewed all the available data for the patient, advised him that he indeed certainly has ascending aortic aneurysm.  He is stable presently, remains very active, indications for surgery were discussed.  He prefers not to have any radiologic survey, I will set him up for an echocardiogram as there has been excellent correlation between echocardiogram and MRI in the past.  I also  discussed with him regarding presence of chronic stage IIIb/IIIa kidney disease, he will benefit from being on an ARB especially losartan but in view of asymptomatic calcium and underlying chronic kidney disease.  I also discussed with him that possibility of falsely elevated serum creatinine with the use of losartan but long-term benefits of being on losartan and also offered him nephrology consultation.  I will start him at 25 mg daily, will obtain a BMP in 2  to 3 weeks.  With regard to aortic atherosclerosis, he probably has age-appropriate aortic atherosclerosis.  Lipids are well controlled and normal except for mildly reduced HDL.  We could certainly consider low-dose statin.    I would like to see him back in 6 weeks for follow-up.  His blood pressure was elevated in office today but he does not have a history of hypertension and states that it is whitecoat hypertension being in cardiology office.  This was a >60-minute office visit encounter in evaluation of external records, discussion with the patient regarding complex medical issues.    Adrian Prows, MD, [facility] 07/16/2021, 12:22 PM Office: [phone]

## 2021-07-17 LAB — BASIC METABOLIC PANEL
BUN/Creatinine Ratio: 14 (ref 10–24)
BUN: 23 mg/dL (ref 8–27)
CO2: 25 mmol/L (ref 20–29)
Calcium: 9.7 mg/dL (ref 8.6–10.2)
Chloride: 103 mmol/L (ref 96–106)
Creatinine, Ser: 1.63 mg/dL — ABNORMAL HIGH (ref 0.76–1.27)
Glucose: 99 mg/dL (ref 70–99)
Potassium: 4.5 mmol/L (ref 3.5–5.2)
Sodium: 143 mmol/L (ref 134–144)
eGFR: 44 mL/min/{1.73_m2} — ABNORMAL LOW (ref >59)

## 2021-07-17 NOTE — Addendum Note (Signed)
Addended by: Kela Millin on: 07/17/2021 12:07 PM   Modules accepted: Orders

## 2021-07-19 ENCOUNTER — Ambulatory Visit: Payer: Medicare Other

## 2021-07-19 ENCOUNTER — Other Ambulatory Visit: Payer: Self-pay

## 2021-07-19 DIAGNOSIS — I7121 Aneurysm of the ascending aorta, without rupture: Secondary | ICD-10-CM

## 2021-08-17 ENCOUNTER — Other Ambulatory Visit: Payer: Self-pay | Admitting: Cardiology

## 2021-08-17 DIAGNOSIS — N1831 Chronic kidney disease, stage 3a: Secondary | ICD-10-CM

## 2021-08-20 ENCOUNTER — Telehealth: Payer: Medicare Other | Admitting: Physician Assistant

## 2021-08-20 DIAGNOSIS — R310 Gross hematuria: Secondary | ICD-10-CM

## 2021-08-20 NOTE — Patient Instructions (Signed)
Christian Parsons, thank you for joining Mar Daring, PA-C for today's virtual visit.  While this provider is not your primary care provider (PCP), if your PCP is located in our provider database this encounter information will be shared with them immediately following your visit.  Consent: (Patient) Christian Parsons provided verbal consent for this virtual visit at the beginning of the encounter.  Current Medications:  Current Outpatient Medications:    aspirin EC 81 MG tablet, Take 81 mg by mouth daily., Disp: , Rfl:    lamoTRIgine (LAMICTAL) 200 MG tablet, Take 1 tablet (200 mg total) by mouth daily., Disp: 90 tablet, Rfl: 3   LORazepam (ATIVAN) 0.5 MG tablet, Take 1 tablet (0.5 mg total) by mouth as needed., Disp: 30 tablet, Rfl: 0   losartan (COZAAR) 25 MG tablet, TAKE 1 TABLET (25 MG TOTAL) BY MOUTH DAILY., Disp: 90 tablet, Rfl: 0   rizatriptan (MAXALT) 10 MG tablet, TAKE 1 TABLET BY MOUTH AS NEEDED FOR MIGRAINE. MAY REPEAT IN 2 HOURS IF NEEDED, Disp: 10 tablet, Rfl: 2   Medications ordered in this encounter:  No orders of the defined types were placed in this encounter.    *If you need refills on other medications prior to your next appointment, please contact your pharmacy*  Follow-Up: Call back or seek an in-person evaluation if the symptoms worsen or if the condition fails to improve as anticipated.  Other Instructions Hematuria, Adult Hematuria is blood in the urine. Blood may be visible in the urine, or it may be identified with a test. This condition can be caused by infections of the bladder, urethra, kidney, or prostate. Other possible causes include: Kidney stones. Cancer of the urinary tract. Too much calcium in the urine. Conditions that are passed from parent to child (inherited conditions). Exercise that requires a lot of energy. Infections can usually be treated with medicine, and a kidney stone usually will pass through your urine. If neither of these is the  cause of your hematuria, more tests may be needed to identify the cause of your symptoms. It is very important to tell your health care provider about any blood in your urine, even if it is painless or the blood stops without treatment. Blood in the urine, when it happens and then stops and then happens again, can be a symptom of a very serious condition, including cancer. There is no pain in the initial stages of many urinary cancers. Follow these instructions at home: Medicines Take over-the-counter and prescription medicines only as told by your health care provider. If you were prescribed an antibiotic medicine, take it as told by your health care provider. Do not stop taking the antibiotic even if you start to feel better. Eating and drinking Drink enough fluid to keep your urine pale yellow. It is recommended that you drink 3-4 quarts (2.8-3.8 L) a day. If you have been diagnosed with an infection, drinking cranberry juice in addition to large amounts of water is recommended. Avoid caffeine, tea, and carbonated beverages. These tend to irritate the bladder. Avoid alcohol because it may irritate the prostate (in males). General instructions If you have been diagnosed with a kidney stone, follow your health care provider's instructions about straining your urine to catch the stone. Empty your bladder often. Avoid holding urine for long periods of time. If you are male: After a bowel movement, wipe from front to back and use each piece of toilet paper only once. Empty your bladder before and after  sex. Pay attention to any changes in your symptoms. Tell your health care provider about any changes or any new symptoms. It is up to you to get the results of any tests. Ask your health care provider, or the department that is doing the test, when your results will be ready. Keep all follow-up visits. This is important. Contact a health care provider if: You develop back pain. You have a fever or  chills. You have nausea or vomiting. Your symptoms do not improve after 3 days. Your symptoms get worse. Get help right away if: You develop severe vomiting and are unable to take medicine without vomiting. You develop severe pain in your back or abdomen even though you are taking medicine. You pass a large amount of blood in your urine. You pass blood clots in your urine. You feel very weak or like you might faint. You faint. Summary Hematuria is blood in the urine. It has many possible causes. It is very important that you tell your health care provider about any blood in your urine, even if it is painless or the blood stops without treatment. Take over-the-counter and prescription medicines only as told by your health care provider. Drink enough fluid to keep your urine pale yellow. This information is not intended to replace advice given to you by your health care provider. Make sure you discuss any questions you have with your health care provider. Document Revised: 05/03/2020 Document Reviewed: 05/03/2020 Elsevier Patient Education  2022 [employer].    If you have been instructed to have an in-person evaluation today at a local Urgent Care facility, please use the link below. It will take you to a list of all of our available [facility], including address, phone number and hours of operation. Please do not delay care.  [facility]  If you or a family member do not have a primary care provider, use the link below to schedule a visit and establish care. When you choose a [facility] primary care physician or advanced practice provider, you gain a long-term partner in health. Find a Primary Care Provider  Learn more about [facility]'s in-office and virtual care options: [facility]

## 2021-08-20 NOTE — Progress Notes (Signed)
Virtual Visit Consent   [name], you are scheduled for a virtual visit with a [facility] provider today.     Just as with appointments in the office, your consent must be obtained to participate.  Your consent will be active for this visit and any virtual visit you may have with one of our providers in the next 365 days.     If you have a MyChart account, a copy of this consent can be sent to you electronically.  All virtual visits are billed to your insurance company just like a traditional visit in the office.    As this is a virtual visit, video technology does not allow for your provider to perform a traditional examination.  This may limit your provider's ability to fully assess your condition.  If your provider identifies any concerns that need to be evaluated in person or the need to arrange testing (such as labs, EKG, etc.), we will make arrangements to do so.     Although advances in technology are sophisticated, we cannot ensure that it will always work on either your end or our end.  If the connection with a video visit is poor, the visit may have to be switched to a telephone visit.  With either a video or telephone visit, we are not always able to ensure that we have a secure connection.     I need to obtain your verbal consent now.   Are you willing to proceed with your visit today?    Christian Parsons has provided verbal consent on 08/20/2021 for a virtual visit (video or telephone).   Mar Daring, PA-C   Date: 08/20/2021 7:43 AM   Virtual Visit via Video Note   I, Mar Daring, connected with  Christian Parsons  ([id number], Jul 29, 1946) on 08/20/21 at  8:05 AM EST by a video-enabled telemedicine application and verified that I am speaking with the correct person using two identifiers.  Location: Patient: Virtual Visit Location Patient: Home Provider: Virtual Visit Location Provider: Home Office   I discussed the limitations of evaluation and management by  telemedicine and the availability of in person appointments. The patient expressed understanding and agreed to proceed.    History of Present Illness: Christian Parsons is a 75 y.o. who identifies as a male who was assigned male at birth, and is being seen today for hematuria. Reports he has known "lithium cysts" in his kidneys and has passed them before without issue. Reports last night he started having symptoms consistent with previous passing of cysts with some hematuria and pain. Reports passing more than normal amounts of cysts last night (reports they are like jelly globs that came out) and having more than normal amounts of hematuria that has persisted until today. He does report feeling more weak and fatigued today.   Did take a PRN lorazepam last night. Was also recently started on losartan for HBP.    Problems:  Patient Active Problem List   Diagnosis Date Noted   Aortic aneurysm without rupture ([place]) 06/14/2021   Abdominal aortic aneurysm (AAA) without rupture 06/14/2021   Atherosclerosis of aorta ([place]) 11/18/2017   Left sided sciatica 11/18/2017   Pes planus 03/13/2017   Knee pain 03/11/2017   BPH with urinary obstruction 08/12/2016   CKD (chronic kidney disease) stage 2 or early 3 07/11/2015   Ventral hernia without obstruction or gangrene 02/17/2015   Diastasis recti 12/22/2014   Erectile dysfunction 12/22/2014   Benign  essential tremor 06/16/2013   Migraine headache with aura 12/25/2011   BMI 31.0-31.9,adult 12/25/2011   Right bundle branch block 12/25/2011   Mood disorder (HCC)     Allergies:  Allergies  Allergen Reactions   Levaquin [Levofloxacin] Other (See Comments)    Avoid fluoroquinolones if possible due to AAA.   Medications:  Current Outpatient Medications:    aspirin EC 81 MG tablet, Take 81 mg by mouth daily., Disp: , Rfl:    lamoTRIgine (LAMICTAL) 200 MG tablet, Take 1 tablet (200 mg total) by mouth daily., Disp: 90 tablet, Rfl: 3   LORazepam (ATIVAN) 0.5  MG tablet, Take 1 tablet (0.5 mg total) by mouth as needed., Disp: 30 tablet, Rfl: 0   losartan (COZAAR) 25 MG tablet, TAKE 1 TABLET (25 MG TOTAL) BY MOUTH DAILY., Disp: 90 tablet, Rfl: 0   rizatriptan (MAXALT) 10 MG tablet, TAKE 1 TABLET BY MOUTH AS NEEDED FOR MIGRAINE. MAY REPEAT IN 2 HOURS IF NEEDED, Disp: 10 tablet, Rfl: 2  Observations/Objective: Patient is well-developed, well-nourished in no acute distress.  Resting comfortably  at home.  Head is normocephalic, atraumatic.  No labored breathing.  Speech is clear and coherent with logical content.  Patient is alert and oriented at baseline.    Assessment and Plan: 1. Gross hematuria  - Advised patient to call Urologist for urgent appt today. If unable to be seen with Urology, he should seek care with Urgent Care or ER for further evaluation and blood work to r/o significant anemia.  Follow Up Instructions: I discussed the assessment and treatment plan with the patient. The patient was provided an opportunity to ask questions and all were answered. The patient agreed with the plan and demonstrated an understanding of the instructions.  A copy of instructions were sent to the patient via MyChart unless otherwise noted below.    The patient was advised to call back or seek an in-person evaluation if the symptoms worsen or if the condition fails to improve as anticipated.  Time:  I spent 8 minutes with the patient via telehealth technology discussing the above problems/concerns.    Mar Daring, PA-C

## 2021-08-27 ENCOUNTER — Other Ambulatory Visit: Payer: Self-pay

## 2021-08-27 ENCOUNTER — Ambulatory Visit: Payer: Medicare Other | Admitting: Cardiology

## 2021-08-27 ENCOUNTER — Encounter: Payer: Self-pay | Admitting: Cardiology

## 2021-08-27 VITALS — BP 142/89 | HR 64 | Temp 98.3°F | Resp 16 | Ht 72.0 in | Wt 219.0 lb

## 2021-08-27 DIAGNOSIS — I7121 Aneurysm of the ascending aorta, without rupture: Secondary | ICD-10-CM

## 2021-08-27 DIAGNOSIS — I700 Atherosclerosis of aorta: Secondary | ICD-10-CM

## 2021-08-27 DIAGNOSIS — R030 Elevated blood-pressure reading, without diagnosis of hypertension: Secondary | ICD-10-CM

## 2021-08-27 NOTE — Progress Notes (Signed)
Primary Physician/Referring:  Maximiano Coss, NP  Patient ID: Christian Parsons, male    DOB: 1946/05/08, 75 y.o.   MRN: [id number]  Chief Complaint  Patient presents with   AAA   Hypertension   Follow-up    6 week   Results    Lab results   HPI:    Christian Parsons  is a 75 y.o. 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  He has no history of hypertension, hyperlipidemia or tobacco use disorder or family history of aortic aneurysm.  Patient presents for 6-week follow-up.  At last office visit ordered echocardiogram to follow-up on ascending aortic aneurysm.  Echocardiogram revealed stable ascending aortic aneurysm at 4.5 cm, as well as preserved LVEF and mild LVH.  Also at last office visit started losartan 25 mg p.o. daily, Unfortunately repeat BMP normal lipid panel have not been done.  Patient is tolerating losartan without issue and is agreeable to getting lab work done after today's office visit.  He reports home blood pressure readings remain mildly elevated at 140/89 mmHg.  Past Medical History:  Diagnosis Date   Anxiety    BPH with urinary obstruction    s/p TURP   Chronic kidney disease    Depression    Stage 3a chronic kidney disease (CKD) (HCC)    secondary to lithium toxicity; baseline creatinine 1.3-1.6   Past Surgical History:  Procedure Laterality Date   HERNIA REPAIR     PROSTATE SURGERY     TRANSURETHRAL RESECTION OF PROSTATE     Family History  Problem Relation Age of Onset   Cancer Mother        breast cancer   Dementia Father    Arthritis Sister        hip OA s/p hip replacement    Social History   Tobacco Use   Smoking status: Never   Smokeless tobacco: Never  Substance Use Topics   Alcohol  use: Not Currently    Alcohol/week: 2.0 standard drinks    Types: 2 Standard drinks or equivalent per week    Comment: Stop drinking 6-7 months ago   Marital Status: Married  ROS  Review of Systems  Cardiovascular:  Negative for chest pain, claudication, dyspnea on exertion, leg swelling, near-syncope, orthopnea, palpitations, paroxysmal nocturnal dyspnea and syncope.  Neurological:  Negative for dizziness.  Objective  Blood pressure (!) 142/89, pulse 64, temperature 98.3 F (36.8 C), resp. rate 16, height 6' (1.829 m), weight 219 lb (99.3 kg), SpO2 99 %. Body mass index is 29.7 kg/m.  Vitals with BMI 08/27/2021 08/27/2021 07/16/2021  Height - 6\' 0"  -  Weight - 219 lbs -  BMI - 13.2 -  Systolic 440 102 725  Diastolic 89 84 92  Pulse 64 63 68     Physical Exam Vitals reviewed.  Neck:     Vascular: No carotid bruit or JVD.  Cardiovascular:     Rate and Rhythm: Normal rate and regular rhythm.     Pulses: Intact distal pulses.     Heart sounds: Normal heart sounds. No murmur heard.   No gallop.  Pulmonary:     Effort: Pulmonary effort is normal.     Breath sounds: Normal breath sounds.  Musculoskeletal:     Right lower leg: No edema.     Left lower leg: No edema.     Laboratory examination:   Recent Labs    12/12/20 0849 07/16/21 1249  NA 139 143  K 5.2* 4.5  CL 104 103  CO2 30 25  GLUCOSE 104* 99  BUN 29* 23  CREATININE 1.55* 1.63*  CALCIUM 9.8 9.7   CrCl cannot be calculated (Patient's most recent lab result is older than the maximum 21 days allowed.).  CMP Latest Ref Rng & Units 07/16/2021 12/12/2020 09/28/2019  Glucose 70 - 99 mg/dL 99 104(H) 92  BUN 8 - 27 mg/dL 23 29(H) 23  Creatinine 0.76 - 1.27 mg/dL 1.63(H) 1.55(H) 1.53(H)  Sodium 134 - 144 mmol/L 143 139 141  Potassium 3.5 - 5.2 mmol/L 4.5 5.2(H) 4.5  Chloride 96 - 106 mmol/L 103 104 104  CO2 20 - 29 mmol/L 25 30 24   Calcium 8.6 - 10.2 mg/dL 9.7 9.8 9.5  Total Protein 6.0 - 8.3 g/dL - 7.3 6.8   Total Bilirubin 0.2 - 1.2 mg/dL - 0.6 0.6  Alkaline Phos 39 - 117 U/L - 73 85  AST 0 - 37 U/L - 19 20  ALT 0 - 53 U/L - 21 25   CBC Latest Ref Rng & Units 12/12/2020 03/27/2020 06/04/2019  WBC 4.0 - 10.5 K/uL 4.5 5.1 5.4  Hemoglobin 13.0 - 17.0 g/dL 15.3 14.1 15.3  Hematocrit 39.0 - 52.0 % 44.1 40.6 44.4  Platelets 150.0 - 400.0 K/uL 141.0(L) - 153    Lipid Panel Recent Labs    12/12/20 0849  CHOL 134  TRIG 133.0  LDLCALC 70  VLDL 26.6  HDL 37.50*  CHOLHDL 4   Lipid Panel     Component Value Date/Time   CHOL 134 12/12/2020 0849   CHOL 124 03/27/2020 1428   TRIG 133.0 12/12/2020 0849   HDL 37.50 (L) 12/12/2020 0849   HDL 38 (L) 03/27/2020 1428   CHOLHDL 4 12/12/2020 0849   VLDL 26.6 12/12/2020 0849   LDLCALC 70 12/12/2020 0849   LDLCALC 69 03/27/2020 1428   LABVLDL 17 03/27/2020 1428     HEMOGLOBIN A1C Lab Results  Component Value Date   HGBA1C 5.6 12/12/2020   TSH No results for input(s): TSH in the last 8760 hours.  Allergies   Allergies  Allergen Reactions   Levaquin [Levofloxacin] Other (See Comments)    Avoid fluoroquinolones if possible due to AAA.    Medication prior to this encounter:   Outpatient Medications Prior to Visit  Medication Sig Dispense Refill   aspirin EC 81 MG tablet Take 81 mg by mouth daily.     lamoTRIgine (LAMICTAL) 200 MG tablet Take 1 tablet (200 mg total) by mouth daily. 90 tablet 3   LORazepam (ATIVAN) 0.5 MG tablet Take 1 tablet (0.5 mg total) by mouth as needed. 30 tablet 0   losartan (COZAAR) 25 MG tablet TAKE 1 TABLET (25 MG TOTAL) BY MOUTH DAILY. 90 tablet 0   rizatriptan (MAXALT) 10 MG tablet TAKE 1 TABLET BY MOUTH AS NEEDED FOR MIGRAINE. MAY REPEAT IN 2 HOURS IF NEEDED 10 tablet 2   No facility-administered medications prior to visit.    Medication list after today's encounter   Current Outpatient Medications  Medication Instructions   aspirin EC 81 mg, Oral, Daily   lamoTRIgine (LAMICTAL) 200 mg, Oral, Daily    LORazepam (ATIVAN) 0.5 mg, Oral, As needed   losartan (COZAAR) 25 mg, Oral, Daily  rizatriptan (MAXALT) 10 MG tablet TAKE 1 TABLET BY MOUTH AS NEEDED FOR MIGRAINE. MAY REPEAT IN 2 HOURS IF NEEDED   Radiology:   CT of the abdomen 10/20/2018: No evidence of AAA. Vascular/Lymphatic: Atherosclerotic calcifications of aorta and iliac arteries. Aorta normal caliber. No adenopathy.  MR angio of the chest 07/30/2019: 1. Mild fusiform aneurysmal dilatation of the ascending thoracic aorta measuring 44 mm in maximal diameter. Recommend annual imaging followup by CTA or MRA. 2. Borderline cardiomegaly. 3. Previously characterized benign hepatic hemangioma is morphologically unchanged compared to remote abdominal CT 04/2014.  Cardiac Studies:   Exercise nuclear stress test 06/23/2019: Patient exercised on Bruce protocol for 9 minutes and achieved 10.1 METS. Normal nuclear perfusion, LVEF 57%, low risk.  Echocardiogram 07/19/2021:  Normal LV systolic function with visual EF 60-65%. Left ventricle cavity  is normal in size. Mild left ventricular hypertrophy. Normal global wall  motion. Normal diastolic filling pattern, normal LAP.  Aortic valve sclerosis without stenosis. Trace aortic regurgitation.  No significant valvular abnormalities.  The aortic root  and proximal aorta are dilated (Sinus of Valsalva 64mm,  Sinotubular junction 71mm, and proximal aorta 29mm).  Compared to study 05/10/2019: no significant change and ascending aorta  measured 53mm.  EKG:   07/16/2021: Sinus rhythm with first-degree AV block at rate of 60 bpm, left atrial enlargement, right bundle branch block.  Compared to 08/15/2020, first-degree AV block is new.  Assessment     ICD-10-CM   1. Aneurysm of ascending aorta without rupture  I71.21     2. Aortic atherosclerosis (HCC)  I70.0     3. Elevated blood pressure reading in office without diagnosis of hypertension  R03.0        There are no discontinued  medications.  No orders of the defined types were placed in this encounter.   No orders of the defined types were placed in this encounter.  Recommendations:   Christian Parsons is a 75 y.o.  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  He has no history of hypertension, hyperlipidemia or tobacco use disorder or family history of aortic aneurysm.  Patient presents for 6-week follow-up.  At last office visit ordered echocardiogram to follow-up on ascending aortic aneurysm.  Echocardiogram revealed stable ascending aortic aneurysm at 4.5 cm, as well as preserved LVEF and mild LVH.  Also at last office visit started losartan 25 mg p.o. daily.  Will obtain repeat BMP and lipid profile testing as previously ordered.  Patient's blood pressure remains elevated above goal, will therefore enroll him in remote patient monitoring and consider up titration of antihypertensive medications as appropriate renal function allows.  Reviewed and discussed results of echocardiogram, details above.  Patient's questions were addressed to his satisfaction.  We will plan to continue surveillance.  In regard to aortic atherosclerosis we will obtain repeat lipid profile testing, could certainly consider low-dose statin therapy.  No changes were made to medications at today's office visit.  Follow-up in 3 months, sooner if needed, for blood pressure management and aortic atherosclerosis.   Alethia Berthold, PA-C 08/27/2021, 4:12 PM Office: [phone]

## 2021-08-28 LAB — BASIC METABOLIC PANEL
BUN/Creatinine Ratio: 12 (ref 10–24)
BUN: 20 mg/dL (ref 8–27)
CO2: 23 mmol/L (ref 20–29)
Calcium: 8.2 mg/dL — ABNORMAL LOW (ref 8.6–10.2)
Chloride: 102 mmol/L (ref 96–106)
Creatinine, Ser: 1.61 mg/dL — ABNORMAL HIGH (ref 0.76–1.27)
Glucose: 83 mg/dL (ref 70–99)
Potassium: 4.5 mmol/L (ref 3.5–5.2)
Sodium: 140 mmol/L (ref 134–144)
eGFR: 45 mL/min/{1.73_m2} — ABNORMAL LOW (ref >59)

## 2021-08-28 LAB — LIPID PANEL WITH LDL/HDL RATIO
Cholesterol, Total: 148 mg/dL (ref 100–199)
HDL: 38 mg/dL — ABNORMAL LOW (ref >39)
LDL Chol Calc (NIH): 92 mg/dL (ref 0–99)
LDL/HDL Ratio: 2.4 ratio (ref 0.0–3.6)
Triglycerides: 96 mg/dL (ref 0–149)
VLDL Cholesterol Cal: 18 mg/dL (ref 5–40)

## 2021-09-27 ENCOUNTER — Other Ambulatory Visit: Payer: Self-pay

## 2021-09-27 DIAGNOSIS — N1831 Chronic kidney disease, stage 3a: Secondary | ICD-10-CM

## 2021-09-27 MED ORDER — LOSARTAN POTASSIUM 25 MG PO TABS: 25.0000 mg | ORAL_TABLET | Freq: Every day | ORAL | 1 refills | Status: DC

## 2021-11-09 ENCOUNTER — Ambulatory Visit (INDEPENDENT_AMBULATORY_CARE_PROVIDER_SITE_OTHER): Payer: Medicare Other | Admitting: Registered Nurse

## 2021-11-09 ENCOUNTER — Other Ambulatory Visit: Payer: Self-pay

## 2021-11-09 ENCOUNTER — Encounter: Payer: Self-pay | Admitting: Registered Nurse

## 2021-11-09 VITALS — BP 117/70 | HR 68 | Temp 98.1°F | Resp 18 | Ht 72.0 in | Wt 223.2 lb

## 2021-11-09 DIAGNOSIS — R310 Gross hematuria: Secondary | ICD-10-CM

## 2021-11-09 DIAGNOSIS — Q6102 Congenital multiple renal cysts: Secondary | ICD-10-CM | POA: Diagnosis not present

## 2021-11-09 NOTE — Patient Instructions (Addendum)
Mr. Gullett -  Doristine Devoid to see you  Call if you need anything  Referral to [facility] placed  Will CC Dr. Einar Gip and let you know how that conversation goes  Thanks,  Denice Paradise     If you have lab work done today you will be contacted with your lab results within the next 2 weeks.  If you have not heard from [place] then please contact us. The fastest way to get your results is to register for My Chart.   IF you received an x-ray today, you will receive an invoice from [facility]. Please contact [facility] at [phone] with questions or concerns regarding your invoice.   IF you received labwork today, you will receive an invoice from [facility]. Please contact [facility] at [phone] with questions or concerns regarding your invoice.   Our billing staff will not be able to assist you with questions regarding bills from these companies.  You will be contacted with the lab results as soon as they are available. The fastest way to get your results is to activate your My Chart account. Instructions are located on the last page of this paperwork. If you have not heard from [place] regarding the results in 2 weeks, please contact this office.

## 2021-11-09 NOTE — Progress Notes (Signed)
Established Patient Office Visit  Subjective:  Patient ID: Christian Parsons, male    DOB: 1946/06/19  Age: 76 y.o. MRN: [id number]  CC:  Chief Complaint  Patient presents with   Referral    Patient states he would like to discuss getting a referral for blood being in his urine and also some cyst. Patient states he was told that he needs surgery but would like a second opinion.    HPI Christian Parsons presents for referral  Hx of ckd and renal cysts after long term lithium use. Passes them occasionally. Had a particularly rough course in Dec. Has resolved spontaneously. Was suggested to him to have cystoscopy but he would prefer consult with renal first. He had been followed by [facility] but has not been seen by them in some time.  Per review of notes from [facility] - apparently [facility] Alinda Money had been unaware of hematuria, though Mr. Christian Parsons he has discussed this with them many times. He is noted to have large post void residual.   Otherwise no acute concerns.   Past Medical History:  Diagnosis Date   Anxiety    BPH with urinary obstruction    s/p TURP   Chronic kidney disease    Depression    Stage 3a chronic kidney disease (CKD) (HCC)    secondary to lithium toxicity; baseline creatinine 1.3-1.6    Past Surgical History:  Procedure Laterality Date   HERNIA REPAIR     PROSTATE SURGERY     TRANSURETHRAL RESECTION OF PROSTATE      Family History  Problem Relation Age of Onset   Cancer Mother        breast cancer   Dementia Father    Arthritis Sister        hip OA s/p hip replacement    Social History   Socioeconomic History   Marital status: Married    Spouse name: Horris Latino   Number of children: 1   Years of education: 16+   Highest education level: Professional school degree (e.g., MD, DDS, DVM, JD)  Occupational History   Occupation: retired    Comment: Music therapist  Tobacco Use   Smoking status: Never   Smokeless tobacco: Never  Brewing technologist Use: Never used  Substance and Sexual Activity   Alcohol use: Not Currently    Alcohol/week: 2.0 standard drinks    Types: 2 Standard drinks or equivalent per week    Comment: Stop drinking 6-7 months ago   Drug use: No   Sexual activity: Yes    Partners: Female  Other Topics Concern   Not on file  Social History Narrative   Lives with Wife   Right Handed   Drinks 2-3 cups caffeine daily   Social Determinants of Health   Financial Resource Strain: Low Risk    Difficulty of Paying Living Expenses: Not hard at all  Food Insecurity: No Food Insecurity   Worried About Charity fundraiser in the Last Year: Never true   Arboriculturist in the Last Year: Never true  Transportation Needs: No Transportation Needs   Lack of Transportation (Medical): No   Lack of Transportation (Non-Medical): No  Physical Activity: Sufficiently Active   Days of Exercise per Week: 5 days   Minutes of Exercise per Session: 60 min  Stress: No Stress Concern Present   Feeling of Stress : Not at all  Social Connections: Socially Integrated   Frequency of  Communication with Friends and Family: More than three times a week   Frequency of Social Gatherings with Friends and Family: More than three times a week   Attends Religious Services: More than 4 times per year   Active Member of [employer] or Organizations: Yes   Attends Music therapist: More than 4 times per year   Marital Status: Married  Human resources officer Violence: Not on file    Outpatient Medications Prior to Visit  Medication Sig Dispense Refill   aspirin EC 81 MG tablet Take 81 mg by mouth daily.     lamoTRIgine (LAMICTAL) 200 MG tablet Take 1 tablet (200 mg total) by mouth daily. 90 tablet 3   LORazepam (ATIVAN) 0.5 MG tablet Take 1 tablet (0.5 mg total) by mouth as needed. 30 tablet 0   losartan (COZAAR) 25 MG tablet Take 1 tablet (25 mg total) by mouth daily. 90 tablet 1   rizatriptan (MAXALT) 10 MG tablet TAKE 1 TABLET BY  MOUTH AS NEEDED FOR MIGRAINE. MAY REPEAT IN 2 HOURS IF NEEDED 10 tablet 2   No facility-administered medications prior to visit.    Allergies  Allergen Reactions   Levaquin [Levofloxacin] Other (See Comments)    Avoid fluoroquinolones if possible due to AAA.    ROS Review of Systems  Constitutional: Negative.   HENT: Negative.    Eyes: Negative.   Respiratory: Negative.    Cardiovascular: Negative.   Gastrointestinal: Negative.   Genitourinary: Negative.   Musculoskeletal: Negative.   Skin: Negative.   Neurological: Negative.   Psychiatric/Behavioral: Negative.    All other systems reviewed and are negative.    Objective:    Physical Exam Constitutional:      General: He is not in acute distress.    Appearance: Normal appearance. He is normal weight. He is not ill-appearing, toxic-appearing or diaphoretic.  Cardiovascular:     Rate and Rhythm: Normal rate and regular rhythm.     Heart sounds: Normal heart sounds. No murmur heard.   No friction rub. No gallop.  Pulmonary:     Effort: Pulmonary effort is normal. No respiratory distress.     Breath sounds: Normal breath sounds. No stridor. No wheezing, rhonchi or rales.  Chest:     Chest wall: No tenderness.  Neurological:     General: No focal deficit present.     Mental Status: He is alert and oriented to person, place, and time. Mental status is at baseline.  Psychiatric:        Mood and Affect: Mood normal.        Behavior: Behavior normal.        Thought Content: Thought content normal.        Judgment: Judgment normal.    BP 117/70    Pulse 68    Temp 98.1 F (36.7 C) (Temporal)    Resp 18    Ht 6' (1.829 m)    Wt 223 lb 3.2 oz (101.2 kg)    SpO2 98%    BMI 30.27 kg/m  Wt Readings from Last 3 Encounters:  11/09/21 223 lb 3.2 oz (101.2 kg)  08/27/21 219 lb (99.3 kg)  07/16/21 217 lb 12.8 oz (98.8 kg)     Health Maintenance Due  Topic Date Due   Zoster Vaccines- Shingrix (1 of 2) Never done   COVID-19  Vaccine (4 - Booster for Pfizer series) 07/14/2020    There are no preventive care reminders to display for this patient.  Lab Results  Component Value Date   TSH 1.006 08/25/2015   Lab Results  Component Value Date   WBC 4.5 12/12/2020   HGB 15.3 12/12/2020   HCT 44.1 12/12/2020   MCV 94.3 12/12/2020   PLT 141.0 (L) 12/12/2020   Lab Results  Component Value Date   NA 140 08/27/2021   K 4.5 08/27/2021   CO2 23 08/27/2021   GLUCOSE 83 08/27/2021   BUN 20 08/27/2021   CREATININE 1.61 (H) 08/27/2021   BILITOT 0.6 12/12/2020   ALKPHOS 73 12/12/2020   AST 19 12/12/2020   ALT 21 12/12/2020   PROT 7.3 12/12/2020   ALBUMIN 4.4 12/12/2020   CALCIUM 8.2 (L) 08/27/2021   EGFR 45 (L) 08/27/2021   GFR 44.37 (L) 12/12/2020   Lab Results  Component Value Date   CHOL 148 08/27/2021   Lab Results  Component Value Date   HDL 38 (L) 08/27/2021   Lab Results  Component Value Date   LDLCALC 92 08/27/2021   Lab Results  Component Value Date   TRIG 96 08/27/2021   Lab Results  Component Value Date   CHOLHDL 4 12/12/2020   Lab Results  Component Value Date   HGBA1C 5.6 12/12/2020      Assessment & Plan:   Problem List Items Addressed This Visit   None Visit Diagnoses     Gross hematuria    -  Primary   Relevant Orders   Ambulatory referral to Nephrology   Multiple renal cysts       Relevant Orders   Ambulatory referral to Nephrology       No orders of the defined types were placed in this encounter.   Follow-up: Return if symptoms worsen or fail to improve.   PLAN Refer to nephrology Discussed cystoscopy, concerning etiologies, reasons to return to urology  Patient encouraged to call clinic with any questions, comments, or concerns.  Maximiano Coss, NP

## 2021-11-11 ENCOUNTER — Encounter: Payer: Self-pay | Admitting: Registered Nurse

## 2021-11-12 NOTE — Telephone Encounter (Signed)
Patient has some questions about a Tax adviser. Please Advise    Are you the right person to substitute Dr. Nadyne Coombes (sp?) for Dr. Marlou Porch as my cardiologist?   I look forward to hearing from [facility] nephrology on Monday.   Thanks.   [name]

## 2021-11-27 NOTE — Progress Notes (Signed)
? ?Primary Physician/Referring:  Maximiano Coss, NP ? ?Patient ID: Christian Parsons, male    DOB: 1946/03/04, 76 y.o.   MRN: [id number] ? ?Chief Complaint  ?Patient presents with  ? aortic atherosclerosis  ? Hypertension  ?  3 month  ? ?HPI:   ? ?Christian Parsons  is a 76 y.o. 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  He has no history of hypertension, hyperlipidemia or tobacco use disorder or family history of aortic aneurysm. ? ?Patient presents for 37-monthfollow-up.  Repeat BMP and lipid profile testing revealed kidney function is stable on losartan and lipids are fairly well controlled.  Patient is feeling well overall without specific complaints today.  He is currently being evaluated by urology and nephrology for blood in his urine.  Denies chest pain, abdominal pain, dyspnea. ? ?Past Medical History:  ?Diagnosis Date  ? Anxiety   ? BPH with urinary obstruction   ? s/p TURP  ? Chronic kidney disease   ? Depression   ? Stage 3a chronic [place] disease ([place]D) [place]   ? secondary to lithium toxicity; baseline creatinine 1.3-1.6  ? ?Past Surgical History:  ?Procedure Laterality Date  ? HERNIA REPAIR    ? PROSTATE SURGERY    ? TRANSURETHRAL RESECTION OF PROSTATE    ? ?Family History  ?Problem Relation Age of Onset  ? Cancer Mother   ?     breast cancer  ? Dementia Father   ? Arthritis Sister   ?     hip OA s/p hip replacement  ?  ?Social History  ? ?Tobacco Use  ? Smoking status: Never  ? Smokeless tobacco: Never  ?Substance Use Topics  ? Alcohol use: Not Currently  ?  Alcohol/week: 2.0 standard drinks  ?  Types: 2 Standard drinks or equivalent per week  ?  Comment: Stop drinking 6-7 months ago  ? ?Marital Status: Married  ?ROS  ?Review of Systems   ?Cardiovascular:  Negative for chest pain, claudication, dyspnea on exertion, leg swelling, near-syncope, orthopnea, palpitations, paroxysmal nocturnal dyspnea and syncope.  ?Neurological:  Negative for dizziness.  ?Objective  ?Blood pressure 122/73, pulse 69, temperature 98.7 ?F (37.1 ?C), temperature source Temporal, resp. rate 17, height 6' (1.829 m), weight 227 lb 6.4 oz (103.1 kg), SpO2 99 %. Body mass index is 30.84 kg/m?.  ?Vitals with BMI 11/28/2021 11/09/2021 08/27/2021  ?Height '6\' 0"'$  '6\' 0"'$  -  ?Weight 227 lbs 6 oz 223 lbs 3 oz -  ?BMI 30.83 30.26 -  ?Systolic [id number] ?Diastolic 73 70 89  ?Pulse 69 68 64  ?  ? Physical Exam ?Vitals reviewed.  ?Neck:  ?   Vascular: No carotid bruit or JVD.  ?Cardiovascular:  ?   Rate and Rhythm: Normal rate and regular rhythm.  ?   Pulses: Intact distal pulses.  ?   Heart sounds: Normal heart sounds, S1 normal and S2 normal. No murmur heard. ?  No gallop.  ?Pulmonary:  ?   Effort: Pulmonary effort is normal. No respiratory distress.  ?   Breath sounds: Normal breath sounds. No wheezing, rhonchi or rales.  ?Musculoskeletal:  ?   Right lower leg: No edema.  ?   Left lower leg: No edema.  ?  Neurological:  ?   Mental Status: He is alert.  ?  ? ?Laboratory examination:  ? ?Recent Labs  ?  12/12/20 ?5697 07/16/21 ?1249 08/27/21 ?1632  ?NA 139 143 140  ?K 5.2* 4.5 4.5  ?CL 104 103 102  ?CO2 '30 25 23  '$ ?GLUCOSE 104* 99 83  ?BUN 29* 23 20  ?CREATININE 1.55* 1.63* 1.61*  ?CALCIUM 9.8 9.7 8.2*  ? ?CrCl cannot be calculated (Patient's most recent lab result is older than the maximum 21 days allowed.).  ?CMP Latest Ref Rng & Units 08/27/2021 07/16/2021 12/12/2020  ?Glucose 70 - 99 mg/dL 83 99 104(H)  ?BUN 8 - 27 mg/dL 20 23 29(H)  ?Creatinine 0.76 - 1.27 mg/dL 1.61(H) 1.63(H) 1.55(H)  ?Sodium 134 - 144 mmol/L 140 143 139  ?Potassium 3.5 - 5.2 mmol/L 4.5 4.5 5.2(H)  ?Chloride 96 - 106 mmol/L 102 103 104  ?CO2 20 - 29 mmol/L '23 25 30  '$ ?Calcium 8.6 - 10.2 mg/dL 8.2(L) 9.7 9.8  ?Total  Protein 6.0 - 8.3 g/dL - - 7.3  ?Total Bilirubin 0.2 - 1.2 mg/dL - - 0.6  ?Alkaline Phos 39 - 117 U/L - - 73  ?AST 0 - 37 U/L - - 19  ?ALT 0 - 53 U/L - - 21  ? ?CBC Latest Ref Rng & Units 12/12/2020 03/27/2020 06/04/2019  ?WBC 4.0 - 10.5 K/uL 4.5 5.1 5.4  ?Hemoglobin 13.0 - 17.0 g/dL 15.3 14.1 15.3  ?Hematocrit 39.0 - 52.0 % 44.1 40.6 44.4  ?Platelets 150.0 - 400.0 K/uL 141.0(L) - 153  ? ? ?Lipid Panel ?Recent Labs  ?  12/12/20 ?0849 08/27/21 ?9480  ?CHOL 134 148  ?TRIG 133.0 96  ?[place] 70 92  ?VLDL 26.6  --   ?HDL 37.50* 38*  ?CHOLHDL 4  --   ? ?Lipid Panel  ?   ?Component Value Date/Time  ? CHOL 148 08/27/2021 1635  ? TRIG 96 08/27/2021 1635  ? HDL 38 (L) 08/27/2021 1635  ? CHOLHDL 4 12/12/2020 0849  ? VLDL 26.6 12/12/2020 0849  ? [place] 92 08/27/2021 1635  ? LABVLDL 18 08/27/2021 1635  ?  ? ?HEMOGLOBIN A1C ?Lab Results  ?Component Value Date  ? HGBA1C 5.6 12/12/2020  ? ?TSH ?No results for input(s): TSH in the last 8760 hours. ? ?Allergies  ? ?Allergies  ?Allergen Reactions  ? Levaquin [Levofloxacin] Other (See Comments)  ?  Avoid fluoroquinolones if possible due to AAA.  ?  ?Medication prior to this encounter:  ? ?Outpatient Medications Prior to Visit  ?Medication Sig Dispense Refill  ? aspirin EC 81 MG tablet Take 81 mg by mouth daily.    ? lamoTRIgine (LAMICTAL) 150 MG tablet Take 150 mg by mouth daily.    ? LORazepam (ATIVAN) 0.5 MG tablet Take 1 tablet (0.5 mg total) by mouth as needed. 30 tablet 0  ? losartan (COZAAR) 25 MG tablet Take 1 tablet (25 mg total) by mouth daily. 90 tablet 1  ? polyethylene glycol (MIRALAX / GLYCOLAX) 17 g packet Take 17 g by mouth as needed.    ? rizatriptan (MAXALT) 10 MG tablet TAKE 1 TABLET BY MOUTH AS NEEDED FOR MIGRAINE. MAY REPEAT IN 2 HOURS IF NEEDED 10 tablet 2  ? lamoTRIgine (LAMICTAL) 200 MG tablet Take 1 tablet (200 mg total) by mouth daily. (Patient taking differently: Take 150 mg by mouth daily.) 90 tablet 3  ? ?No facility-administered medications prior to visit.   ?  ?Medication list after today's encounter  ? ?Current Outpatient  Medications  ?Medication Instructions  ? aspirin EC 81 mg, Oral, Daily  ? lamoTRIgine (LAMICTAL) 150 mg, Oral, Daily  ? LORazepam (ATIVAN) 0.5 mg, Oral, As needed  ? losartan (COZAAR) 25 mg, Oral, Daily  ? polyethylene glycol (MIRALAX / GLYCOLAX) 17 g, Oral, As needed  ? rizatriptan (MAXALT) 10 MG tablet TAKE 1 TABLET BY MOUTH AS NEEDED FOR MIGRAINE. MAY REPEAT IN 2 HOURS IF NEEDED  ? ?Radiology:  ? ?CT of the abdomen 10/20/2018: ?No evidence of AAA. ?Vascular/Lymphatic: Atherosclerotic calcifications of aorta and ?iliac arteries. Aorta normal caliber. No adenopathy. ? ?MR angio of the chest 07/30/2019: ?1. Mild fusiform aneurysmal dilatation of the ascending thoracic ?aorta measuring 44 mm in maximal diameter. Recommend annual imaging followup by CTA or MRA. ?2. Borderline cardiomegaly. ?3. Previously characterized benign hepatic hemangioma is ?morphologically unchanged compared to remote abdominal CT 04/2014. ? ?Cardiac Studies:  ? ?Exercise nuclear stress test 06/23/2019: ?Patient exercised on Bruce protocol for 9 minutes and achieved 10.1 METS. ?Normal nuclear perfusion, LVEF 57%, low risk. ? ?Echocardiogram 07/19/2021:  ?Normal LV systolic function with visual EF 60-65%. Left ventricle cavity  ?is normal in size. Mild left ventricular hypertrophy. Normal global wall  ?motion. Normal diastolic filling pattern, normal LAP.  ?Aortic valve sclerosis without stenosis. Trace aortic regurgitation.  ?No significant valvular abnormalities.  ?The aortic root  and proximal aorta are dilated (Sinus of Valsalva 85m,  ?Sinotubular junction 455m and proximal aorta 4220m  ?Compared to study 05/10/2019: no significant change and ascending aorta measured 42m14m ?EKG:  ? ?07/16/2021: Sinus rhythm with first-degree AV block at rate of 60 bpm, left atrial enlargement, right bundle branch block.  Compared to 08/15/2020, first-degree AV block is new. ? ?Assessment   ? ?  ICD-10-CM   ?1. Aneurysm of ascending aorta without rupture  I71.21 PCV ECHOCARDIOGRAM COMPLETE  ?  ?2. Elevated blood pressure reading in office without diagnosis of hypertension  R03.0   ?  ?  ? ?Medications [place]

## 2021-11-28 ENCOUNTER — Other Ambulatory Visit: Payer: Self-pay

## 2021-11-28 ENCOUNTER — Encounter: Payer: Self-pay | Admitting: Student

## 2021-11-28 ENCOUNTER — Ambulatory Visit: Payer: Medicare Other | Admitting: Student

## 2021-11-28 VITALS — BP 122/73 | HR 69 | Temp 98.7°F | Resp 17 | Ht 72.0 in | Wt 227.4 lb

## 2021-11-28 DIAGNOSIS — R030 Elevated blood-pressure reading, without diagnosis of hypertension: Secondary | ICD-10-CM

## 2021-11-28 DIAGNOSIS — I7121 Aneurysm of the ascending aorta, without rupture: Secondary | ICD-10-CM

## 2021-12-24 ENCOUNTER — Other Ambulatory Visit: Payer: Self-pay | Admitting: Registered Nurse

## 2021-12-24 DIAGNOSIS — G43109 Migraine with aura, not intractable, without status migrainosus: Secondary | ICD-10-CM

## 2021-12-24 NOTE — Telephone Encounter (Signed)
Patient is requesting a refill of the following medications: ?Requested Prescriptions  ? ?Pending Prescriptions Disp Refills  ? rizatriptan (MAXALT) 10 MG tablet [Pharmacy Med Name: RIZATRIPTAN 10 MG TABLET] 10 tablet 2  ?  Sig: TAKE 1 TABLET BY MOUTH AS NEEDED FOR MIGRAINE. MAY REPEAT IN 2 HOURS IF NEEDED  ? ? ?Date of patient request: 12/24/21 ?Last office visit: 11/09/21 ?Date of last refill: 05/22/21 ?Last refill amount: 10 ? ?

## 2022-01-01 ENCOUNTER — Telehealth: Payer: Self-pay | Admitting: Registered Nurse

## 2022-01-01 NOTE — Telephone Encounter (Signed)
Patient called stating that he has some family members that want a male provider. Gave him [facility], [place], [facility] but wants [facility] recommendations also. Advised patient to reach out to each office.  ?

## 2022-01-04 NOTE — Telephone Encounter (Signed)
I recommend Sherrie Mustache, NP, Marlowe Sax, NP, Pricilla Holm, MD, or Jeralyn Ruths, NP ? ?Thanks, ? ? ?[place]

## 2022-03-04 ENCOUNTER — Ambulatory Visit: Payer: Medicare Other

## 2022-03-04 ENCOUNTER — Other Ambulatory Visit: Payer: Self-pay

## 2022-03-07 ENCOUNTER — Ambulatory Visit (INDEPENDENT_AMBULATORY_CARE_PROVIDER_SITE_OTHER): Payer: Medicare Other

## 2022-03-07 DIAGNOSIS — Z0000 Encounter for general adult medical examination without abnormal findings: Secondary | ICD-10-CM | POA: Diagnosis not present

## 2022-03-07 NOTE — Patient Instructions (Signed)
Christian Parsons , Thank you for taking time to come for your Medicare Wellness Visit. I appreciate your ongoing commitment to your health goals. Please review the following plan we discussed and let me know if I can assist you in the future.   Screening recommendations/referrals: Colonoscopy: 11/2021  Christian Parsons per patient  Recommended yearly ophthalmology/optometry visit for glaucoma screening and checkup Recommended yearly dental visit for hygiene and checkup  Vaccinations: Influenza vaccine: completed  Pneumococcal vaccine: completed  Tdap vaccine: 06/16/2013 Shingles vaccine: Completed [facility] at [facility]     Advanced directives: yes   Conditions/risks identified: none   Next appointment: none   Preventive Care 74 Years and Older, Male Preventive care refers to lifestyle choices and visits with your health care provider that can promote health and wellness. What does preventive care include? A yearly physical exam. This is also called an annual well check. Dental exams once or twice a year. Routine eye exams. Ask your health care provider how often you should have your eyes checked. Personal lifestyle choices, including: Daily care of your teeth and gums. Regular physical activity. Eating a healthy diet. Avoiding tobacco and drug use. Limiting alcohol use. Practicing safe sex. Taking low doses of aspirin every day. Taking vitamin and mineral supplements as recommended by your health care provider. What happens during an annual well check? The services and screenings done by your health care provider during your annual well check will depend on your age, overall health, lifestyle risk factors, and family history of disease. Counseling  Your health care provider may ask you questions about your: Alcohol use. Tobacco use. Drug use. Emotional well-being. Home and relationship well-being. Sexual activity. Eating habits. History of falls. Memory and ability to understand  (cognition). Work and work Statistician. Screening  You may have the following tests or measurements: Height, weight, and BMI. Blood pressure. Lipid and cholesterol levels. These may be checked every 5 years, or more frequently if you are over 22 years old. Skin check. Lung cancer screening. You may have this screening every year starting at age 42 if you have a 30-pack-year history of smoking and currently smoke or have quit within the past 15 years. Fecal occult blood test (FOBT) of the stool. You may have this test every year starting at age 70. Flexible sigmoidoscopy or colonoscopy. You may have a sigmoidoscopy every 5 years or a colonoscopy every 10 years starting at age 41. Prostate cancer screening. Recommendations will vary depending on your family history and other risks. Hepatitis C blood test. Hepatitis B blood test. Sexually transmitted disease (STD) testing. Diabetes screening. This is done by checking your blood sugar (glucose) after you have not eaten for a while (fasting). You may have this done every 1-3 years. Abdominal aortic aneurysm (AAA) screening. You may need this if you are a current or former smoker. Osteoporosis. You may be screened starting at age 55 if you are at high risk. Talk with your health care provider about your test results, treatment options, and if necessary, the need for more tests. Vaccines  Your health care provider may recommend certain vaccines, such as: Influenza vaccine. This is recommended every year. Tetanus, diphtheria, and acellular pertussis (Tdap, Td) vaccine. You may need a Td booster every 10 years. Zoster vaccine. You may need this after age 59. Pneumococcal 13-valent conjugate (PCV13) vaccine. One dose is recommended after age 46. Pneumococcal polysaccharide (PPSV23) vaccine. One dose is recommended after age 80. Talk to your health care provider about which  screenings and vaccines you need and how often you need them. This  information is not intended to replace advice given to you by your health care provider. Make sure you discuss any questions you have with your health care provider. Document Released: 09/29/2015 Document Revised: 05/22/2016 Document Reviewed: 07/04/2015 Elsevier Interactive Patient Education  2017 [place] Prevention in the Home Falls can cause injuries. They can happen to people of all ages. There are many things you can do to make your home safe and to help prevent falls. What can I do on the outside of my home? Regularly fix the edges of walkways and driveways and fix any cracks. Remove anything that might make you trip as you walk through a door, such as a raised step or threshold. Trim any bushes or trees on the path to your home. Use bright outdoor lighting. Clear any walking paths of anything that might make someone trip, such as rocks or tools. Regularly check to see if handrails are loose or broken. Make sure that both sides of any steps have handrails. Any raised decks and porches should have guardrails on the edges. Have any leaves, snow, or ice cleared regularly. Use sand or salt on walking paths during winter. Clean up any spills in your garage right away. This includes oil or grease spills. What can I do in the bathroom? Use night lights. Install grab bars by the toilet and in the tub and shower. Do not use towel bars as grab bars. Use non-skid mats or decals in the tub or shower. If you need to sit down in the shower, use a plastic, non-slip stool. Keep the floor dry. Clean up any water that spills on the floor as soon as it happens. Remove soap buildup in the tub or shower regularly. Attach bath mats securely with double-sided non-slip rug tape. Do not have throw rugs and other things on the floor that can make you trip. What can I do in the bedroom? Use night lights. Make sure that you have a light by your bed that is easy to reach. Do not use any sheets or  blankets that are too big for your bed. They should not hang down onto the floor. Have a firm chair that has side arms. You can use this for support while you get dressed. Do not have throw rugs and other things on the floor that can make you trip. What can I do in the kitchen? Clean up any spills right away. Avoid walking on wet floors. Keep items that you use a lot in easy-to-reach places. If you need to reach something above you, use a strong step stool that has a grab bar. Keep electrical cords out of the way. Do not use floor polish or wax that makes floors slippery. If you must use wax, use non-skid floor wax. Do not have throw rugs and other things on the floor that can make you trip. What can I do with my stairs? Do not leave any items on the stairs. Make sure that there are handrails on both sides of the stairs and use them. Fix handrails that are broken or loose. Make sure that handrails are as long as the stairways. Check any carpeting to make sure that it is firmly attached to the stairs. Fix any carpet that is loose or worn. Avoid having throw rugs at the top or bottom of the stairs. If you do have throw rugs, attach them to the floor with carpet  tape. Make sure that you have a light switch at the top of the stairs and the bottom of the stairs. If you do not have them, ask someone to add them for you. What else can I do to help prevent falls? Wear shoes that: Do not have high heels. Have rubber bottoms. Are comfortable and fit you well. Are closed at the toe. Do not wear sandals. If you use a stepladder: Make sure that it is fully opened. Do not climb a closed stepladder. Make sure that both sides of the stepladder are locked into place. Ask someone to hold it for you, if possible. Clearly mark and make sure that you can see: Any grab bars or handrails. First and last steps. Where the edge of each step is. Use tools that help you move around (mobility aids) if they are  needed. These include: Canes. Walkers. Scooters. Crutches. Turn on the lights when you go into a dark area. Replace any light bulbs as soon as they burn out. Set up your furniture so you have a clear path. Avoid moving your furniture around. If any of your floors are uneven, fix them. If there are any pets around you, be aware of where they are. Review your medicines with your doctor. Some medicines can make you feel dizzy. This can increase your chance of falling. Ask your doctor what other things that you can do to help prevent falls. This information is not intended to replace advice given to you by your health care provider. Make sure you discuss any questions you have with your health care provider. Document Released: 06/29/2009 Document Revised: 02/08/2016 Document Reviewed: 10/07/2014 Elsevier Interactive Patient Education  2017 [employer].

## 2022-03-07 NOTE — Progress Notes (Cosign Needed)
Subjective:   Christian Parsons is a 76 y.o. male who presents for an Initial Medicare Annual Wellness Visit.   I connected with Christian Parsons   today by telephone and verified that I am speaking with the correct person using two identifiers. Location patient: home Location provider: work Persons participating in the virtual visit: patient, provider.   I discussed the limitations, risks, security and privacy concerns of performing an evaluation and management service by telephone and the availability of in person appointments. I also discussed with the patient that there may be a patient responsible charge related to this service. The patient expressed understanding and verbally consented to this telephonic visit.    Interactive audio and video telecommunications were attempted between this provider and patient, however failed, due to patient having technical difficulties OR patient did not have access to video capability.  We continued and completed visit with audio only.    Review of Systems           Objective:    Today's Vitals   There is no height or weight on file to calculate BMI.     03/07/2022    3:50 PM 02/26/2021   11:17 AM 11/17/2020    9:26 AM 08/05/2017    9:25 AM 06/10/2017    2:11 PM 04/24/2017    9:02 AM 08/21/2016    9:35 AM  Advanced Directives  Does Patient Have a Medical Advance Directive? Yes Yes Yes Yes No Yes No  Type of Paramedic of [place];Living will Living will;Healthcare Power of [place];Living will [place];Living will  [place];Living will   Does patient want to make changes to medical advance directive?   No - Patient declined      Copy of [place] in Chart? No - copy requested No - copy requested No - copy requested No - copy requested       Current Medications (verified) Outpatient Encounter Medications as of 03/07/2022  Medication Sig    aspirin EC 81 MG tablet Take 81 mg by mouth daily.   lamoTRIgine (LAMICTAL) 150 MG tablet Take 150 mg by mouth daily.   LORazepam (ATIVAN) 0.5 MG tablet Take 1 tablet (0.5 mg total) by mouth as needed.   polyethylene glycol (MIRALAX / GLYCOLAX) 17 g packet Take 17 g by mouth as needed.   rizatriptan (MAXALT) 10 MG tablet TAKE 1 TABLET BY MOUTH AS NEEDED FOR MIGRAINE. MAY REPEAT IN 2 HOURS IF NEEDED   aspirin EC 81 MG tablet Take 1 tablet by mouth daily.   losartan (COZAAR) 25 MG tablet Take 1 tablet (25 mg total) by mouth daily.   No facility-administered encounter medications on file as of 03/07/2022.    Allergies (verified) Levaquin [levofloxacin]   History: Past Medical History:  Diagnosis Date   Anxiety    BPH with urinary obstruction    s/p TURP   Chronic kidney disease    Depression    Stage 3a chronic kidney disease (CKD) (HCC)    secondary to lithium toxicity; baseline creatinine 1.3-1.6   Past Surgical History:  Procedure Laterality Date   HERNIA REPAIR     PROSTATE SURGERY     TRANSURETHRAL RESECTION OF PROSTATE     Family History  Problem Relation Age of Onset   Cancer Mother        breast cancer   Dementia Father    Arthritis Sister  hip OA s/p hip replacement   Social History   Socioeconomic History   Marital status: Married    Spouse name: Christian Parsons   Number of children: 1   Years of education: 16+   Highest education level: Professional school degree (e.g., MD, DDS, DVM, JD)  Occupational History   Occupation: retired    Comment: Music therapist  Tobacco Use   Smoking status: Never   Smokeless tobacco: Never  Vaping Use   Vaping Use: Never used  Substance and Sexual Activity   Alcohol use: Not Currently    Alcohol/week: 2.0 standard drinks of alcohol    Types: 2 Standard drinks or equivalent per week    Comment: Stop drinking 6-7 months ago   Drug use: No   Sexual activity: Yes    Partners: Female  Other Topics Concern   Not on  file  Social History Narrative   Lives with Wife   Right Handed   Drinks 2-3 cups caffeine daily   Social Determinants of Health   Financial Resource Strain: Low Risk  (03/07/2022)   Overall Financial Resource Strain (CARDIA)    Difficulty of Paying Living Expenses: Not hard at all  Food Insecurity: No Food Insecurity (03/07/2022)   Hunger Vital Sign    Worried About Running Out of Food in the Last Year: Never true    [employer] in the Last Year: Never true  Transportation Needs: No Transportation Needs (02/26/2021)   PRAPARE - Hydrologist (Medical): No    Lack of Transportation (Non-Medical): No  Physical Activity: Sufficiently Active (03/07/2022)   Exercise Vital Sign    Days of Exercise per Week: 5 days    Minutes of Exercise per Session: 150+ min  Stress: No Stress Concern Present (03/07/2022)   [place]    Feeling of Stress : Not at all  Social Connections: [place] (03/07/2022)   Social Connection and Isolation Panel [NHANES]    Frequency of Communication with Friends and Family: Three times a week    Frequency of Social Gatherings with Friends and Family: Three times a week    Attends Religious Services: 1 to 4 times per year    Active Member of Clubs or Organizations: Yes    Attends Archivist Meetings: 1 to 4 times per year    Marital Status: Married    Tobacco Counseling Counseling given: Not Answered   Clinical Intake:                 Diabetic?no          Activities of Daily Living     No data to display          Patient Care Team: Maximiano Coss, NP as PCP - General (Adult Health Nurse Practitioner) Melissa Noon, [place] as Referring Physician (Optometry) Adrian Prows, MD as Consulting Physician (Cardiology)  Indicate any recent Medical Services you may have received from other than [facility] providers in the past year (date may  be approximate).     Assessment:   This is a routine wellness examination for Christian Parsons.  Hearing/Vision screen Vision Screening - Comments:: Annual eye exams wear glasess  Dietary issues and exercise activities discussed:     Goals Addressed             This Visit's Progress    Exercise 3x per week (30 min per time)   On track  Patient states that he wants to get back to walking on a daily basis.        Depression Screen    03/07/2022    3:51 PM 03/07/2022    3:48 PM 11/09/2021    2:08 PM 06/18/2021   10:26 AM 02/26/2021   11:22 AM 12/12/2020    7:52 AM 09/26/2020    9:26 AM  PHQ 2/9 Scores  PHQ - 2 Score 0 0 0 0 0 0 0  PHQ- 9 Score   0 0       Fall Risk    03/07/2022    3:50 PM 11/09/2021    2:08 PM 06/18/2021   10:26 AM 02/26/2021   11:19 AM 12/12/2020    7:52 AM  [place] in the past year? 0 0 1 1 0  Number falls in past yr: 0 0 1 0 0  Comment    knee / went to physical therapy   Injury with Fall? 0 0 0 1 0  Risk for fall due to :  History of fall(s) History of fall(s)    Follow up Falls evaluation completed;Education provided Falls evaluation completed Falls evaluation completed Falls evaluation completed;Falls prevention discussed Falls evaluation completed    FALL RISK PREVENTION PERTAINING TO THE HOME:  Any stairs in or around the home? Yes  If so, are there any without handrails? No  Home free of loose throw rugs in walkways, pet beds, electrical cords, etc? Yes  Adequate lighting in your home to reduce risk of falls? Yes   ASSISTIVE DEVICES UTILIZED TO PREVENT FALLS:  Life alert? No  Use of a cane, walker or w/c? No  Grab bars in the bathroom? Yes  Shower chair or bench in shower? Yes  Elevated toilet seat or a handicapped toilet? Yes   Cognitive Function:    Normal cognitive status assessed by telephone conversation  by this Nurse Health Advisor. No abnormalities found.      08/05/2017    9:30 AM  6CIT Screen  What Year? 0 points   What month? 0 points  What time? 0 points  Count back from 20 0 points  Months in reverse 0 points  Repeat phrase 0 points  Total Score 0 points    Immunizations Immunization History  Administered Date(s) Administered   Hep A / Hep B 01/30/2004, 03/01/2004   Influenza Split 07/24/2012   Influenza, High Dose Seasonal PF 06/16/2013, 05/25/2014, 06/18/2015, 07/30/2016, 06/03/2017, 05/20/2018, 05/07/2019   Influenza,inj,Quad PF,6+ Mos 06/16/2013, 05/25/2014, 06/18/2015, 07/30/2016, 06/03/2017   Influenza-Unspecified 01/30/2004, 05/20/2018, 07/05/2020   PFIZER(Purple Top)SARS-COV-2 Vaccination 10/23/2019, 11/17/2019, 05/19/2020   Pneumococcal Conjugate-13 07/30/2016   Pneumococcal Polysaccharide-23 11/07/2010, 08/05/2017   Td 01/30/2004   Tdap 06/16/2013   Typhoid Inactivated 01/30/2004    TDAP status: Up to date  Flu Vaccine status: Up to date  Pneumococcal vaccine status: Up to date  Covid-19 vaccine status: Completed vaccines  Qualifies for Shingles Vaccine? Yes   Zostavax completed Yes   Shingrix Completed?: Yes  Screening Tests Health Maintenance  Topic Date Due   Zoster Vaccines- Shingrix (1 of 2) Never done   COVID-19 Vaccine (4 - Pfizer series) 07/14/2020   INFLUENZA VACCINE  04/16/2022   TETANUS/TDAP  06/17/2023   COLONOSCOPY (Pts 45-33yr Insurance coverage will need to be confirmed)  02/15/2024   Pneumonia Vaccine 76 Years old  Completed   Hepatitis C Screening  Completed   HPV VACCINES  Aged Out    Health Maintenance  Health Maintenance Due  Topic Date Due   Zoster Vaccines- Shingrix (1 of 2) Never done   COVID-19 Vaccine (4 - Pfizer series) 07/14/2020    Colorectal cancer screening: Type of screening: Colonoscopy. Completed 11/2021 per patient [place] Parsons . Repeat every 10 years  Lung Cancer Screening: (Low Dose CT Chest recommended if Age 6-80 years, 30 pack-year currently smoking OR have quit w/in 15years.) does not qualify.   Lung Cancer  Screening Referral: n/a  Additional Screening:  Hepatitis C Screening: does not qualify;  Vision Screening: Recommended annual ophthalmology exams for early detection of glaucoma and other disorders of the eye. Is the patient up to date with their annual eye exam?  Yes  Who is the provider or what is the name of the office in which the patient attends annual eye exams? Dr.Lyles  If pt is not established with a provider, would they like to be referred to a provider to establish care? No .   Dental Screening: Recommended annual dental exams for proper oral hygiene  Community Resource Referral / Chronic Care Management: CRR required this visit?  No   CCM required this visit?  No      Plan:     I have personally reviewed and noted the following in the patient's chart:   Medical and social history Use of alcohol, tobacco or illicit drugs  Current medications and supplements including opioid prescriptions. Patient is not currently taking opioid prescriptions. Functional ability and status Nutritional status Physical activity Advanced directives List of other physicians Hospitalizations, surgeries, and ER visits in previous 12 months Vitals Screenings to include cognitive, depression, and falls Referrals and appointments  In addition, I have reviewed and discussed with patient certain preventive protocols, quality metrics, and best practice recommendations. A written personalized care plan for preventive services as well as general preventive health recommendations were provided to patient.     Randel Pigg, LPN   7/67/2094   Nurse Notes: none

## 2022-04-17 ENCOUNTER — Other Ambulatory Visit: Payer: Self-pay

## 2022-04-17 ENCOUNTER — Ambulatory Visit (INDEPENDENT_AMBULATORY_CARE_PROVIDER_SITE_OTHER): Payer: Medicare Other | Admitting: Registered Nurse

## 2022-04-17 ENCOUNTER — Encounter: Payer: Self-pay | Admitting: Registered Nurse

## 2022-04-17 VITALS — BP 122/84 | HR 63 | Temp 98.1°F | Resp 17 | Ht 72.0 in | Wt 225.8 lb

## 2022-04-17 DIAGNOSIS — Z8639 Personal history of other endocrine, nutritional and metabolic disease: Secondary | ICD-10-CM

## 2022-04-17 DIAGNOSIS — I719 Aortic aneurysm of unspecified site, without rupture: Secondary | ICD-10-CM | POA: Diagnosis not present

## 2022-04-17 DIAGNOSIS — Z0000 Encounter for general adult medical examination without abnormal findings: Secondary | ICD-10-CM | POA: Diagnosis not present

## 2022-04-17 DIAGNOSIS — N182 Chronic kidney disease, stage 2 (mild): Secondary | ICD-10-CM

## 2022-04-17 LAB — URINALYSIS, ROUTINE W REFLEX MICROSCOPIC
Bilirubin Urine: NEGATIVE
Hgb urine dipstick: NEGATIVE
Ketones, ur: NEGATIVE
Leukocytes,Ua: NEGATIVE
Nitrite: NEGATIVE
RBC / HPF: NONE SEEN (ref 0–?)
Specific Gravity, Urine: <=1.005 — AB (ref 1.000–1.030)
Total Protein, Urine: NEGATIVE
Urine Glucose: NEGATIVE
Urobilinogen, UA: 0.2 (ref 0.0–1.0)
WBC, UA: NONE SEEN (ref 0–?)
pH: 6 (ref 5.0–8.0)

## 2022-04-17 LAB — CBC WITH DIFFERENTIAL/PLATELET
Basophils Absolute: 0 10*3/uL (ref 0.0–0.1)
Basophils Relative: 0.7 % (ref 0.0–3.0)
Eosinophils Absolute: 0.1 10*3/uL (ref 0.0–0.7)
Eosinophils Relative: 2.3 % (ref 0.0–5.0)
HCT: 46.5 % (ref 39.0–52.0)
Hemoglobin: 15.6 g/dL (ref 13.0–17.0)
Lymphocytes Relative: 21.6 % (ref 12.0–46.0)
Lymphs Abs: 1 10*3/uL (ref 0.7–4.0)
MCHC: 33.4 g/dL (ref 30.0–36.0)
MCV: 95.1 fl (ref 78.0–100.0)
Monocytes Absolute: 0.4 10*3/uL (ref 0.1–1.0)
Monocytes Relative: 9.2 % (ref 3.0–12.0)
Neutro Abs: 3 10*3/uL (ref 1.4–7.7)
Neutrophils Relative %: 66.2 % (ref 43.0–77.0)
Platelets: 143 10*3/uL — ABNORMAL LOW (ref 150.0–400.0)
RBC: 4.89 Mil/uL (ref 4.22–5.81)
RDW: 13.2 % (ref 11.5–15.5)
WBC: 4.5 10*3/uL (ref 4.0–10.5)

## 2022-04-17 LAB — COMPREHENSIVE METABOLIC PANEL
ALT: 25 U/L (ref 0–53)
AST: 24 U/L (ref 0–37)
Albumin: 4.5 g/dL (ref 3.5–5.2)
Alkaline Phosphatase: 72 U/L (ref 39–117)
BUN: 24 mg/dL — ABNORMAL HIGH (ref 6–23)
CO2: 28 mEq/L (ref 19–32)
Calcium: 9.6 mg/dL (ref 8.4–10.5)
Chloride: 106 mEq/L (ref 96–112)
Creatinine, Ser: 1.71 mg/dL — ABNORMAL HIGH (ref 0.40–1.50)
GFR: 39.07 mL/min — ABNORMAL LOW (ref >60.00)
Glucose, Bld: 103 mg/dL — ABNORMAL HIGH (ref 70–99)
Potassium: 4.4 mEq/L (ref 3.5–5.1)
Sodium: 142 mEq/L (ref 135–145)
Total Bilirubin: 0.7 mg/dL (ref 0.2–1.2)
Total Protein: 7.4 g/dL (ref 6.0–8.3)

## 2022-04-17 LAB — LIPID PANEL
Cholesterol: 140 mg/dL (ref 0–200)
HDL: 34.8 mg/dL — ABNORMAL LOW (ref >39.00)
LDL Cholesterol: 86 mg/dL (ref 0–99)
NonHDL: 105.33
Total CHOL/HDL Ratio: 4
Triglycerides: 96 mg/dL (ref 0.0–149.0)
VLDL: 19.2 mg/dL (ref 0.0–40.0)

## 2022-04-17 LAB — HEMOGLOBIN A1C: Hgb A1c MFr Bld: 5.8 % (ref 4.6–6.5)

## 2022-04-17 NOTE — Progress Notes (Signed)
Complete physical exam  Patient: Christian Parsons   DOB: 1946-03-13   76 y.o. Male  MRN: [id number] Visit Date: 04/17/2022  Subjective:    Chief Complaint  Patient presents with   Annual Exam    Patient states he is here for CPE    Christian Parsons is a 76 y.o. male who presents today for a complete physical exam. He reports consuming a general diet. Home exercise routine includes hiking multiple times weekly. He generally feels well. He reports sleeping well. He does not have additional problems to discuss today.   Vision:Within the last year Dental:Within Last 6 months STD Screen:No PSA:No  Most recent fall risk assessment:    04/17/2022    8:10 AM  [place] in the past year? 0  Number falls in past yr: 0  Injury with Fall? 0  Risk for fall due to : No Fall Risks  Follow up Falls evaluation completed     Most recent depression screenings:    04/17/2022    8:10 AM 03/07/2022    3:51 PM  PHQ 2/9 Scores  PHQ - 2 Score 0 0  PHQ- 9 Score 3      Patient Active Problem List   Diagnosis Date Noted   Aortic aneurysm without rupture ([place]) 06/14/2021   Abdominal aortic aneurysm (AAA) without rupture ([place]) 06/14/2021   Atherosclerosis of aorta ([place]) 11/18/2017   Left sided sciatica 11/18/2017   Pes planus 03/13/2017   Knee pain 03/11/2017   BPH with urinary obstruction 08/12/2016   CKD (chronic kidney disease) stage 2 or early 3 07/11/2015   Ventral hernia without obstruction or gangrene 02/17/2015   Diastasis recti 12/22/2014   Erectile dysfunction 12/22/2014   Benign essential tremor 06/16/2013   Migraine headache with aura 12/25/2011   BMI 31.0-31.9,adult 12/25/2011   Right bundle branch block 12/25/2011   Mood disorder ([place])    Past Medical History:  Diagnosis Date   Anxiety    BPH with urinary obstruction    s/p TURP   Chronic kidney disease    Depression    Stage 3a chronic kidney disease (CKD) ([place])    secondary to lithium toxicity; baseline creatinine  1.3-1.6   Past Surgical History:  Procedure Laterality Date   HERNIA REPAIR     PROSTATE SURGERY     TRANSURETHRAL RESECTION OF PROSTATE     Social History   Tobacco Use   Smoking status: Never   Smokeless tobacco: Never  Vaping Use   Vaping Use: Never used  Substance Use Topics   Alcohol use: Not Currently    Alcohol/week: 2.0 standard drinks of alcohol    Types: 2 Standard drinks or equivalent per week    Comment: Stop drinking 6-7 months ago   Drug use: No   Social History   Socioeconomic History   Marital status: Married    Spouse name: Christian Parsons   Number of children: 1   Years of education: 16+   Highest education level: Professional school degree (e.g., MD, DDS, DVM, JD)  Occupational History   Occupation: retired    Comment: Music therapist  Tobacco Use   Smoking status: Never   Smokeless tobacco: Never  Vaping Use   Vaping Use: Never used  Substance and Sexual Activity   Alcohol use: Not Currently    Alcohol/week: 2.0 standard drinks of alcohol    Types: 2 Standard drinks or equivalent per week    Comment: Stop drinking 6-7 months  ago   Drug use: No   Sexual activity: Yes    Partners: Female  Other Topics Concern   Not on file  Social History Narrative   Lives with Wife   Right Handed   Drinks 2-3 cups caffeine daily   Social Determinants of Health   Financial Resource Strain: Low Risk  (03/07/2022)   Overall Financial Resource Strain (CARDIA)    Difficulty of Paying Living Expenses: Not hard at all  Food Insecurity: No Food Insecurity (03/07/2022)   Hunger Vital Sign    Worried About Running Out of Food in the Last Year: Never true    Ran Out of Food in the Last Year: Never true  Transportation Needs: No Transportation Needs (02/26/2021)   PRAPARE - Hydrologist (Medical): No    Lack of Transportation (Non-Medical): No  Physical Activity: Sufficiently Active (03/07/2022)   Exercise Vital Sign    Days of Exercise per  Week: 5 days    Minutes of Exercise per Session: 150+ min  Stress: No Stress Concern Present (03/07/2022)   [place]    Feeling of Stress : Not at all  Social Connections: [place] (03/07/2022)   Social Connection and Isolation Panel [NHANES]    Frequency of Communication with Friends and Family: Three times a week    Frequency of Social Gatherings with Friends and Family: Three times a week    Attends Religious Services: 1 to 4 times per year    Active Member of Clubs or Organizations: Yes    Attends Archivist Meetings: 1 to 4 times per year    Marital Status: Married  Human resources officer Violence: Not At Risk (03/07/2022)   Humiliation, Afraid, Rape, and Kick questionnaire    Fear of Current or Ex-Partner: No    Emotionally Abused: No    Physically Abused: No    Sexually Abused: No   Family Status  Relation Name Status   Mother  Deceased at age 54       Breast cancer   Father  Deceased at age 13       bowel obstruction,palpitations; dementia   Sister  Alive   Sister  Alive   MGM  Deceased at age 62   MGF  Deceased at age 83   [place]  Deceased   PGF  Deceased   Daughter  Alive   Family History  Problem Relation Age of Onset   Cancer Mother        breast cancer   Dementia Father    Arthritis Sister        hip OA s/p hip replacement   Allergies  Allergen Reactions   Levaquin [Levofloxacin] Other (See Comments)    Avoid fluoroquinolones if possible due to AAA.     Patient Care Team: Christian Coss, NP as PCP - General (Adult Health Nurse Practitioner) Christian Parsons, [place] as Referring Physician (Optometry) Christian Prows, MD as Consulting Physician (Cardiology)   Medications: Outpatient Medications Prior to Visit  Medication Sig   aspirin EC 81 MG tablet Take 81 mg by mouth daily.   aspirin EC 81 MG tablet Take 1 tablet by mouth daily.   lamoTRIgine (LAMICTAL) 150 MG tablet Take 150 mg  by mouth daily.   LORazepam (ATIVAN) 0.5 MG tablet Take 1 tablet (0.5 mg total) by mouth as needed.   polyethylene glycol (MIRALAX / GLYCOLAX) 17 g packet Take 17 g by mouth as  needed.   rizatriptan (MAXALT) 10 MG tablet TAKE 1 TABLET BY MOUTH AS NEEDED FOR MIGRAINE. MAY REPEAT IN 2 HOURS IF NEEDED   losartan (COZAAR) 25 MG tablet Take 1 tablet (25 mg total) by mouth daily.   No facility-administered medications prior to visit.    Review of Systems  Constitutional: Negative.   HENT: Negative.    Eyes: Negative.   Respiratory: Negative.    Cardiovascular: Negative.   Gastrointestinal: Negative.   Genitourinary: Negative.   Musculoskeletal: Negative.   Skin: Negative.   Neurological: Negative.   Psychiatric/Behavioral: Negative.    All other systems reviewed and are negative.   Last CBC Lab Results  Component Value Date   WBC 4.5 12/12/2020   HGB 15.3 12/12/2020   HCT 44.1 12/12/2020   MCV 94.3 12/12/2020   MCH 32.3 03/27/2020   RDW 13.0 12/12/2020   PLT 141.0 (L) 03/11/9484   Last metabolic panel Lab Results  Component Value Date   GLUCOSE 83 08/27/2021   NA 140 08/27/2021   K 4.5 08/27/2021   CL 102 08/27/2021   CO2 23 08/27/2021   BUN 20 08/27/2021   CREATININE 1.61 (H) 08/27/2021   EGFR 45 (L) 08/27/2021   CALCIUM 8.2 (L) 08/27/2021   PROT 7.3 12/12/2020   ALBUMIN 4.4 12/12/2020   LABGLOB 2.7 09/28/2019   AGRATIO 1.5 09/28/2019   BILITOT 0.6 12/12/2020   ALKPHOS 73 12/12/2020   AST 19 12/12/2020   ALT 21 12/12/2020   Last lipids Lab Results  Component Value Date   CHOL 148 08/27/2021   HDL 38 (L) 08/27/2021   LDLCALC 92 08/27/2021   TRIG 96 08/27/2021   CHOLHDL 4 12/12/2020   Last hemoglobin A1c Lab Results  Component Value Date   HGBA1C 5.6 12/12/2020   Last thyroid functions Lab Results  Component Value Date   TSH 1.006 08/25/2015   Last vitamin D No results found for: "25OHVITD2", "25OHVITD3", "VD25OH" Last vitamin B12 and Folate No  results found for: "VITAMINB12", "FOLATE"      Objective:     BP 122/84   Pulse 63   Temp 98.1 F (36.7 C) (Temporal)   Resp 17   Ht 6' (1.829 m)   Wt 225 lb 12.8 oz (102.4 kg)   SpO2 97%   BMI 30.62 kg/m   BP Readings from Last 3 Encounters:  04/17/22 122/84  11/28/21 122/73  11/09/21 117/70   Wt Readings from Last 3 Encounters:  04/17/22 225 lb 12.8 oz (102.4 kg)  11/28/21 227 lb 6.4 oz (103.1 kg)  11/09/21 223 lb 3.2 oz (101.2 kg)   SpO2 Readings from Last 3 Encounters:  04/17/22 97%  11/28/21 99%  11/09/21 98%      Physical Exam Vitals and nursing note reviewed.  Constitutional:      General: He is not in acute distress.    Appearance: Normal appearance. He is not ill-appearing, toxic-appearing or diaphoretic.  HENT:     Head: Normocephalic and atraumatic.     Right Ear: Tympanic membrane, ear canal and external ear normal. There is no impacted cerumen.     Left Ear: Tympanic membrane, ear canal and external ear normal. There is no impacted cerumen.     Nose: Nose normal. No congestion or rhinorrhea.     Mouth/Throat:     Mouth: Mucous membranes are moist.     Pharynx: Oropharynx is clear. No oropharyngeal exudate or posterior oropharyngeal erythema.  Eyes:     General: No scleral  icterus.       Right eye: No discharge.        Left eye: No discharge.     Extraocular Movements: Extraocular movements intact.     Conjunctiva/sclera: Conjunctivae normal.     Pupils: Pupils are equal, round, and reactive to light.  Neck:     Vascular: No carotid bruit.  Cardiovascular:     Rate and Rhythm: Normal rate and regular rhythm.     Pulses: Normal pulses.     Heart sounds: Normal heart sounds. No murmur heard.    No friction rub. No gallop.  Pulmonary:     Effort: Pulmonary effort is normal. No respiratory distress.     Breath sounds: Normal breath sounds. No stridor. No wheezing, rhonchi or rales.  Chest:     Chest wall: No tenderness.  Abdominal:      General: Abdomen is flat. Bowel sounds are normal. There is no distension.     Palpations: Abdomen is soft. There is no mass.     Tenderness: There is no abdominal tenderness. There is no right CVA tenderness, left CVA tenderness, guarding or rebound.     Hernia: No hernia is present.  Musculoskeletal:        General: No swelling, tenderness, deformity or signs of injury. Normal range of motion.     Cervical back: Normal range of motion and neck supple. No rigidity or tenderness.     Right lower leg: No edema.     Left lower leg: No edema.  Lymphadenopathy:     Cervical: No cervical adenopathy.  Skin:    General: Skin is warm and dry.     Capillary Refill: Capillary refill takes less than 2 seconds.     Coloration: Skin is not jaundiced or pale.     Findings: No bruising, erythema, lesion or rash.  Neurological:     General: No focal deficit present.     Mental Status: He is alert and oriented to person, place, and time. Mental status is at baseline.     Cranial Nerves: No cranial nerve deficit.     Sensory: No sensory deficit.     Motor: No weakness.     Coordination: Coordination normal.     Gait: Gait normal.     Deep Tendon Reflexes: Reflexes normal.  Psychiatric:        Mood and Affect: Mood normal.        Behavior: Behavior normal.        Thought Content: Thought content normal.        Judgment: Judgment normal.      No results found for any visits on 04/17/22.      Routine Health Maintenance and Physical Exam  Immunization History  Administered Date(s) Administered   Hep A / Hep B 01/30/2004, 03/01/2004   Influenza Split 07/24/2012   Influenza, High Dose Seasonal PF 06/16/2013, 05/25/2014, 06/18/2015, 07/30/2016, 06/03/2017, 05/20/2018, 05/07/2019   Influenza,inj,Quad PF,6+ Mos 06/16/2013, 05/25/2014, 06/18/2015, 07/30/2016, 06/03/2017   Influenza-Unspecified 01/30/2004, 05/20/2018, 07/05/2020   PFIZER(Purple Top)SARS-COV-2 Vaccination 10/23/2019, 11/17/2019,  05/19/2020   Pneumococcal Conjugate-13 07/30/2016   Pneumococcal Polysaccharide-23 11/07/2010, 08/05/2017   Td 01/30/2004   Tdap 06/16/2013   Typhoid Inactivated 01/30/2004    Health Maintenance  Topic Date Due   INFLUENZA VACCINE  04/16/2022   COVID-19 Vaccine (4 - Pfizer series) 05/03/2022 (Originally 07/14/2020)   Zoster Vaccines- Shingrix (1 of 2) 07/18/2022 (Originally 03/20/1998)   TETANUS/TDAP  06/17/2023   COLONOSCOPY (Pts 45-69yr Insurance coverage  will need to be confirmed)  02/15/2024   Pneumonia Vaccine 39+ Years old  Completed   Hepatitis C Screening  Completed   HPV VACCINES  Aged Out    Discussed health benefits of physical activity, and encouraged him to engage in regular exercise appropriate for his age and condition.  Problem List Items Addressed This Visit       Cardiovascular and Mediastinum   Aortic aneurysm without rupture ([place])   Relevant Orders   CBC with Differential/Platelet   Comprehensive metabolic panel   Lipid panel   Urinalysis, Routine w reflex microscopic     Genitourinary   CKD (chronic kidney disease) stage 2 or early 3   Relevant Orders   CBC with Differential/Platelet   Comprehensive metabolic panel   Lipid panel   Urinalysis, Routine w reflex microscopic   Other Visit Diagnoses     Annual physical exam    -  Primary   History of elevated glucose       Relevant Orders   Hemoglobin A1c      Return in about 6 months (around 10/18/2022) for Chronic Conditions.     PLAN Exam unremarkable Labs collected. Will follow up with the patient as warranted. Patient encouraged to call clinic with any questions, comments, or concerns.   Christian Coss, NP

## 2022-04-17 NOTE — Patient Instructions (Addendum)
Mr. Christian Parsons -   Christian Parsons to see you  No concerns on exam. I will let you know how labs look  I would recommend these providers going forward:  Christian Pap, MD Christian Parsons, [place] Christian Chyle, MD Christian Caroli, MD Christian Blazer Early, NP Christian Ruths, DNP Christian Mitts, MD   Thank you for letting me take part in your care,  Christian Parsons   If you have lab work done today you will be contacted with your lab results within the next 2 weeks.  If you have not heard from [place] then please contact us. The fastest way to get your results is to register for My Chart.   IF you received an x-ray today, you will receive an invoice from [facility]. Please contact [facility] at [phone] with questions or concerns regarding your invoice.   IF you received labwork today, you will receive an invoice from [place]. Please contact [facility] at [phone] with questions or concerns regarding your invoice.   Our billing staff will not be able to assist you with questions regarding bills from these companies.  You will be contacted with the lab results as soon as they are available. The fastest way to get your results is to activate your My Chart account. Instructions are located on the last page of this paperwork. If you have not heard from [place] regarding the results in 2 weeks, please contact this office.

## 2022-05-27 ENCOUNTER — Other Ambulatory Visit: Payer: Self-pay | Admitting: Registered Nurse

## 2022-05-27 DIAGNOSIS — G43109 Migraine with aura, not intractable, without status migrainosus: Secondary | ICD-10-CM

## 2022-05-27 MED ORDER — RIZATRIPTAN BENZOATE 10 MG PO TABS: ORAL_TABLET | ORAL | 2 refills | Status: DC

## 2022-05-27 NOTE — Telephone Encounter (Signed)
Recent CPE and meds discussed in March. Refilled maxalt

## 2022-05-27 NOTE — Telephone Encounter (Signed)
Encourage patient to contact the pharmacy for refills or they can request refills through [facility]  (Please schedule appointment if patient has not been seen in over a year) Last appt was 04/17/22   WHAT PHARMACY WOULD THEY LIKE THIS SENT TO: [id number] - [place], [place] NAME & DOSE: Rizatriptan 10 mg   NOTES/COMMENTS FROM PATIENT: pt calling for a refill on medication.      [place] office please notify patient: It takes 48-72 hours to process rx refill requests Ask patient to call pharmacy to ensure rx is ready before heading there.

## 2022-05-27 NOTE — Telephone Encounter (Signed)
Patient is requesting a refill of the following medications: Requested Prescriptions   Pending Prescriptions Disp Refills   rizatriptan (MAXALT) 10 MG tablet 10 tablet 2    Sig: May repeat in 2 hours if needed    Date of patient request: 05/27/22 Last office visit: 12/25/21 Date of last refill: 05/08/22 Last refill amount: 10

## 2022-07-26 ENCOUNTER — Telehealth: Payer: Self-pay | Admitting: Cardiology

## 2022-07-26 NOTE — Telephone Encounter (Signed)
Patient recently had labs drawn in August. He does not need anymore before his appointment.

## 2022-07-26 NOTE — Telephone Encounter (Signed)
Can you look into this?

## 2022-07-26 NOTE — Telephone Encounter (Signed)
Patient wants to know if there are labs he needs to have done, he wants to complete and follow up w/you in office after his echo on 07/31/22.

## 2022-07-31 ENCOUNTER — Ambulatory Visit: Payer: Medicare Other

## 2022-07-31 DIAGNOSIS — I7121 Aneurysm of the ascending aorta, without rupture: Secondary | ICD-10-CM

## 2022-08-01 NOTE — Telephone Encounter (Signed)
Has the patient been notified?

## 2022-08-07 NOTE — Telephone Encounter (Signed)
Yes was contact last week. Patient called and is asking for his echocardiogram results.

## 2022-08-19 ENCOUNTER — Telehealth: Payer: Self-pay

## 2022-08-19 NOTE — Telephone Encounter (Signed)
I have sent a MyChart message.  Please let him know that his LV systolic function is normal.  He has moderate LVH LVH = Left ventricular hypertrophy: thickening of heart muscle probably due to hypertension. Can happen in valve leaking issues, obesity, diabetes.  Sometimes atheletes or if you exercise daily then it is not abnormal.  This has remained stable.  Ascending aortic aneurysm measures between 4-4.5 cm and this is also remained stable from prior echocardiogram.

## 2022-08-19 NOTE — Telephone Encounter (Signed)
Patient calling for Echo results

## 2022-09-04 NOTE — Telephone Encounter (Signed)
Can you please echocardiogram results? I do not believe this was followed up on. Please advise.

## 2022-09-05 NOTE — Telephone Encounter (Signed)
Normal heart function. Aortic  aneurysm is stable at 4.5 cm (dilated aorta) and no change from previous

## 2022-09-16 HISTORY — PX: CYSTOSCOPY: SUR368

## 2022-12-02 ENCOUNTER — Ambulatory Visit: Payer: Medicare Other | Admitting: Student

## 2022-12-02 ENCOUNTER — Encounter: Payer: Self-pay | Admitting: Cardiology

## 2022-12-02 ENCOUNTER — Ambulatory Visit: Payer: Medicare Other | Admitting: Cardiology

## 2022-12-02 VITALS — BP 130/90 | HR 81 | Resp 16 | Ht 72.0 in | Wt 227.6 lb

## 2022-12-02 DIAGNOSIS — N1831 Chronic kidney disease, stage 3a: Secondary | ICD-10-CM

## 2022-12-02 DIAGNOSIS — I7121 Aneurysm of the ascending aorta, without rupture: Secondary | ICD-10-CM

## 2022-12-02 DIAGNOSIS — I700 Atherosclerosis of aorta: Secondary | ICD-10-CM

## 2022-12-02 DIAGNOSIS — R030 Elevated blood-pressure reading, without diagnosis of hypertension: Secondary | ICD-10-CM

## 2022-12-02 NOTE — Progress Notes (Unsigned)
Primary Physician/Referring:  Wayland Salinas, MD  Patient ID: Christian Parsons, male    DOB: 12/01/1945, 77 y.o.   MRN: [id number]  Chief Complaint  Patient presents with   Aneurysm of ascending aorta without rupture   Hypertension   Follow-up    1 year   HPI:    Christian Parsons  is a 77 y.o. 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  He has no history of hypertension, hyperlipidemia or tobacco use disorder or family history of aortic aneurysm.  Patient presents for annual follow-up.  Denies chest pain, abdominal pain, dyspnea.  Past Medical History:  Diagnosis Date   Anxiety    BPH with urinary obstruction    s/p TURP   Chronic kidney disease    Depression    Stage 3a chronic kidney disease (CKD) (HCC)    secondary to lithium toxicity; baseline creatinine 1.3-1.6   Past Surgical History:  Procedure Laterality Date   HERNIA REPAIR     PROSTATE SURGERY     TRANSURETHRAL RESECTION OF PROSTATE     Family History  Problem Relation Age of Onset   Cancer Mother        breast cancer   Dementia Father    Arthritis Sister        hip OA s/p hip replacement    Social History   Tobacco Use   Smoking status: Never   Smokeless tobacco: Never  Substance Use Topics   Alcohol use: Not Currently    Alcohol/week: 2.0 standard drinks of alcohol    Types: 2 Standard drinks or equivalent per week    Comment: Stop drinking 6-7 months ago   Marital Status: Married  ROS  Review of Systems  Cardiovascular:  Negative for chest pain, claudication, dyspnea on exertion, leg swelling, near-syncope, orthopnea, palpitations, paroxysmal nocturnal dyspnea and syncope.  Neurological:  Negative for dizziness.   Objective  Blood  pressure (!) 130/90, pulse 81, resp. rate 16, height 6' (1.829 m), weight 227 lb 9.6 oz (103.2 kg), SpO2 97 %. Body mass index is 30.87 kg/m.     12/02/2022    1:27 PM 12/02/2022    1:26 PM 04/17/2022    8:10 AM  Vitals with BMI  Height  6\' 0"  6\' 0"   Weight  227 lbs 10 oz 225 lbs 13 oz  BMI  [id number]  Systolic [id number]  Diastolic 90 91 84  Pulse 81 80 63     Physical Exam Neck:     Vascular: No carotid bruit or JVD.  Cardiovascular:     Rate and Rhythm: Normal rate and regular rhythm.     Pulses: Intact distal pulses.     Heart sounds: Normal heart sounds. No murmur heard.    No gallop.  Pulmonary:     Effort: Pulmonary effort is normal.     Breath sounds: Normal breath sounds.  Abdominal:     General: Bowel sounds are normal.     Palpations: Abdomen is soft.     Hernia: A hernia is present. Hernia is present in the ventral area.  Musculoskeletal:     Right lower leg: No edema.     Left lower leg: No edema.  Laboratory examination:    Lab Results  Component Value Date   NA 142 04/17/2022   K 4.4 04/17/2022   CO2 28 04/17/2022   GLUCOSE 103 (H) 04/17/2022   BUN 24 (H) 04/17/2022   CREATININE 1.71 (H) 04/17/2022   CALCIUM 9.6 04/17/2022   EGFR 45 (L) 08/27/2021   GFRNONAA 45 (L) 09/28/2019       Latest Ref Rng & Units 04/17/2022    8:55 AM 08/27/2021    4:32 PM 07/16/2021   12:49 PM  CMP  Glucose 70 - 99 mg/dL 103  83  99   BUN 6 - 23 mg/dL 24  20  23    Creatinine 0.40 - 1.50 mg/dL 1.71  1.61  1.63   Sodium 135 - 145 mEq/L 142  140  143   Potassium 3.5 - 5.1 mEq/L 4.4  4.5  4.5   Chloride 96 - 112 mEq/L 106  102  103   CO2 19 - 32 mEq/L 28  23  25    Calcium 8.4 - 10.5 mg/dL 9.6  8.2  9.7   Total Protein 6.0 - 8.3 g/dL 7.4     Total Bilirubin 0.2 - 1.2 mg/dL 0.7     Alkaline Phos 39 - 117 U/L 72     AST 0 - 37 U/L 24     ALT 0 - 53 U/L 25         Latest Ref Rng & Units 04/17/2022    8:55 AM 12/12/2020    8:49 AM 03/27/2020    2:51 PM  CBC  WBC  4.0 - 10.5 K/uL 4.5  4.5  5.1   Hemoglobin 13.0 - 17.0 g/dL 15.6  15.3  14.1   Hematocrit 39.0 - 52.0 % 46.5  44.1  40.6   Platelets 150.0 - 400.0 K/uL 143.0  141.0      Lipid Panel Recent Labs    04/17/22 0855  CHOL 140  TRIG 96.0  LDLCALC 86  VLDL 19.2  HDL 34.80*  CHOLHDL 4   HEMOGLOBIN A1C Lab Results  Component Value Date   HGBA1C 5.8 04/17/2022   Lab Results  Component Value Date   TSH 1.006 08/25/2015    Allergies   Allergies  Allergen Reactions   Levaquin [Levofloxacin] Other (See Comments)    Avoid fluoroquinolones if possible due to AAA.     Medication list after today's encounter   Current Outpatient Medications  Medication Instructions   aspirin EC 81 mg, Oral, Daily   lamoTRIgine (LAMICTAL) 150 mg, Oral, Daily   LORazepam (ATIVAN) 0.5 mg, Oral, As needed   rizatriptan (MAXALT) 10 MG tablet May repeat in 2 hours if needed   Radiology:   CT of the abdomen 10/20/2018: No evidence of AAA. Vascular/Lymphatic: Atherosclerotic calcifications of aorta and iliac arteries. Aorta normal caliber. No adenopathy.  MR angio of the chest 07/30/2019: 1. Mild fusiform aneurysmal dilatation of the ascending thoracic aorta measuring 44 mm in maximal diameter. Recommend annual imaging followup by CTA or MRA. 2. Borderline cardiomegaly. 3. Previously characterized benign hepatic hemangioma is morphologically unchanged compared to remote abdominal CT 04/2014.  Cardiac Studies:   Exercise nuclear stress test 06/23/2019: Patient exercised on Bruce protocol for 9 minutes and achieved 10.1 METS. Normal nuclear perfusion, LVEF 57%, low risk.  PCV ECHOCARDIOGRAM COMPLETE 07/31/2022  Narrative Echocardiogram 07/31/2022: Left ventricle cavity is normal in size. Moderate concentric hypertrophy of the left ventricle. Normal global wall motion. Normal LV systolic function with EF 54%. Doppler evidence  of grade I (impaired) diastolic dysfunction, normal LAP. Structurally  normal trileaflet aortic valve.  Moderate (Grade II) aortic regurgitation. The aortic root is dilated, measuring 4.3 cm at sinus of Valsalva.  Ascending aortic aneurysm, measuring 4.5 cm, aortic arch measuring 3.6 cm. Compared to previous study on 07/19/2021 & 02/20/3709, grade 1 diastolic dysfunction is new. No other significant change noted.  EKG:   EKG 12/02/2022: Sinus rhythm with first-degree AV block at rate of 73 bpm, left middle enlargement, right bundle branch block.  Compared to 07/16/2021, no significant change.   Assessment     ICD-10-CM   1. Aneurysm of ascending aorta without rupture (HCC)  I71.21 EKG 12-Lead    2. Aortic atherosclerosis (HCC)  I70.0     3. Stage 3a chronic kidney disease (HCC)  N18.31     4. Elevated blood pressure reading in office without diagnosis of hypertension  R03.0        Medications Discontinued During This Encounter  Medication Reason   aspirin EC 81 MG tablet    polyethylene glycol (MIRALAX / GLYCOLAX) 17 g packet      No orders of the defined types were placed in this encounter.   Orders Placed This Encounter  Procedures   EKG 12-Lead   Recommendations:   Christian Parsons is a 77 y.o.  Caucasian male patient with known ascending aortic aneurysm at 4.4 to 4.5 cm diagnosed in 2020, stage IIIa chronic kidney disease secondary to lithium toxicity, who is very active and walks at least 30 miles a week and also hikes, no history of hypertension, hyperlipidemia or tobacco use disorder or family history of aortic aneurysm.  1. Aneurysm of ascending aorta without rupture [facility]) Patient has moderate-sized ascending aortic aneurysm measuring around 4.4 to 4.5 cm by MRI of the chest, there is been a good correlation between radiologic scanning and echocardiogram hence we will continue annual echocardiogram and consider repeating CT scan probably next year.  I would like to start him on an ACE inhibitor or an ARB specifically losartan in view of aortic  aneurysm.  He does have underlying stage IIIa chronic kidney disease and this has to be monitored closely.  His blood pressure is markedly elevated upon presentation and remained high throughout examination as well.  Concerning stuff is that the diastolic blood pressure was high.  He will continue to monitor his blood pressure at home, this morning his blood pressure was 120/70 mmHg.  He will bring his blood pressure cuff to his next office visit for calibration as well.  He has an appointment to see his PCP in 2 weeks, he is updating labs and he will bring those labs to me as well.  Although beta-blocker would be appropriate in view of ascending aortic aneurysm, in view of his age and also underlying first-degree AV block and right bundle branch block giving a bifascicular block, I do not want to use a beta-blocker.  2. Aortic atherosclerosis (HCC) His lipids are normal.  He does have aortic atherosclerosis but this is probably age-appropriate.  3. Stage 3a chronic kidney disease (HCC) Chronic kidney disease has remained stable.  He has changed his PCP.  4. Elevated blood pressure reading in office without diagnosis of hypertension As dictated above, blood pressure markedly elevated.  Since this is a new finding, renal artery stenosis at this age group need to be excluded, in case his hypertension is confirmed, he will need renal artery duplex.  Previously he was started on losartan, but  he has discontinued this.  Patient does have aortic atherosclerosis but did not want to be on a statin.  Lipids are normal.  I would like to see him back in 6 weeks to close the loop.    Adrian Prows, MD, [facility] 12/02/2022, 1:55 PM Office: [phone] Fax: [phone] Pager: [phone]

## 2022-12-31 ENCOUNTER — Ambulatory Visit: Payer: Medicare Other | Admitting: Cardiology

## 2022-12-31 ENCOUNTER — Encounter: Payer: Self-pay | Admitting: Cardiology

## 2022-12-31 ENCOUNTER — Other Ambulatory Visit: Payer: Self-pay | Admitting: Cardiology

## 2022-12-31 VITALS — BP 116/80 | HR 89 | Ht 72.0 in | Wt 226.0 lb

## 2022-12-31 DIAGNOSIS — N1831 Chronic kidney disease, stage 3a: Secondary | ICD-10-CM

## 2022-12-31 DIAGNOSIS — R030 Elevated blood-pressure reading, without diagnosis of hypertension: Secondary | ICD-10-CM

## 2022-12-31 DIAGNOSIS — I7121 Aneurysm of the ascending aorta, without rupture: Secondary | ICD-10-CM

## 2022-12-31 MED ORDER — RAMIPRIL 2.5 MG PO CAPS: 2.5000 mg | ORAL_CAPSULE | Freq: Every evening | ORAL | 1 refills | Status: DC

## 2022-12-31 NOTE — Progress Notes (Unsigned)
Primary Physician/Referring:  Janene Harvey, MD  Patient ID: Christian Parsons, male    DOB: 04/19/1946, 77 y.o.   MRN: [id number]  Chief Complaint  Patient presents with   Aneurysm of ascending aorta without rupture   Elevated blood pressure reading in office without diagnosis   HPI:    BRONX BROGDEN  is a 77 y.o. Caucasian male patient with known ascending aortic aneurysm at 4.4 to 4.5 cm diagnosed in 2020, stage IIIa chronic kidney disease secondary to lithium toxicity, who is very active and walks at least 30 miles a week and also hikes. He has no history of hypertension, hyperlipidemia or tobacco use disorder or family history of aortic aneurysm.  He presents for a 6-week follow-up visit of elevated blood pressure.  Past Medical History:  Diagnosis Date   Anxiety    BPH with urinary obstruction    s/p TURP   Chronic kidney disease    Depression    Stage 3a chronic kidney disease (CKD)    secondary to lithium toxicity; baseline creatinine 1.3-1.6   Past Surgical History:  Procedure Laterality Date   CYSTOSCOPY  2024   HERNIA REPAIR     PROSTATE SURGERY     TRANSURETHRAL RESECTION OF PROSTATE     Family History  Problem Relation Age of Onset   Cancer Mother        breast cancer   Dementia Father    Arthritis Sister        hip OA s/p hip replacement    Social History   Tobacco Use   Smoking status: Never   Smokeless tobacco: Never  Substance Use Topics   Alcohol use: Not Currently    Alcohol/week: 2.0 standard drinks of alcohol    Types: 2 Standard drinks or equivalent per week    Comment: Stop drinking 6-7 months ago   Marital Status: Married  ROS  Review of Systems  Cardiovascular:  Negative for chest pain, dyspnea on exertion and leg swelling.   Objective  Blood pressure 116/80, pulse 89, height 6' (1.829 m), weight 226 lb (102.5 kg), SpO2 96 %. Body mass index is 30.65 kg/m.     12/31/2022    2:21 PM 12/31/2022    2:14 PM 12/02/2022    1:27 PM   Vitals with BMI  Height  6\' 0"    Weight  226 lbs   BMI  30.64   Systolic 116 136 409  Diastolic 80 91 90  Pulse 89 91 81     Physical Exam Neck:     Vascular: No carotid bruit or JVD.  Cardiovascular:     Rate and Rhythm: Normal rate and regular rhythm.     Pulses: Intact distal pulses.     Heart sounds: Normal heart sounds. No murmur heard.    No gallop.  Pulmonary:     Effort: Pulmonary effort is normal.     Breath sounds: Normal breath sounds.  Abdominal:     General: Bowel sounds are normal.     Palpations: Abdomen is soft.     Hernia: A hernia is present. Hernia is present in the ventral area.  Musculoskeletal:     Right lower leg: No edema.     Left lower leg: No edema.      Laboratory examination:    Lab Results  Component Value Date   NA 142 04/17/2022   K 4.4 04/17/2022   CO2 28 04/17/2022   GLUCOSE 103 (H) 04/17/2022  BUN 24 (H) 04/17/2022   CREATININE 1.71 (H) 04/17/2022   CALCIUM 9.6 04/17/2022   EGFR 45 (L) 08/27/2021   GFRNONAA 45 (L) 09/28/2019       Latest Ref Rng & Units 04/17/2022    8:55 AM 08/27/2021    4:32 PM 07/16/2021   12:49 PM  CMP  Glucose 70 - 99 mg/dL 782  83  99   BUN 6 - 23 mg/dL Creatinine 0.40 - 1.50 mg/dL 9.56  2.13  0.86   Sodium 135 - 145 mEq/L 142  140  143   Potassium 3.5 - 5.1 mEq/L 4.4  4.5  4.5   Chloride 96 - 112 mEq/L 106  102  103   CO2 19 - 32 mEq/L Calcium 8.4 - 10.5 mg/dL 9.6  8.2  9.7   Total Protein 6.0 - 8.3 g/dL 7.4     Total Bilirubin 0.2 - 1.2 mg/dL 0.7     Alkaline Phos 39 - 117 U/L 72     AST 0 - 37 U/L 24     ALT 0 - 53 U/L 25         Latest Ref Rng & Units 04/17/2022    8:55 AM 12/12/2020    8:49 AM 03/27/2020    2:51 PM  CBC  WBC 4.0 - 10.5 K/uL 4.5  4.5  5.1   Hemoglobin 13.0 - 17.0 g/dL 57.8  46.9  62.9   Hematocrit 39.0 - 52.0 % 46.5  44.1  40.6   Platelets 150.0 - 400.0 K/uL 143.0  141.0      Lipid Panel Recent Labs    04/17/22 0855  CHOL 140  TRIG 96.0   LDLCALC 86  VLDL 19.2  HDL 34.80*  CHOLHDL 4   HEMOGLOBIN A1C Lab Results  Component Value Date   HGBA1C 5.8 04/17/2022   External labs 12/16/2022:  Total cholesterol 137, triglycerides 137, HDL 32, LDL 81.  Non-HDL cholesterol 105.  Urinary albumin to creatinine ratio 93 mg/g creatinine.  PTH is normal at 73.  Calcium normal at 9.4.  Vitamin D 37.0.  TSH normal at 1.23.  Serum glucose 83 mg, BUN 21, creatinine 1.54, EGFR 47 mL, potassium 4.6.  LFTs normal.  Hb 15.5/HCT 44.0, platelets 158.  Normal indicis.   Radiology:   CT of the abdomen 10/20/2018: No evidence of AAA. Vascular/Lymphatic: Atherosclerotic calcifications of aorta and iliac arteries. Aorta normal caliber. No adenopathy.  MR angio of the chest 07/30/2019: 1. Mild fusiform aneurysmal dilatation of the ascending thoracic aorta measuring 44 mm in maximal diameter. Recommend annual imaging followup by CTA or MRA. 2. Borderline cardiomegaly. 3. Previously characterized benign hepatic hemangioma is morphologically unchanged compared to remote abdominal CT 04/2014.  Cardiac Studies:   Exercise nuclear stress test 06/23/2019: Patient exercised on Bruce protocol for 9 minutes and achieved 10.1 METS. Normal nuclear perfusion, LVEF 57%, low risk.  PCV ECHOCARDIOGRAM COMPLETE 07/31/2022  Narrative Echocardiogram 07/31/2022: Left ventricle cavity is normal in size. Moderate concentric hypertrophy of the left ventricle. Normal global wall motion. Normal LV systolic function with EF 54%. Doppler evidence of grade I (impaired) diastolic dysfunction, normal LAP. Structurally normal trileaflet aortic valve.  Moderate (Grade II) aortic regurgitation. The aortic root is dilated, measuring 4.3 cm at sinus of Valsalva.  Ascending aortic aneurysm, measuring 4.5 cm, aortic arch measuring 3.6 cm. Compared to previous study on 07/19/2021 & 05/10/2019, grade 1 diastolic dysfunction is  new. No other significant change noted.  EKG:    EKG 12/02/2022: Sinus rhythm with first-degree AV block at rate of 73 bpm, left middle enlargement, right bundle branch block.  Compared to 07/16/2021, no significant change.    Allergies   Allergies  Allergen Reactions   Levaquin [Levofloxacin] Other (See Comments)    Avoid fluoroquinolones if possible due to AAA.      Medication list     Current Outpatient Medications:    aspirin EC 81 MG tablet, Take 81 mg by mouth daily., Disp: , Rfl:    lamoTRIgine (LAMICTAL) 150 MG tablet, Take 150 mg by mouth daily., Disp: , Rfl:    LORazepam (ATIVAN) 0.5 MG tablet, Take 1 tablet (0.5 mg total) by mouth as needed., Disp: 30 tablet, Rfl: 0   rizatriptan (MAXALT) 10 MG tablet, May repeat in 2 hours if needed, Disp: 10 tablet, Rfl: 2   ramipril (ALTACE) 2.5 MG capsule, Take 1 capsule (2.5 mg total) by mouth every evening., Disp: 30 capsule, Rfl: 1   Assessment     ICD-10-CM   1. Elevated blood pressure reading in office without diagnosis of hypertension  R03.0     2. Stage 3a chronic kidney disease  N18.31 ramipril (ALTACE) 2.5 MG capsule    3. Aneurysm of ascending aorta without rupture  I71.21       There are no discontinued medications.  Meds ordered this encounter  Medications   ramipril (ALTACE) 2.5 MG capsule    Sig: Take 1 capsule (2.5 mg total) by mouth every evening.    Dispense:  30 capsule    Refill:  1   No orders of the defined types were placed in this encounter.  Recommendations:   KEYMANI MCLEAN is a 77 y.o.   Caucasian male patient with known ascending aortic aneurysm at 4.4 to 4.5 cm diagnosed in 2020, stage IIIa chronic kidney disease secondary to lithium toxicity, who is very active and walks at least 30 miles a week and also hikes. He has no history of hypertension, hyperlipidemia or tobacco use disorder or family history of aortic aneurysm.  He presents for a 6-week follow-up visit of elevated blood pressure.  1. Elevated blood pressure reading in office  without diagnosis of hypertension ***  2. Stage 3a chronic kidney disease ***  3. Aneurysm of ascending aorta without rupture ***   1. Aneurysm of ascending aorta without rupture [facility]) Patient has moderate-sized ascending aortic aneurysm measuring around 4.4 to 4.5 cm by MRI of the chest, there is been a good correlation between radiologic scanning and echocardiogram hence we will continue annual echocardiogram and consider repeating CT scan probably next year.  I would like to start him on an ACE inhibitor or an ARB specifically losartan in view of aortic aneurysm.  He does have underlying stage IIIa chronic kidney disease and this has to be monitored closely.  His blood pressure is markedly elevated upon presentation and remained high throughout examination as well.  Concerning stuff is that the diastolic blood pressure was high.  He will continue to monitor his blood pressure at home, this morning his blood pressure was 120/70 mmHg.  He will bring his blood pressure cuff to his next office visit for calibration as well.  He has an appointment to see his PCP in 2 weeks, he is updating labs and he will bring those labs to me as well.  Although beta-blocker would be appropriate in view of ascending aortic aneurysm, in view  of his age and also underlying first-degree AV block and right bundle branch block giving a bifascicular block, I do not want to use a beta-blocker.  2. Aortic atherosclerosis (HCC) His lipids are normal.  He does have aortic atherosclerosis but this is probably age-appropriate.  3. Stage 3a chronic kidney disease (HCC) Chronic kidney disease has remained stable.  He has changed his PCP.  4. Elevated blood pressure reading in office without diagnosis of hypertension As dictated above, blood pressure markedly elevated.  Since this is a new finding, renal artery stenosis at this age group need to be excluded, in case his hypertension is confirmed, he will need renal artery  duplex.  Previously he was started on losartan, but he has discontinued this.  Patient does have aortic atherosclerosis but did not want to be on a statin.  Lipids are normal.  I would like to see him back in 6 weeks to close the loop.    Yates Decamp, MD, [facility] 12/31/2022, 2:48 PM Office: [phone] Fax: [phone] Pager: [phone]

## 2023-01-03 ENCOUNTER — Telehealth: Payer: Self-pay

## 2023-01-03 NOTE — Telephone Encounter (Signed)
Patient stated that he read on the new medication Ramipril, and it stated that Aspirin is a medication that should not be taken with it. Patient would like to know if he needs to stop Aspirin , or continue it.

## 2023-01-08 ENCOUNTER — Other Ambulatory Visit: Payer: Self-pay

## 2023-01-08 MED ORDER — LOSARTAN POTASSIUM 25 MG PO TABS: 25.0000 mg | ORAL_TABLET | Freq: Every day | ORAL | 3 refills | Status: DC

## 2023-01-30 ENCOUNTER — Telehealth: Payer: Self-pay

## 2023-01-30 NOTE — Telephone Encounter (Signed)
Patient has appointment with Dr. Lucia Gaskins Nephrolog[facility] at the [place], [place] office. No further action needed.

## 2023-01-30 NOTE — Telephone Encounter (Signed)
Patient would like to go to a Nephrologist at the [facility]. He is concerned about his kidney function. His gums are swollen and inflamed. He is developing swollen and inflamed gums and cavities. Last checkup his dental checkup was great no issues. His dentist feels that his kidney issues could be contributing to these issues. He is going to call back and give the name of the doctor. Can you do a referral? If not he can call

## 2023-03-06 ENCOUNTER — Encounter: Payer: Self-pay | Admitting: Cardiology

## 2023-03-12 ENCOUNTER — Ambulatory Visit: Payer: Medicare Other | Admitting: Cardiology

## 2023-03-12 ENCOUNTER — Encounter: Payer: Self-pay | Admitting: Cardiology

## 2023-03-12 VITALS — BP 128/82 | HR 68 | Ht 72.0 in | Wt 228.0 lb

## 2023-03-12 DIAGNOSIS — I10 Essential (primary) hypertension: Secondary | ICD-10-CM

## 2023-03-12 DIAGNOSIS — N1831 Chronic kidney disease, stage 3a: Secondary | ICD-10-CM

## 2023-03-12 DIAGNOSIS — I7121 Aneurysm of the ascending aorta, without rupture: Secondary | ICD-10-CM

## 2023-03-12 MED ORDER — METOPROLOL SUCCINATE ER 25 MG PO TB24
25.0000 mg | ORAL_TABLET | ORAL | 2 refills | Status: DC
Start: 2023-03-12 — End: 2023-06-05

## 2023-03-12 NOTE — Progress Notes (Addendum)
Primary Physician/Referring:  Estevan Oaks, NP  Patient ID: Christian Parsons, male    DOB: 19-May-1946, 77 y.o.   MRN: [id number]  Chief Complaint  Patient presents with   Hypertension   Follow-up   HPI:    Christian Parsons  is a 77 y.o. Caucasian male patient with known ascending aortic aneurysm at 4.4 to 4.5 cm diagnosed in 2020, stage IIIa chronic kidney disease secondary to lithium toxicity, who is very active and walks at least 30 miles a week and also hikes.    On his last office visit months ago I recommended that he has hypertension that he should be treated with an ACE inhibitor or an ARB.  He was evaluated in [facility] clinic and saw a nephrologist there who also recommended continuing Altace and recommended in fact blood pressure be better treated and may need additional medication.  He now presents for 26-month follow-up.  He is tolerating all his medications well and continues to remain active.   Past Medical History:  Diagnosis Date   Anxiety    BPH with urinary obstruction    s/p TURP   Chronic kidney disease    Depression    Stage 3a chronic kidney disease (CKD) (HCC)    secondary to lithium toxicity; baseline creatinine 1.3-1.6   Past Surgical History:  Procedure Laterality Date   CYSTOSCOPY  2024   HERNIA REPAIR     PROSTATE SURGERY     TRANSURETHRAL RESECTION OF PROSTATE     Family History  Problem Relation Age of Onset   Cancer Mother        breast cancer   Dementia Father    Arthritis Sister        hip OA s/p hip replacement    Social History   Tobacco Use   Smoking status: Never   Smokeless tobacco: Never  Substance Use Topics   Alcohol use: Not Currently    Alcohol/week: 2.0 standard drinks of alcohol    Types: 2 Standard drinks or equivalent per week    Comment: Stop drinking 6-7 months ago   Marital Status: Married  ROS  Review of Systems  Cardiovascular:  Negative for chest pain, dyspnea on exertion and leg swelling.   Objective  Blood  pressure 128/82, pulse 68, height 6' (1.829 m), weight 228 lb (103.4 kg), SpO2 96 %. Body mass index is 30.92 kg/m.     03/12/2023    1:09 PM 12/31/2022    2:21 PM 12/31/2022    2:14 PM  Vitals with BMI  Height 6\' 0"   6\' 0"   Weight 228 lbs  226 lbs  BMI 30.92  30.64  Systolic 128 116 784  Diastolic 82 80 91  Pulse 68 89 91     Physical Exam Neck:     Vascular: No carotid bruit or JVD.  Cardiovascular:     Rate and Rhythm: Normal rate and regular rhythm.     Pulses: Intact distal pulses.     Heart sounds: Murmur heard.     Early systolic murmur is present with a grade of 1/6 at the upper right sternal border.     No gallop.  Pulmonary:     Effort: Pulmonary effort is normal.     Breath sounds: Normal breath sounds.  Abdominal:     General: Bowel sounds are normal.     Palpations: Abdomen is soft.     Hernia: A hernia is present. Hernia is present in the ventral area.  Musculoskeletal:     Right lower leg: No edema.     Left lower leg: No edema.      Laboratory examination:    Lab Results  Component Value Date   NA 142 04/17/2022   K 4.4 04/17/2022   CO2 28 04/17/2022   GLUCOSE 103 (H) 04/17/2022   BUN 24 (H) 04/17/2022   CREATININE 1.71 (H) 04/17/2022   CALCIUM 9.6 04/17/2022   EGFR 45 (L) 08/27/2021   GFRNONAA 45 (L) 09/28/2019       Latest Ref Rng & Units 04/17/2022    8:55 AM 08/27/2021    4:32 PM 07/16/2021   12:49 PM  CMP  Glucose 70 - 99 mg/dL 865  83  99   BUN 6 - 23 mg/dL 24  20  23    Creatinine 0.40 - 1.50 mg/dL 7.84  6.96  2.95   Sodium 135 - 145 mEq/L 142  140  143   Potassium 3.5 - 5.1 mEq/L 4.4  4.5  4.5   Chloride 96 - 112 mEq/L 106  102  103   CO2 19 - 32 mEq/L 28  23  25    Calcium 8.4 - 10.5 mg/dL 9.6  8.2  9.7   Total Protein 6.0 - 8.3 g/dL 7.4     Total Bilirubin 0.2 - 1.2 mg/dL 0.7     Alkaline Phos 39 - 117 U/L 72     AST 0 - 37 U/L 24     ALT 0 - 53 U/L 25         Latest Ref Rng & Units 04/17/2022    8:55 AM 12/12/2020    8:49 AM  03/27/2020    2:51 PM  CBC  WBC 4.0 - 10.5 K/uL 4.5  4.5  5.1   Hemoglobin 13.0 - 17.0 g/dL 28.4  13.2  44.0   Hematocrit 39.0 - 52.0 % 46.5  44.1  40.6   Platelets 150.0 - 400.0 K/uL 143.0  141.0      Lipid Panel Recent Labs    04/17/22 0855  CHOL 140  TRIG 96.0  LDLCALC 86  VLDL 19.2  HDL 34.80*  CHOLHDL 4   HEMOGLOBIN A1C Lab Results  Component Value Date   HGBA1C 5.8 04/17/2022   External labs:  02/24/2023:  Serum glucose 99 mg, BUN 17, creatinine 1.50, EGFR 49 mm, potassium 4.7.  Labs 12/16/2022:  Total cholesterol 137, triglycerides 137, HDL 32, LDL 81.  Non-HDL cholesterol 105.  Urinary albumin to creatinine ratio 93 mg/g creatinine.  PTH is normal at 73.  Calcium normal at 9.4.  Vitamin D 37.0.  TSH normal at 1.23.  Serum glucose 83 mg, BUN 21, creatinine 1.54, EGFR 47 mL, potassium 4.6.  LFTs normal.  Hb 15.5/HCT 44.0, platelets 158.  Normal indicis.   Radiology:   CT of the abdomen 10/20/2018: No evidence of AAA. Vascular/Lymphatic: Atherosclerotic calcifications of aorta and iliac arteries. Aorta normal caliber. No adenopathy.  MR angio of the chest 07/30/2019: 1. Mild fusiform aneurysmal dilatation of the ascending thoracic aorta measuring 44 mm in maximal diameter. Recommend annual imaging followup by CTA or MRA. 2. Borderline cardiomegaly. 3. Previously characterized benign hepatic hemangioma is morphologically unchanged compared to remote abdominal CT 04/2014.  Cardiac Studies:   Exercise nuclear stress test 06/23/2019: Patient exercised on Bruce protocol for 9 minutes and achieved 10.1 METS. Normal nuclear perfusion, LVEF 57%, low risk.  PCV ECHOCARDIOGRAM COMPLETE 07/31/2022  Narrative Echocardiogram 07/31/2022: Left ventricle cavity is normal in size.  Moderate concentric hypertrophy of the left ventricle. Normal global wall motion. Normal LV systolic function with EF 54%. Doppler evidence of grade I (impaired) diastolic dysfunction, normal  LAP. Structurally normal trileaflet aortic valve.  Moderate (Grade II) aortic regurgitation. The aortic root is dilated, measuring 4.3 cm at sinus of Valsalva.  Ascending aortic aneurysm, measuring 4.5 cm, aortic arch measuring 3.6 cm. Compared to previous study on 07/19/2021 & 05/10/2019, grade 1 diastolic dysfunction is new. No other significant change noted.  EKG:   EKG 12/02/2022: Sinus rhythm with first-degree AV block at rate of 73 bpm, left middle enlargement, right bundle branch block.  Compared to 07/16/2021, no significant change.   Allergies   Allergies  Allergen Reactions   Levaquin [Levofloxacin] Other (See Comments)    Avoid fluoroquinolones if possible due to AAA.      Medication list     Current Outpatient Medications:    aspirin EC 81 MG tablet, Take 81 mg by mouth daily., Disp: , Rfl:    lamoTRIgine (LAMICTAL) 150 MG tablet, Take 150 mg by mouth every evening., Disp: , Rfl:    LORazepam (ATIVAN) 0.5 MG tablet, Take 1 tablet (0.5 mg total) by mouth as needed., Disp: 30 tablet, Rfl: 0   metoprolol succinate (TOPROL-XL) 25 MG 24 hr tablet, Take 1 tablet (25 mg total) by mouth every morning. Take with or immediately following a meal., Disp: 30 tablet, Rfl: 2   ramipril (ALTACE) 2.5 MG capsule, Take 2.5 mg by mouth daily., Disp: , Rfl:    SUMAtriptan (IMITREX) 25 MG tablet, Take 25 mg by mouth every 2 (two) hours as needed., Disp: , Rfl:    Assessment     ICD-10-CM   1. Primary hypertension  I10 metoprolol succinate (TOPROL-XL) 25 MG 24 hr tablet    2. Stage 3a chronic kidney disease (HCC)  N18.31     3. Aneurysm of ascending aorta without rupture (HCC)  I71.21 metoprolol succinate (TOPROL-XL) 25 MG 24 hr tablet      Medications Discontinued During This Encounter  Medication Reason   rizatriptan (MAXALT) 10 MG tablet Change in therapy   losartan (COZAAR) 25 MG tablet Change in therapy    Meds ordered this encounter  Medications   metoprolol succinate (TOPROL-XL)  25 MG 24 hr tablet    Sig: Take 1 tablet (25 mg total) by mouth every morning. Take with or immediately following a meal.    Dispense:  30 tablet    Refill:  2   No orders of the defined types were placed in this encounter.  Recommendations:   KRISTION HOLIFIELD is a 77 y.o.   Caucasian male patient with known ascending aortic aneurysm at 4.4 to 4.5 cm diagnosed in 2020, stage IIIa chronic kidney disease secondary to lithium toxicity, who is very active and walks at least 30 miles a week and also hikes.  On his last office visit months ago I recommended that he has hypertension that he should be treated with an ACE inhibitor or an ARB.  1. Primary hypertension Patient took a second opinion from [facility] clinic nephrology and he was also recommended to continue Altace, urine analysis revealed improvement in proteinuria and his blood pressure is also much improved.  They in fact recommended that his hypertension need to be better treated.  Patient has ascending aortic aneurysm and in view of this, I would like to start him on metoprolol succinate 25 mg in the morning and he will continue with Altace 2.5 mg  in the evening.  It appears that the patient is a super responder to you on minimal dose of medications, hence we will continue low-dose medications.  Patient is also extremely careful and cautious about any medication changes.  - metoprolol succinate (TOPROL-XL) 25 MG 24 hr tablet; Take 1 tablet (25 mg total) by mouth every morning. Take with or immediately following a meal.  Dispense: 30 tablet; Refill: 2  2. Stage 3a chronic kidney disease (HCC) I reviewed his external labs, in spite of being on Altace, renal function has remained stable.  3. Aneurysm of ascending aorta without rupture [facility]) He has been scheduled for echocardiogram in October 2024 for follow-up of aortic aneurysm, I will follow-up on this and see him back after the echocardiogram.  If patient tolerates metoprolol succinate,  will send for 90-day Rx. - metoprolol succinate (TOPROL-XL) 25 MG 24 hr tablet; Take 1 tablet (25 mg total) by mouth every morning. Take with or immediately following a meal.  Dispense: 30 tablet; Refill: 2  Other orders - ramipril (ALTACE) 2.5 MG capsule; Take 2.5 mg by mouth daily. - SUMAtriptan (IMITREX) 25 MG tablet; Take 25 mg by mouth every 2 (two) hours as needed.     Yates Decamp, MD, [facility] 03/12/2023, 1:38 PM Office: [phone] Fax: [phone] Pager: [phone]      CC: CC: Dr. Cinda Quest, [facility] nephrology)

## 2023-06-05 ENCOUNTER — Other Ambulatory Visit: Payer: Self-pay | Admitting: Cardiology

## 2023-06-05 DIAGNOSIS — I7121 Aneurysm of the ascending aorta, without rupture: Secondary | ICD-10-CM

## 2023-06-05 DIAGNOSIS — I10 Essential (primary) hypertension: Secondary | ICD-10-CM

## 2023-06-25 ENCOUNTER — Other Ambulatory Visit: Payer: Medicare Other

## 2023-07-07 ENCOUNTER — Ambulatory Visit: Payer: Self-pay | Admitting: Cardiology

## 2023-07-12 ENCOUNTER — Other Ambulatory Visit: Payer: Self-pay | Admitting: Cardiology

## 2023-07-25 ENCOUNTER — Other Ambulatory Visit: Payer: Self-pay | Admitting: Cardiology

## 2023-07-25 DIAGNOSIS — I7121 Aneurysm of the ascending aorta, without rupture: Secondary | ICD-10-CM

## 2023-07-25 DIAGNOSIS — I10 Essential (primary) hypertension: Secondary | ICD-10-CM

## 2023-08-05 ENCOUNTER — Encounter: Payer: Self-pay | Admitting: Cardiology

## 2023-08-05 ENCOUNTER — Ambulatory Visit: Payer: Medicare Other | Attending: Cardiology | Admitting: Cardiology

## 2023-08-05 VITALS — BP 118/82 | HR 64 | Resp 16 | Ht 72.0 in | Wt 229.8 lb

## 2023-08-05 DIAGNOSIS — I7121 Aneurysm of the ascending aorta, without rupture: Secondary | ICD-10-CM | POA: Diagnosis present

## 2023-08-05 DIAGNOSIS — I700 Atherosclerosis of aorta: Secondary | ICD-10-CM | POA: Diagnosis not present

## 2023-08-05 DIAGNOSIS — N1831 Chronic kidney disease, stage 3a: Secondary | ICD-10-CM | POA: Diagnosis present

## 2023-08-05 DIAGNOSIS — I10 Essential (primary) hypertension: Secondary | ICD-10-CM | POA: Diagnosis not present

## 2023-08-05 NOTE — Patient Instructions (Signed)
Medication Instructions:  Your physician recommends that you continue on your current medications as directed. Please refer to the Current Medication list given to you today.  *If you need a refill on your cardiac medications before your next appointment, please call your pharmacy*   Lab Work: none If you have labs (blood work) drawn today and your tests are completely normal, you will receive your results only by: MyChart Message (if you have MyChart) OR A paper copy in the mail If you have any lab test that is abnormal or we need to change your treatment, we will call you to review the results.   Testing/Procedures: Your physician has requested that you have an echocardiogram. Echocardiography is a painless test that uses sound waves to create images of your heart. It provides your doctor with information about the size and shape of your heart and how well your heart's chambers and valves are working. This procedure takes approximately one hour. There are no restrictions for this procedure. Please do NOT wear cologne, perfume, aftershave, or lotions (deodorant is allowed). Please arrive 15 minutes prior to your appointment time.  Please note: We ask at that you not bring children with you during ultrasound (echo/ vascular) testing. Due to room size and safety concerns, children are not allowed in the ultrasound rooms during exams. Our front office staff cannot provide observation of children in our lobby area while testing is being conducted. An adult accompanying a patient to their appointment will only be allowed in the ultrasound room at the discretion of the ultrasound technician under special circumstances. We apologize for any inconvenience.    Follow-Up: At [facility], you and your health needs are our priority.  As part of our continuing mission to provide you with exceptional heart care, we have created designated Provider Care Teams.  These Care Teams include your  primary Cardiologist (physician) and Advanced Practice Providers (APPs -  Physician Assistants and Nurse Practitioners) who all work together to provide you with the care you need, when you need it.  We recommend signing up for the patient portal called "MyChart".  Sign up information is provided on this After Visit Summary.  MyChart is used to connect with patients for Virtual Visits (Telemedicine).  Patients are able to view lab/test results, encounter notes, upcoming appointments, etc.  Non-urgent messages can be sent to your provider as well.   To learn more about what you can do with MyChart, go to [email or web address].    Your next appointment:   6 month(s)  Provider:   Yates Decamp, MD     Other Instructions

## 2023-08-05 NOTE — Progress Notes (Signed)
Cardiology Office Note:  .   Date:  08/05/2023  ID:  Christian Parsons, DOB [birth year], MRN [id number] PCP: Thana Ates, MD  [facility] Providers Cardiologist:  Yates Decamp, MD   Chief Complaint  Patient presents with   Primary hypertension   Aneurysm of ascending aorta without rupture    Follow-up    6 months      History of Present Illness: .   Christian Parsons is a 77 y.o. Caucasian male patient with known ascending aortic aneurysm at 4.4 to 4.5 cm diagnosed in 2020, stage IIIa chronic kidney disease secondary to lithium toxicity, who is very active and walks at least 30 miles a week and also hikes.  Patient is very particular regarding his medication use and also follows up at [facility] clinic.  Discussed the use of AI scribe software for clinical note transcription with the patient, who gave verbal consent to proceed.  History of Present Illness   The patient, with a known history of an ascending aortic aneurysm, presents for a routine follow-up. He reports no new symptoms or concerns. He is currently on ramipril and metoprolol for blood pressure control and management of the aneurysm, and a baby aspirin. The patient reports no problems with his current medication regimen. He has been physically active and has an upcoming echocardiogram scheduled to monitor the status of the aneurysm.        Review of Systems  Cardiovascular:  Negative for chest pain, dyspnea on exertion and leg swelling.    Labs   External Labs:  02/24/2023:   Serum glucose 99 mg, BUN 17, creatinine 1.50, EGFR 49 mm, potassium 4.7.   Labs 12/16/2022:   Total cholesterol 137, triglycerides 137, HDL 32, LDL 81.  Non-HDL cholesterol 105.   Hb 15.5/HCT 44.0, platelets 158.  Normal indicis   Physical Exam:   VS:  BP 118/82 (BP Location: Left Arm, Patient Position: Sitting, Cuff Size: Large)   Pulse 64   Resp 16   Ht 6' (1.829 m)   Wt 229 lb 12.8 oz (104.2 kg)   SpO2 97%   BMI 31.17 kg/m    Wt  Readings from Last 3 Encounters:  08/05/23 229 lb 12.8 oz (104.2 kg)  03/12/23 228 lb (103.4 kg)  12/31/22 226 lb (102.5 kg)     Physical Exam Neck:     Vascular: No carotid bruit or JVD.  Cardiovascular:     Rate and Rhythm: Normal rate and regular rhythm.     Pulses: Intact distal pulses.     Heart sounds: Normal heart sounds. No murmur heard.    No gallop.  Pulmonary:     Effort: Pulmonary effort is normal.     Breath sounds: Normal breath sounds.  Abdominal:     General: Bowel sounds are normal.     Palpations: Abdomen is soft.  Musculoskeletal:     Right lower leg: No edema.     Left lower leg: No edema.     Studies Reviewed: Marland Kitchen    MR angio of the chest 07/30/2019: 1. Mild fusiform aneurysmal dilatation of the ascending thoracic aorta measuring 44 mm in maximal diameter. Recommend annual imaging followup by CTA or MRA. 2. Borderline cardiomegaly. 3. Previously characterized benign hepatic hemangioma is morphologically unchanged compared to remote abdominal CT 04/2014.  Echocardiogram 07/31/2022: Left ventricle cavity is normal in size. Moderate concentric hypertrophy of the left ventricle. Normal global wall motion. Normal LV systolic function with EF 54%. Doppler evidence  of grade I (impaired) diastolic dysfunction, normal LAP. Structurally normal trileaflet aortic valve.  Moderate (Grade II) aortic regurgitation. The aortic root is dilated, measuring 4.3 cm at sinus of Valsalva.  Ascending aortic aneurysm, measuring 4.5 cm, aortic arch measuring 3.6 cm. Compared to previous study on 07/19/2021 & 05/10/2019, grade 1 diastolic dysfunction is new. No other significant change noted.  EKG:    EKG 12/02/2022: Sinus rhythm with first-degree AV block at rate of 73 bpm, left middle enlargement, right bundle branch block. Compared to 07/16/2021, no significant change.   Medications and allergies    Allergies  Allergen Reactions   Levaquin [Levofloxacin] Other (See Comments)     Avoid fluoroquinolones if possible due to AAA.    Current Meds  Medication Sig   aspirin EC 81 MG tablet Take 81 mg by mouth daily.   lamoTRIgine (LAMICTAL) 150 MG tablet Take 150 mg by mouth every evening.   LORazepam (ATIVAN) 0.5 MG tablet Take 1 tablet (0.5 mg total) by mouth as needed.   metoprolol succinate (TOPROL-XL) 25 MG 24 hr tablet TAKE 1 TABLET(25 MG) BY MOUTH EVERY MORNING WITH OR IMMEDIATELY FOLLOWING A MEAL   ramipril (ALTACE) 2.5 MG capsule Take 1 capsule (2.5 mg total) by mouth daily.     ASSESSMENT AND PLAN: .      ICD-10-CM   1. Aneurysm of ascending aorta without rupture (HCC)  I71.21 ECHOCARDIOGRAM COMPLETE    2. Primary hypertension  I10     3. Stage 3a chronic kidney disease (HCC)  N18.31     4. Aortic atherosclerosis (HCC)  I70.0       There are no diagnoses linked to this encounter.  Assessment and Plan    Ascending Aortic Aneurysm Stable at 4.5 cm since 2020. No changes in physical exam. Patient is on appropriate medications including Ramipril and Metoprolol. -Reschedule echocardiogram to be done sooner than 12/31/2022. -Continue current medications:Ramipril, Metoprolol, and baby Aspirin.  Aspirin is due to aortic atherosclerosis noted on prior CT scan of the chest and abdomen.  Fortunately he does not have abdominal aortic aneurysm. His lipids are normal, as per prior discussions, he is not on a statin.  Patient is very particular regarding any of his medications that he takes.  -Depending on echocardiogram results, consider ordering a CT scan.  Hypertension Well controlled with current medications. Blood pressure today is 118/82. -Continue current medications:Ramipril 2.5mg  and Metoprolol.  Stage IIIa chronic kidney disease Patient is tolerating Altace 2.5 mg daily, renal function has remained stable, external labs reviewed.  General Health Maintenance / Followup Plans -Annual follow-up visit. -Check patient's access to MyChart.     Signed,  Yates Decamp, MD, [facility] 08/05/2023, 10:58 AM [facility] [address] [place], [place] [address] Phone: [phone]. Fax:  [phone]

## 2023-09-15 ENCOUNTER — Ambulatory Visit (HOSPITAL_COMMUNITY): Payer: Medicare Other | Attending: Cardiology

## 2023-09-15 DIAGNOSIS — I7121 Aneurysm of the ascending aorta, without rupture: Secondary | ICD-10-CM | POA: Diagnosis not present

## 2023-09-15 LAB — ECHOCARDIOGRAM COMPLETE
Area-P 1/2: 2.2 cm2
P 1/2 time: 608 ms
S' Lateral: 2.3 cm

## 2023-12-01 ENCOUNTER — Other Ambulatory Visit: Payer: Self-pay

## 2023-12-01 ENCOUNTER — Ambulatory Visit: Payer: PRIVATE HEALTH INSURANCE | Attending: Internal Medicine | Admitting: Physical Therapy

## 2023-12-01 DIAGNOSIS — R2681 Unsteadiness on feet: Secondary | ICD-10-CM | POA: Insufficient documentation

## 2023-12-01 DIAGNOSIS — M6281 Muscle weakness (generalized): Secondary | ICD-10-CM | POA: Diagnosis present

## 2023-12-01 DIAGNOSIS — R2689 Other abnormalities of gait and mobility: Secondary | ICD-10-CM | POA: Insufficient documentation

## 2023-12-01 NOTE — Therapy (Signed)
 OUTPATIENT PHYSICAL THERAPY LOWER EXTREMITY EVALUATION   Patient Name: Christian Parsons MRN: [id number] DOB:11/07/1945, 78 y.o., male Today's Date: 12/01/2023  END OF SESSION:  PT End of Session - 12/01/23 1454     Visit Number 1    Number of Visits 16    Date for PT Re-Evaluation 01/26/24    Authorization Type Medicare    PT Start Time 1454    PT Stop Time 1530    PT Time Calculation (min) 36 min    Activity Tolerance Patient tolerated treatment well    Behavior During Therapy WFL for tasks assessed/performed             Past Medical History:  Diagnosis Date   Anxiety    BPH with urinary obstruction    s/p TURP   Chronic kidney disease    Depression    Stage 3a chronic kidney disease (CKD) (HCC)    secondary to lithium toxicity; baseline creatinine 1.3-1.6   Past Surgical History:  Procedure Laterality Date   CYSTOSCOPY  2024   HERNIA REPAIR     PROSTATE SURGERY     TRANSURETHRAL RESECTION OF PROSTATE     Patient Active Problem List   Diagnosis Date Noted   Aortic aneurysm without rupture (HCC) 06/14/2021   Abdominal aortic aneurysm (AAA) without rupture (HCC) 06/14/2021   Atherosclerosis of aorta (HCC) 11/18/2017   Left sided sciatica 11/18/2017   Pes planus 03/13/2017   Knee pain 03/11/2017   BPH with urinary obstruction 08/12/2016   CKD (chronic kidney disease) stage 2 or early 3 07/11/2015   Ventral hernia without obstruction or gangrene 02/17/2015   Diastasis recti 12/22/2014   Erectile dysfunction 12/22/2014   Benign essential tremor 06/16/2013   Migraine headache with aura 12/25/2011   BMI 31.0-31.9,adult 12/25/2011   Right bundle branch block 12/25/2011   Mood disorder (HCC)     PCP: Thana Ates, MD  REFERRING PROVIDER: Thana Ates, MD  REFERRING DIAG: R26.89 (ICD-10-CM) - Other abnormalities of gait and mobility  THERAPY DIAG:  Unsteadiness on feet  Other abnormalities of gait and mobility  Muscle weakness (generalized)  Rationale  for Evaluation and Treatment: Rehabilitation  ONSET DATE: <5 years slow deterioration  SUBJECTIVE:   SUBJECTIVE STATEMENT: Pt reports he is on hiking trails a lot. Has noticed that he has to be more careful about his footing on uneven ground and roots. Around the house he hasn't fallen but experience near falls with turns. Pt [place]. Was hiking with a men's group but has not been able to keep up. Pt had a fall to the L in a car body shop on 3/11 and hurt his R hip but it is feeling 75% better. Pt notes that his general health declined after a hernia surgery which led to a TURP.   PERTINENT HISTORY: From H&P: "78 year old male with H/O HTN, ascending aortic aneurysm, bipolar disorder, BPH-status post TURP 2016, ED and migraine headaches-seen for follow-up of CKD 3."  Per PT order from Dr. Margaretann Loveless: "pt very good endurance and regular hiking but slowly progressive balance problems, teetering, falls. Requesting help with focus on balance"  PAIN:  Are you having pain? No currently Will get some pain in R lateral hip when crossing legs in figure 4 position  PRECAUTIONS: Ascending aortic aneurysm, falls  RED FLAGS: None   WEIGHT BEARING RESTRICTIONS: No  FALLS:  Has patient fallen in last 6 months? Yes. Number of falls 1 --  [employer] standing at counter and turned to the left and fell when his foot/shoe caught on the floor  LIVING ENVIRONMENT: Lives with: lives with their spouse Lives in: House/apartment Stairs: Yes: Internal: 1 flight steps; on right going up Has following equipment at home: None  OCCUPATION: Retired -- hiking 3-4 times a week 5 miles  PLOF: Independent  PATIENT GOALS: Improve balance for safer hiking on trails; big reaching goal is to return to men's group hiking (has not been able to keep up)  NEXT MD VISIT: n/a  OBJECTIVE:  Note: Objective measures were completed at Evaluation unless otherwise  noted.  DIAGNOSTIC FINDINGS: n/a  PATIENT SURVEYS:  Total ABC score: 1290 / 1600 = 80.6 %  COGNITION: Overall cognitive status: Within functional limits for tasks assessed     SENSATION: WFL "A touch of plantar fasciitis with pins/needles feeling"  EDEMA:  No swelling  MUSCLE LENGTH: Did not assess  POSTURE: rounded shoulders and forward head. Bilat foot pronation.   PALPATION: R greater trochanter/glute med tenderness s/p fall   LOWER EXTREMITY ROM:  Active ROM Right eval Left eval  Hip flexion    Hip extension    Hip abduction    Hip adduction    Hip internal rotation    Hip external rotation    Knee flexion    Knee extension    Ankle dorsiflexion    Ankle plantarflexion    Ankle inversion    Ankle eversion     (Blank rows = not tested)  LOWER EXTREMITY MMT:  MMT Right eval Left eval  Hip flexion 5 5  Hip extension 4- 4  Hip abduction 3+ 3+  Hip adduction    Hip internal rotation    Hip external rotation    Knee flexion 5 5  Knee extension 5 5  Ankle dorsiflexion 5 5  Ankle plantarflexion    Ankle inversion 5 5  Ankle eversion 5 5   (Blank rows = not tested)  LOWER EXTREMITY SPECIAL TESTS:  Did not assess  FUNCTIONAL TESTS:  5 times sit to stand: 19.94 sec Plank forearm/feet 34.94 sec (R knee slightly bent) Berg Balance: 49/56 FGA: 17/30   [facility] PT Assessment - 12/01/23 0001       Standardized Balance Assessment   Standardized Balance Assessment Berg Balance Test      Berg Balance Test   Sit to Stand Able to stand without using hands and stabilize independently    Standing Unsupported Able to stand safely 2 minutes    Sitting with Back Unsupported but Feet Supported on Floor or Stool Able to sit safely and securely 2 minutes    Stand to Sit Sits safely with minimal use of hands    Transfers Able to transfer safely, minor use of hands    Standing Unsupported with Eyes Closed Able to stand 10 seconds safely    Standing Unsupported  with Feet Together Able to place feet together independently and stand 1 minute safely    From Standing, Reach Forward with Outstretched Arm Can reach confidently >25 cm (10")    From Standing Position, Pick up Object from Floor Able to pick up shoe safely and easily    From Standing Position, Turn to Look Behind Over each Shoulder Looks behind one side only/other side shows less weight shift    Turn 360 Degrees Able to turn 360 degrees safely but slowly   5 sec R&L   Standing Unsupported, Alternately Place Feet on Step/Stool Able  to complete 4 steps without aid or supervision    Standing Unsupported, One Foot in Front Able to plae foot ahead of the other independently and hold 30 seconds    Standing on One Leg Able to lift leg independently and hold 5-10 seconds   L: 4 sec, R: 4 sec   Total Score 49      Functional Gait  Assessment   Gait assessed  Yes    Gait Level Surface Walks 20 ft in less than 7 sec but greater than 5.5 sec, uses assistive device, slower speed, mild gait deviations, or deviates 6-10 in outside of the 12 in walkway width.   6.13 sec   Change in Gait Speed Able to change speed, demonstrates mild gait deviations, deviates 6-10 in outside of the 12 in walkway width, or no gait deviations, unable to achieve a major change in velocity, or uses a change in velocity, or uses an assistive device.    Gait with Horizontal Head Turns Performs head turns smoothly with slight change in gait velocity (eg, minor disruption to smooth gait path), deviates 6-10 in outside 12 in walkway width, or uses an assistive device.    Gait with Vertical Head Turns Performs task with slight change in gait velocity (eg, minor disruption to smooth gait path), deviates 6 - 10 in outside 12 in walkway width or uses assistive device    Gait and Pivot Turn Pivot turns safely within 3 sec and stops quickly with no loss of balance.    Step Over Obstacle Is able to step over one shoe box (4.5 in total height)  without changing gait speed. No evidence of imbalance.    Gait with Narrow Base of Support Ambulates less than 4 steps heel to toe or cannot perform without assistance.    Gait with Eyes Closed Walks 20 ft, slow speed, abnormal gait pattern, evidence for imbalance, deviates 10-15 in outside 12 in walkway width. Requires more than 9 sec to ambulate 20 ft.    Ambulating Backwards Walks 20 ft, slow speed, abnormal gait pattern, evidence for imbalance, deviates 10-15 in outside 12 in walkway width.    Steps Alternating feet, must use rail.    Total Score 17             GAIT: Distance walked: Into clinic Assistive device utilized: None Level of assistance: Complete Independence Comments: Forward head, thoracic kyphosis, mild truncal lean                                                                                                                                TREATMENT DATE:  Education only today    PATIENT EDUCATION:  Education details: Exam findings, POC, initial HEP Person educated: Patient Education method: Explanation, Demonstration, and Handouts Education comprehension: verbalized understanding, returned demonstration, and needs further education  HOME EXERCISE PROGRAM: To be initiated  ASSESSMENT:  CLINICAL IMPRESSION: Patient is a 78 y.o. M who  was seen today for physical therapy evaluation and treatment for decreased balance. PMH significant for AAA and TURP. Pt is active at baseline and likes to hike ~5 miles 3-4 days a week. PT assessment found pt to have weak gluteals and core strength with decreased balance most notable with pt changing directions/speeds along with narrower base of support. Pt will highly benefit from PT to address these deficits for improved mobility and safety with his community and recreational tasks.   OBJECTIVE IMPAIRMENTS: Abnormal gait, decreased activity tolerance, decreased balance, decreased endurance, decreased mobility, decreased  strength, improper body mechanics, and postural dysfunction.   ACTIVITY LIMITATIONS: bending, standing, squatting, and locomotion level  PARTICIPATION LIMITATIONS: meal prep, cleaning, community activity, and recreational activities such as hiking  PERSONAL FACTORS: Age, Fitness, Past/current experiences, and Time since onset of injury/illness/exacerbation are also affecting patient's functional outcome.   REHAB POTENTIAL: Good  CLINICAL DECISION MAKING: Evolving/moderate complexity  EVALUATION COMPLEXITY: Moderate   GOALS: Goals reviewed with patient? Yes  SHORT TERM GOALS: Target date: 12/29/2023  Pt will be ind with initial HEP Baseline: Goal status: INITIAL  2.  Pt will have improved 5x STS to </=13 sec to demo increasing functional hip strength Baseline:  Goal status: INITIAL  LONG TERM GOALS: Target date: 01/26/2024   Pt will be ind with management and progression of HEP Baseline:  Goal status: INITIAL  2.  Pt will have improved 5x STS to </=13 sec to demo increased functional LE strength and low fall risk Baseline:  Goal status: INITIAL  3.  Pt will have improved FGA to >/=26/30 to be considered low fall risk Baseline:  Goal status: INITIAL  4.  Pt will have improved ABC scale to >/=93% to demo MDC Baseline:  Goal status: INITIAL  5.  Pt will report >/=50% improvement with his balance while walking trails and inclines Baseline:  Goal status: INITIAL   PLAN:  PT FREQUENCY: 2x/week  PT DURATION: 8 weeks  PLANNED INTERVENTIONS: 97164- PT Re-evaluation, 97110-Therapeutic exercises, 97530- Therapeutic activity, 97112- Neuromuscular re-education, 97535- Self Care, 04540- Manual therapy, [phone]- Gait training, [phone]- Aquatic Therapy, Patient/Family education, Balance training, Stair training, Dry Needling, Joint mobilization, Cryotherapy, and Moist heat  PLAN FOR NEXT SESSION: Initiate strength and balance exercises for hip strength/stability. Work on  [employer]. High level balance for hiking.    Kerryn Tennant April Ma L Cassidy Tashiro, PT 12/01/2023, 3:57 PM

## 2023-12-03 ENCOUNTER — Ambulatory Visit: Admitting: Physical Therapy

## 2023-12-03 ENCOUNTER — Telehealth: Payer: Self-pay | Admitting: Physical Therapy

## 2023-12-03 NOTE — Telephone Encounter (Signed)
 Physical therapist called patient given no show; patient reports that his schedule did not show any dates for this month but showed all dates for next month. Possible that front of schedule had not printed/been given to patient. Physical therapist was able to reschedule patient on Friday; made aware of time and date. Recommend reprinting appointments at next scheduled PT visit to ensure patient had full schedule.   Maryruth Eve, PT, DPT

## 2023-12-05 ENCOUNTER — Ambulatory Visit: Payer: PRIVATE HEALTH INSURANCE | Admitting: Physical Therapy

## 2023-12-05 ENCOUNTER — Encounter: Payer: Self-pay | Admitting: Physical Therapy

## 2023-12-05 ENCOUNTER — Telehealth: Payer: Self-pay | Admitting: Physical Therapy

## 2023-12-05 VITALS — BP 130/86 | HR 60

## 2023-12-05 DIAGNOSIS — R2681 Unsteadiness on feet: Secondary | ICD-10-CM

## 2023-12-05 DIAGNOSIS — R2689 Other abnormalities of gait and mobility: Secondary | ICD-10-CM

## 2023-12-05 DIAGNOSIS — M6281 Muscle weakness (generalized): Secondary | ICD-10-CM

## 2023-12-05 NOTE — Telephone Encounter (Signed)
 Physical therapist called and LVM for patient's PCP regarding recommendation for SLP referral to address patient's reports and concerns of memory deficits.   Maryruth Eve, PT, DPT

## 2023-12-05 NOTE — Therapy (Signed)
 OUTPATIENT PHYSICAL THERAPY LOWER EXTREMITY TREATMENT    Patient Name: Christian Parsons MRN: [id number] DOB:[birth year], 78 y.o., male Today's Date: 12/05/2023  END OF SESSION:  PT End of Session - 12/05/23 1023     Visit Number 2    Number of Visits 16    Date for PT Re-Evaluation 01/26/24    Authorization Type Medicare    PT Start Time 1017    PT Stop Time 1100    PT Time Calculation (min) 43 min    Equipment Utilized During Treatment Gait belt    Activity Tolerance Patient tolerated treatment well    Behavior During Therapy WFL for tasks assessed/performed             Past Medical History:  Diagnosis Date   Anxiety    BPH with urinary obstruction    s/p TURP   Chronic kidney disease    Depression    Stage 3a chronic kidney disease (CKD) (HCC)    secondary to lithium toxicity; baseline creatinine 1.3-1.6   Past Surgical History:  Procedure Laterality Date   CYSTOSCOPY  2024   HERNIA REPAIR     PROSTATE SURGERY     TRANSURETHRAL RESECTION OF PROSTATE     Patient Active Problem List   Diagnosis Date Noted   Aortic aneurysm without rupture (HCC) 06/14/2021   Abdominal aortic aneurysm (AAA) without rupture (HCC) 06/14/2021   Atherosclerosis of aorta (HCC) 11/18/2017   Left sided sciatica 11/18/2017   Pes planus 03/13/2017   Knee pain 03/11/2017   BPH with urinary obstruction 08/12/2016   CKD (chronic kidney disease) stage 2 or early 3 07/11/2015   Ventral hernia without obstruction or gangrene 02/17/2015   Diastasis recti 12/22/2014   Erectile dysfunction 12/22/2014   Benign essential tremor 06/16/2013   Migraine headache with aura 12/25/2011   BMI 31.0-31.9,adult 12/25/2011   Right bundle branch block 12/25/2011   Mood disorder (HCC)     PCP: Thana Ates, MD  REFERRING PROVIDER: Thana Ates, MD  REFERRING DIAG: R26.89 (ICD-10-CM) - Other abnormalities of gait and mobility  THERAPY DIAG:  Unsteadiness on feet  Other abnormalities of gait and  mobility  Muscle weakness (generalized)  Rationale for Evaluation and Treatment: Rehabilitation  ONSET DATE: <5 years slow deterioration  SUBJECTIVE:   SUBJECTIVE STATEMENT: Patient denies any falls and near falls. Wants to clarify schedules. Patient was hiking yesterday without any hiking sticks. Patient has a slight resting tremors that appears L greater than R reports that he took medication that caused a dyskinesia that he has had since the '70s.   PERTINENT HISTORY: From H&P: "78 year old male with H/O HTN, ascending aortic aneurysm, bipolar disorder, BPH-status post TURP 2016, ED and migraine headaches-seen for follow-up of CKD 3."  Per PT order from Dr. Margaretann Loveless: "pt very good endurance and regular hiking but slowly progressive balance problems, teetering, falls. Requesting help with focus on balance"  PAIN:  Are you having pain? No currently Will get some pain in R lateral hip when crossing legs in figure 4 position  PRECAUTIONS: Ascending aortic aneurysm, falls  RED FLAGS: None   WEIGHT BEARING RESTRICTIONS: No  FALLS:  Has patient fallen in last 6 months? Yes. Number of falls 1 -- [employer] standing at counter and turned to the left and fell when his foot/shoe caught on the floor  LIVING ENVIRONMENT: Lives with: lives with their spouse Lives in: House/apartment Stairs: Yes: Internal: 1 flight steps; on right going up Has following  equipment at home: None  OCCUPATION: Retired -- hiking 3-4 times a week 5 miles  PLOF: Independent  PATIENT GOALS: Improve balance for safer hiking on trails; big reaching goal is to return to men's group hiking (has not been able to keep up)  NEXT MD VISIT: n/a  OBJECTIVE:  Note: Objective measures were completed at Evaluation unless otherwise noted.  DIAGNOSTIC FINDINGS: n/a  PATIENT SURVEYS:  Total ABC score: 1290 / 1600 = 80.6 %  COGNITION: Overall cognitive status: Within functional limits for tasks  assessed     SENSATION: WFL "A touch of plantar fasciitis with pins/needles feeling"  EDEMA:  No swelling  MUSCLE LENGTH: Did not assess  POSTURE: rounded shoulders and forward head. Bilat foot pronation.   PALPATION: R greater trochanter/glute med tenderness s/p fall   LOWER EXTREMITY ROM:  Active ROM Right eval Left eval  Hip flexion    Hip extension    Hip abduction    Hip adduction    Hip internal rotation    Hip external rotation    Knee flexion    Knee extension    Ankle dorsiflexion    Ankle plantarflexion    Ankle inversion    Ankle eversion     (Blank rows = not tested)  LOWER EXTREMITY MMT:  MMT Right eval Left eval  Hip flexion 5 5  Hip extension 4- 4  Hip abduction 3+ 3+  Hip adduction    Hip internal rotation    Hip external rotation    Knee flexion 5 5  Knee extension 5 5  Ankle dorsiflexion 5 5  Ankle plantarflexion    Ankle inversion 5 5  Ankle eversion 5 5   (Blank rows = not tested)  LOWER EXTREMITY SPECIAL TESTS:  Did not assess  FUNCTIONAL TESTS:  5 times sit to stand: 19.94 sec Plank forearm/feet 34.94 sec (R knee slightly bent) Berg Balance: 49/56 FGA: 17/30  GAIT: Distance walked: Into clinic Assistive device utilized: None Level of assistance: Complete Independence Comments: Forward head, thoracic kyphosis, mild truncal lean                                                                                                                                TREATMENT DATE:   TherAct: Printout of schedule out of order thus why patient missed last appointment, reordered and restapled scheduled so in correct sequence, reviewed when next appointment Coordination Testing Supination Pronation: grossly WFL Heel tapping: grossly WFL Finger to nose: mild dymsetric of L hand but very mild Okay symbol testing: initially smaller amplitude on L but improves with repetition  Discuss tremor noted intermittently - patient reports known  history of tremor/dykinesia since high school  Patient reports noting changes in short term memory, discussed   Gait: GAIT: Gait pattern: decreased arm swing- Left, lateral lean- Left, and wide BOS Distance walked: various clinic distances Assistive device utilized: None Level of assistance: SBA Comments: truncal lean,  more unsteadiness noted with turns around clinic     GAIT: Gait pattern: decreased arm swing- Left, lateral lean- Left, and wide BOS Distance walked: 1 x 230', 1 x 115' Assistive device utilized: bilateral trekking poles and single trekking pole in RUE Level of assistance: modified I Comments: reduced truncal lean and steadiness with two poles instead of one, appropriate and safe for hiking, encouraged patient to get trekking poles   GAIT: Gait pattern: decreased arm swing- Left, lateral lean- Left, and wide BOS Distance walked: 2 x 115' Assistive device utilized: [facility] with rubber quad tip Level of assistance: SBA Comments: trialed L and R, some challenges sequencing, reduced trunk abnormality more when in L hand, would recommend further trial before recommending for home  NMR:  Corner balance with chair in front EC static NBOS 2 x 10 reps, min sway EC with horizontal head turns 2 x 10 resps, mod sway EC with vertical head turns 1 x 10 reps, mod sway Discussed setup for home and added to HEP    PATIENT EDUCATION:  Education details: Initial HEP, exam findings, recommend bilateral trekking poles for all future hikes  Person educated: Patient Education method: Programmer, multimedia, Demonstration, and Handouts Education comprehension: verbalized understanding, returned demonstration, and needs further education  HOME EXERCISE PROGRAM: Access Code: [email or web address] URL: [email or web address] Date: 12/05/2023 Prepared by: Maryruth Eve  Exercises - Corner Balance Feet Together: Eyes Open With Head Turns  - 1 x daily - 7 x weekly - 3 sets - 10  reps  ASSESSMENT:  CLINICAL IMPRESSION: Skilled physical therapy session emphasized further neuro screen, AD recommendations, and basic balance HEP. Patient presenting with gait deficits more notable with turns and likely exaggerated by L lateral trunk lean. Patient benefited from use of bilateral trekking poles in today's session and recommend for use in future with all hikes. [facility] for daily activity use but recommend further trial before recommending for home. Patient memory does appear to have some impairment and anticipate that part of balance deficits may be impacted by cognition. Recommending SLP referral for cogntiive screening and treatment as indicated. Patient in agreement. Continue POC.   OBJECTIVE IMPAIRMENTS: Abnormal gait, decreased activity tolerance, decreased balance, decreased endurance, decreased mobility, decreased strength, improper body mechanics, and postural dysfunction.   ACTIVITY LIMITATIONS: bending, standing, squatting, and locomotion level  PARTICIPATION LIMITATIONS: meal prep, cleaning, community activity, and recreational activities such as hiking  PERSONAL FACTORS: Age, Fitness, Past/current experiences, and Time since onset of injury/illness/exacerbation are also affecting patient's functional outcome.   REHAB POTENTIAL: Good  CLINICAL DECISION MAKING: Evolving/moderate complexity  EVALUATION COMPLEXITY: Moderate   GOALS: Goals reviewed with patient? Yes  SHORT TERM GOALS: Target date: 12/29/2023  Pt will be ind with initial HEP Baseline: Goal status: INITIAL  2.  Pt will have improved 5x STS to </=13 sec to demo increasing functional hip strength Baseline:  Goal status: INITIAL  LONG TERM GOALS: Target date: 01/26/2024   Pt will be ind with management and progression of HEP Baseline:  Goal status: INITIAL  2.  Pt will have improved 5x STS to </=13 sec to demo increased functional LE strength and low fall risk Baseline:  Goal status:  INITIAL  3.  Pt will have improved FGA to >/=26/30 to be considered low fall risk Baseline:  Goal status: INITIAL  4.  Pt will have improved ABC scale to >/=93% to demo MDC Baseline:  Goal status: INITIAL  5.  Pt will report >/=50% improvement with his balance while walking  trails and inclines Baseline:  Goal status: INITIAL   PLAN:  PT FREQUENCY: 2x/week  PT DURATION: 8 weeks  PLANNED INTERVENTIONS: 97164- PT Re-evaluation, 97110-Therapeutic exercises, 97530- Therapeutic activity, 97112- Neuromuscular re-education, 97535- Self Care, 16109- Manual therapy, [phone]- Gait training, [phone]- Aquatic Therapy, Patient/Family education, Balance training, Stair training, Dry Needling, Joint mobilization, Cryotherapy, and Moist heat  PLAN FOR NEXT SESSION: Progress strength and balance exercises for hip strength/stability. Work on [employer]. High level balance for hiking, AD recommendations as indicated (hurrycane for turns?), SLP referral follow up   Carmelia Bake, PT, DPT 12/05/2023, 11:45 AM

## 2023-12-08 ENCOUNTER — Other Ambulatory Visit: Payer: Self-pay | Admitting: Internal Medicine

## 2023-12-08 ENCOUNTER — Ambulatory Visit: Admitting: Physical Therapy

## 2023-12-08 DIAGNOSIS — R413 Other amnesia: Secondary | ICD-10-CM

## 2023-12-08 DIAGNOSIS — R2681 Unsteadiness on feet: Secondary | ICD-10-CM | POA: Diagnosis not present

## 2023-12-08 DIAGNOSIS — R2689 Other abnormalities of gait and mobility: Secondary | ICD-10-CM

## 2023-12-08 DIAGNOSIS — M6281 Muscle weakness (generalized): Secondary | ICD-10-CM

## 2023-12-08 NOTE — Therapy (Signed)
 OUTPATIENT PHYSICAL THERAPY LOWER EXTREMITY TREATMENT    Patient Name: Christian Parsons MRN: [id number] DOB:1946/03/24, 78 y.o., male Today's Date: 12/08/2023  END OF SESSION:  PT End of Session - 12/08/23 1444     Visit Number 3    Number of Visits 16    Date for PT Re-Evaluation 01/26/24    Authorization Type Medicare    PT Start Time 1445    PT Stop Time 1525    PT Time Calculation (min) 40 min    Equipment Utilized During Treatment Gait belt    Activity Tolerance Patient tolerated treatment well    Behavior During Therapy WFL for tasks assessed/performed              Past Medical History:  Diagnosis Date   Anxiety    BPH with urinary obstruction    s/p TURP   Chronic kidney disease    Depression    Stage 3a chronic kidney disease (CKD) (HCC)    secondary to lithium toxicity; baseline creatinine 1.3-1.6   Past Surgical History:  Procedure Laterality Date   CYSTOSCOPY  2024   HERNIA REPAIR     PROSTATE SURGERY     TRANSURETHRAL RESECTION OF PROSTATE     Patient Active Problem List   Diagnosis Date Noted   Aortic aneurysm without rupture (HCC) 06/14/2021   Abdominal aortic aneurysm (AAA) without rupture (HCC) 06/14/2021   Atherosclerosis of aorta (HCC) 11/18/2017   Left sided sciatica 11/18/2017   Pes planus 03/13/2017   Knee pain 03/11/2017   BPH with urinary obstruction 08/12/2016   CKD (chronic kidney disease) stage 2 or early 3 07/11/2015   Ventral hernia without obstruction or gangrene 02/17/2015   Diastasis recti 12/22/2014   Erectile dysfunction 12/22/2014   Benign essential tremor 06/16/2013   Migraine headache with aura 12/25/2011   BMI 31.0-31.9,adult 12/25/2011   Right bundle branch block 12/25/2011   Mood disorder (HCC)     PCP: Thana Ates, MD  REFERRING PROVIDER: Thana Ates, MD  REFERRING DIAG: R26.89 (ICD-10-CM) - Other abnormalities of gait and mobility  THERAPY DIAG:  No diagnosis found.  Rationale for Evaluation and  Treatment: Rehabilitation  ONSET DATE: <5 years slow deterioration  SUBJECTIVE:   SUBJECTIVE STATEMENT: Pt states he followed up with his PCP this morning. Will have an MRI performed but it is not yet scheduled. Went hiking this weekend with his hiking poles.   PERTINENT HISTORY: From H&P: "78 year old male with H/O HTN, ascending aortic aneurysm, bipolar disorder, BPH-status post TURP 2016, ED and migraine headaches-seen for follow-up of CKD 3."  Per PT order from Dr. Margaretann Loveless: "pt very good endurance and regular hiking but slowly progressive balance problems, teetering, falls. Requesting help with focus on balance"  PAIN:  Are you having pain? No currently Will get some pain in R lateral hip when crossing legs in figure 4 position  PRECAUTIONS: Ascending aortic aneurysm, falls  RED FLAGS: None   WEIGHT BEARING RESTRICTIONS: No  FALLS:  Has patient fallen in last 6 months? Yes. Number of falls 1 -- [employer] standing at counter and turned to the left and fell when his foot/shoe caught on the floor  LIVING ENVIRONMENT: Lives with: lives with their spouse Lives in: House/apartment Stairs: Yes: Internal: 1 flight steps; on right going up Has following equipment at home: None  OCCUPATION: Retired -- hiking 3-4 times a week 5 miles  PLOF: Independent  PATIENT GOALS: Improve balance for safer hiking on trails;  big reaching goal is to return to men's group hiking (has not been able to keep up)  NEXT MD VISIT: n/a  OBJECTIVE:  Note: Objective measures were completed at Evaluation unless otherwise noted.  DIAGNOSTIC FINDINGS: n/a  PATIENT SURVEYS:  Total ABC score: 1290 / 1600 = 80.6 %  COGNITION: Overall cognitive status: Short term memory impairment?     SENSATION: WFL "A touch of plantar fasciitis with pins/needles feeling"  POSTURE: rounded shoulders and forward head. Bilat foot pronation.   PALPATION: R greater trochanter/glute med tenderness s/p fall   LOWER  EXTREMITY ROM:  Active ROM Right eval Left eval  Hip flexion    Hip extension    Hip abduction    Hip adduction    Hip internal rotation    Hip external rotation    Knee flexion    Knee extension    Ankle dorsiflexion    Ankle plantarflexion    Ankle inversion    Ankle eversion     (Blank rows = not tested)  LOWER EXTREMITY MMT:  MMT Right eval Left eval  Hip flexion 5 5  Hip extension 4- 4  Hip abduction 3+ 3+  Hip adduction    Hip internal rotation    Hip external rotation    Knee flexion 5 5  Knee extension 5 5  Ankle dorsiflexion 5 5  Ankle plantarflexion    Ankle inversion 5 5  Ankle eversion 5 5   (Blank rows = not tested)  LOWER EXTREMITY SPECIAL TESTS:  Did not assess  FUNCTIONAL TESTS:  5 times sit to stand: 19.94 sec Plank forearm/feet 34.94 sec (R knee slightly bent) Berg Balance: 49/56 FGA: 17/30  GAIT: Distance walked: Into clinic Assistive device utilized: None Level of assistance: Complete Independence Comments: Forward head, thoracic kyphosis, mild truncal lean                                                                                                                                TREATMENT DATE:  Sci fit L7 x 6 min UEs/LEs maintaining >70 SPM Side step yellow TB x4 laps in // bars Monster walk fwd/bwd yellow TB x4 laps in // bars Feet together EC head nods & head turns 2x30" each Staggered stance on incline, EO head nods and head turns 2x30" alternating which foot was in front Staggered stance on decline, EO head nods and head turns 2x30" alternating which foot was in front Marching on incline with hiking poles (alternate arm & leg flexion) 2x10 Marching on decline with hiking poles (alternate arm & leg flexion) 2x10 Amb around gym with PT assist for arm swing with increased pace and larger steps Amb around gym with hiking poles using 4 point gait pattern -- pt has difficulty coordinating this correctly    PATIENT EDUCATION:   Education details: Initial HEP, exam findings, recommend bilateral trekking poles for all future hikes  Person educated: Patient Education method: Explanation,  Demonstration, and Handouts Education comprehension: verbalized understanding, returned demonstration, and needs further education  HOME EXERCISE PROGRAM: Access Code: [email or web address] URL: [email or web address] Date: 12/05/2023 Prepared by: Maryruth Eve  Exercises - Corner Balance Feet Together: Eyes Open With Head Turns  - 1 x daily - 7 x weekly - 3 sets - 10 reps  ASSESSMENT:  CLINICAL IMPRESSION: Treatment session focused on reviewing pt's HEP. Pt with decreased memory and did not remember that prior PT had printed out his exercise for home. Did remember to bring his hiking poles. Initiated hip strengthening this session. Continued to work on gait and dynamic balance. Found pt to have reduced arm swing -- worked on increasing step length and utilizing greater arm swing to perform higher gait speed. Able to perform independently without PT tactile cueing by end of session. Encouraged pt to work on his arm swing during gait. Pt with difficulty coordinating 4 point gait pattern with hiking poles -- told patient to hold on working on this at home for now.   OBJECTIVE IMPAIRMENTS: Abnormal gait, decreased activity tolerance, decreased balance, decreased endurance, decreased mobility, decreased strength, improper body mechanics, and postural dysfunction.     GOALS: Goals reviewed with patient? Yes  SHORT TERM GOALS: Target date: 12/29/2023  Pt will be ind with initial HEP Baseline: Goal status: INITIAL  2.  Pt will have improved 5x STS to </=13 sec to demo increasing functional hip strength Baseline:  Goal status: INITIAL  LONG TERM GOALS: Target date: 01/26/2024   Pt will be ind with management and progression of HEP Baseline:  Goal status: INITIAL  2.  Pt will have improved 5x STS to </=13 sec to demo increased  functional LE strength and low fall risk Baseline:  Goal status: INITIAL  3.  Pt will have improved FGA to >/=26/30 to be considered low fall risk Baseline:  Goal status: INITIAL  4.  Pt will have improved ABC scale to >/=93% to demo MDC Baseline:  Goal status: INITIAL  5.  Pt will report >/=50% improvement with his balance while walking trails and inclines Baseline:  Goal status: INITIAL   PLAN:  PT FREQUENCY: 2x/week  PT DURATION: 8 weeks  PLANNED INTERVENTIONS: 97164- PT Re-evaluation, 97110-Therapeutic exercises, 97530- Therapeutic activity, 97112- Neuromuscular re-education, 97535- Self Care, 21308- Manual therapy, [phone]- Gait training, [phone]- Aquatic Therapy, Patient/Family education, Balance training, Stair training, Dry Needling, Joint mobilization, Cryotherapy, and Moist heat  PLAN FOR NEXT SESSION: Progress strength and balance exercises for hip strength/stability. Work on [employer]. High level balance for hiking, hiking/gait with hiking poles (4 point pattern?), SLP referral follow up   [facility] April Ma L Jayleah Garbers, PT, DPT 12/08/2023, 2:50 PM

## 2023-12-10 ENCOUNTER — Ambulatory Visit: Payer: PRIVATE HEALTH INSURANCE | Admitting: Physical Therapy

## 2023-12-10 DIAGNOSIS — R2681 Unsteadiness on feet: Secondary | ICD-10-CM

## 2023-12-10 DIAGNOSIS — R2689 Other abnormalities of gait and mobility: Secondary | ICD-10-CM

## 2023-12-10 DIAGNOSIS — M6281 Muscle weakness (generalized): Secondary | ICD-10-CM

## 2023-12-10 NOTE — Therapy (Signed)
 OUTPATIENT PHYSICAL THERAPY LOWER EXTREMITY TREATMENT    Patient Name: Christian Parsons MRN: [id number] DOB:06/05/1946, 78 y.o., male Today's Date: 12/10/2023  END OF SESSION:  PT End of Session - 12/10/23 1356     Visit Number 4    Number of Visits 16    Date for PT Re-Evaluation 01/26/24    Authorization Type Medicare    PT Start Time 1400    PT Stop Time 1440    PT Time Calculation (min) 40 min    Equipment Utilized During Treatment Gait belt    Activity Tolerance Patient tolerated treatment well    Behavior During Therapy WFL for tasks assessed/performed             Past Medical History:  Diagnosis Date   Anxiety    BPH with urinary obstruction    s/p TURP   Chronic kidney disease    Depression    Stage 3a chronic kidney disease (CKD) (HCC)    secondary to lithium toxicity; baseline creatinine 1.3-1.6   Past Surgical History:  Procedure Laterality Date   CYSTOSCOPY  2024   HERNIA REPAIR     PROSTATE SURGERY     TRANSURETHRAL RESECTION OF PROSTATE     Patient Active Problem List   Diagnosis Date Noted   Aortic aneurysm without rupture (HCC) 06/14/2021   Abdominal aortic aneurysm (AAA) without rupture (HCC) 06/14/2021   Atherosclerosis of aorta (HCC) 11/18/2017   Left sided sciatica 11/18/2017   Pes planus 03/13/2017   Knee pain 03/11/2017   BPH with urinary obstruction 08/12/2016   CKD (chronic kidney disease) stage 2 or early 3 07/11/2015   Ventral hernia without obstruction or gangrene 02/17/2015   Diastasis recti 12/22/2014   Erectile dysfunction 12/22/2014   Benign essential tremor 06/16/2013   Migraine headache with aura 12/25/2011   BMI 31.0-31.9,adult 12/25/2011   Right bundle branch block 12/25/2011   Mood disorder (HCC)     PCP: Thana Ates, MD  REFERRING PROVIDER: Thana Ates, MD  REFERRING DIAG: R26.89 (ICD-10-CM) - Other abnormalities of gait and mobility  THERAPY DIAG:  No diagnosis found.  Rationale for Evaluation and  Treatment: Rehabilitation  ONSET DATE: <5 years slow deterioration  SUBJECTIVE:   SUBJECTIVE STATEMENT: Pt states he went on a 3 mile hike. Felt like "it clicked in" this morning (in regards to his arm swing with gait). Pt states he's been practicing. Pt states he felt okay after last session.   PERTINENT HISTORY: From H&P: "78 year old male with H/O HTN, ascending aortic aneurysm, bipolar disorder, BPH-status post TURP 2016, ED and migraine headaches-seen for follow-up of CKD 3."  Per PT order from Dr. Margaretann Loveless: "pt very good endurance and regular hiking but slowly progressive balance problems, teetering, falls. Requesting help with focus on balance"  PAIN:  Are you having pain? No currently Will get some pain in R lateral hip when crossing legs in figure 4 position  PRECAUTIONS: Ascending aortic aneurysm, falls  RED FLAGS: None   WEIGHT BEARING RESTRICTIONS: No  FALLS:  Has patient fallen in last 6 months? Yes. Number of falls 1 -- [employer] standing at counter and turned to the left and fell when his foot/shoe caught on the floor  LIVING ENVIRONMENT: Lives with: lives with their spouse Lives in: House/apartment Stairs: Yes: Internal: 1 flight steps; on right going up Has following equipment at home: None  OCCUPATION: Retired -- hiking 3-4 times a week 5 miles  PLOF: Independent  PATIENT GOALS:  Improve balance for safer hiking on trails; big reaching goal is to return to men's group hiking (has not been able to keep up)  NEXT MD VISIT: n/a  OBJECTIVE:  Note: Objective measures were completed at Evaluation unless otherwise noted.  DIAGNOSTIC FINDINGS: n/a  PATIENT SURVEYS:  Total ABC score: 1290 / 1600 = 80.6 %  COGNITION: Overall cognitive status: Short term memory impairment?     SENSATION: WFL "A touch of plantar fasciitis with pins/needles feeling"  POSTURE: rounded shoulders and forward head. Bilat foot pronation.   PALPATION: R greater  trochanter/glute med tenderness s/p fall   LOWER EXTREMITY ROM:  Active ROM Right eval Left eval  Hip flexion    Hip extension    Hip abduction    Hip adduction    Hip internal rotation    Hip external rotation    Knee flexion    Knee extension    Ankle dorsiflexion    Ankle plantarflexion    Ankle inversion    Ankle eversion     (Blank rows = not tested)  LOWER EXTREMITY MMT:  MMT Right eval Left eval  Hip flexion 5 5  Hip extension 4- 4  Hip abduction 3+ 3+  Hip adduction    Hip internal rotation    Hip external rotation    Knee flexion 5 5  Knee extension 5 5  Ankle dorsiflexion 5 5  Ankle plantarflexion    Ankle inversion 5 5  Ankle eversion 5 5   (Blank rows = not tested)  LOWER EXTREMITY SPECIAL TESTS:  Did not assess  FUNCTIONAL TESTS:  5 times sit to stand: 19.94 sec Plank forearm/feet 34.94 sec (R knee slightly bent) Berg Balance: 49/56 FGA: 17/30  GAIT: Distance walked: Into clinic Assistive device utilized: None Level of assistance: Complete Independence Comments: Forward head, thoracic kyphosis, mild truncal lean                                                                                                                                TREATMENT DATE: 12/10/2023 Sci fit L7 hills x 6 min UEs/LEs maintaining >70 SPM Stabilizing on one leg, hip 3 way with foot on wash rag 3x5 Stabilizing on one leg, toe tap on therastones fwd, side and back 2x10 Staggered stance on incline, EO head nods and head turns 2x30" alternating which foot was in front Staggered stance on decline, EO head nods and head turns 2x30" alternating which foot was in front Marching on decline with hiking poles (alternate arm & leg flexion) 2x10 Amb around gym with PT assist for arm swing with increased pace and larger steps Amb around gym with hiking poles using 4 point gait pattern -- pt has difficulty coordinating this correctly    PATIENT EDUCATION:  Education  details: Initial HEP, exam findings, recommend bilateral trekking poles for all future hikes  Person educated: Patient Education method: Explanation, Demonstration, and Handouts Education comprehension: verbalized understanding,  returned demonstration, and needs further education  HOME EXERCISE PROGRAM: Access Code: [email or web address] URL: [email or web address] Date: 12/10/2023 Prepared by: Vernon Prey April Kirstie Peri  Exercises - Corner Balance Feet Together: Eyes Closed With Head Turns  - 1 x daily - 7 x weekly - 2 sets - 10 reps - Side Stepping with Resistance at Ankles and Counter Support  - 1 x daily - 7 x weekly - 2 sets - 10 reps - Forward and Backward Monster Walk with Resistance at Ankles and Counter Support  - 1 x daily - 7 x weekly - 2 sets - 10 reps  ASSESSMENT:  CLINICAL IMPRESSION: Session focused on improving single leg stability and weight tolerance. Continued to work on gait and dynamic balance. Improving arm swing amplitude with gait. Still requires encouragement with coordinating 4 point gait with hiking poles.  OBJECTIVE IMPAIRMENTS: Abnormal gait, decreased activity tolerance, decreased balance, decreased endurance, decreased mobility, decreased strength, improper body mechanics, and postural dysfunction.     GOALS: Goals reviewed with patient? Yes  SHORT TERM GOALS: Target date: 12/29/2023  Pt will be ind with initial HEP Baseline: Goal status: INITIAL  2.  Pt will have improved 5x STS to </=13 sec to demo increasing functional hip strength Baseline:  Goal status: INITIAL  LONG TERM GOALS: Target date: 01/26/2024   Pt will be ind with management and progression of HEP Baseline:  Goal status: INITIAL  2.  Pt will have improved 5x STS to </=13 sec to demo increased functional LE strength and low fall risk Baseline:  Goal status: INITIAL  3.  Pt will have improved FGA to >/=26/30 to be considered low fall risk Baseline:  Goal status: INITIAL  4.   Pt will have improved ABC scale to >/=93% to demo MDC Baseline:  Goal status: INITIAL  5.  Pt will report >/=50% improvement with his balance while walking trails and inclines Baseline:  Goal status: INITIAL   PLAN:  PT FREQUENCY: 2x/week  PT DURATION: 8 weeks  PLANNED INTERVENTIONS: 97164- PT Re-evaluation, 97110-Therapeutic exercises, 97530- Therapeutic activity, 97112- Neuromuscular re-education, 97535- Self Care, 16109- Manual therapy, [phone]- Gait training, [phone]- Aquatic Therapy, Patient/Family education, Balance training, Stair training, Dry Needling, Joint mobilization, Cryotherapy, and Moist heat  PLAN FOR NEXT SESSION: Progress strength and balance exercises for hip strength/stability. Work on [employer]. High level balance for hiking, hiking/gait with hiking poles (4 point pattern?), SLP referral follow up   [facility] April Ma L Elenie Coven, PT, DPT 12/10/2023, 1:57 PM

## 2023-12-11 ENCOUNTER — Ambulatory Visit
Admission: RE | Admit: 2023-12-11 | Discharge: 2023-12-11 | Disposition: A | Discharge status: Home / Self Care | Source: Ambulatory Visit | Attending: Internal Medicine | Admitting: Internal Medicine

## 2023-12-11 DIAGNOSIS — R413 Other amnesia: Secondary | ICD-10-CM

## 2023-12-17 ENCOUNTER — Encounter: Payer: Self-pay | Admitting: Physical Therapy

## 2023-12-17 ENCOUNTER — Ambulatory Visit: Payer: PRIVATE HEALTH INSURANCE | Attending: Internal Medicine | Admitting: Physical Therapy

## 2023-12-17 VITALS — BP 136/79 | HR 64

## 2023-12-17 DIAGNOSIS — M6281 Muscle weakness (generalized): Secondary | ICD-10-CM | POA: Insufficient documentation

## 2023-12-17 DIAGNOSIS — R2689 Other abnormalities of gait and mobility: Secondary | ICD-10-CM | POA: Insufficient documentation

## 2023-12-17 DIAGNOSIS — R2681 Unsteadiness on feet: Secondary | ICD-10-CM | POA: Diagnosis present

## 2023-12-17 NOTE — Therapy (Unsigned)
 OUTPATIENT PHYSICAL THERAPY LOWER EXTREMITY TREATMENT    Patient Name: Christian Parsons MRN: [id number] DOB:June 23, 1946, 78 y.o., male Today's Date: 12/18/2023  END OF SESSION:  PT End of Session - 12/17/23 1448     Visit Number 5    Number of Visits 16    Date for PT Re-Evaluation 01/26/24    Authorization Type Medicare    PT Start Time 1446    PT Stop Time 1529    PT Time Calculation (min) 43 min    Equipment Utilized During Treatment Gait belt    Activity Tolerance Patient tolerated treatment well    Behavior During Therapy WFL for tasks assessed/performed             Past Medical History:  Diagnosis Date   Anxiety    BPH with urinary obstruction    s/p TURP   Chronic kidney disease    Depression    Stage 3a chronic kidney disease (CKD) (HCC)    secondary to lithium toxicity; baseline creatinine 1.3-1.6   Past Surgical History:  Procedure Laterality Date   CYSTOSCOPY  2024   HERNIA REPAIR     PROSTATE SURGERY     TRANSURETHRAL RESECTION OF PROSTATE     Patient Active Problem List   Diagnosis Date Noted   Aortic aneurysm without rupture (HCC) 06/14/2021   Abdominal aortic aneurysm (AAA) without rupture (HCC) 06/14/2021   Atherosclerosis of aorta (HCC) 11/18/2017   Left sided sciatica 11/18/2017   Pes planus 03/13/2017   Knee pain 03/11/2017   BPH with urinary obstruction 08/12/2016   CKD (chronic kidney disease) stage 2 or early 3 07/11/2015   Ventral hernia without obstruction or gangrene 02/17/2015   Diastasis recti 12/22/2014   Erectile dysfunction 12/22/2014   Benign essential tremor 06/16/2013   Migraine headache with aura 12/25/2011   BMI 31.0-31.9,adult 12/25/2011   Right bundle branch block 12/25/2011   Mood disorder (HCC)     PCP: Thana Ates, MD  REFERRING PROVIDER: Thana Ates, MD  REFERRING DIAG: R26.89 (ICD-10-CM) - Other abnormalities of gait and mobility  THERAPY DIAG:  Unsteadiness on feet  Other abnormalities of gait and  mobility  Muscle weakness (generalized)  Rationale for Evaluation and Treatment: Rehabilitation  ONSET DATE: <5 years slow deterioration  SUBJECTIVE:   SUBJECTIVE STATEMENT: Patient reports that he has been hiking without trekking poles on [place] where paved but has been using them on dirt paths. Is getting worked up for cognition. Denies falls and near falls since last here.   PERTINENT HISTORY: From H&P: "78 year old male with H/O HTN, ascending aortic aneurysm, bipolar disorder, BPH-status post TURP 2016, ED and migraine headaches-seen for follow-up of CKD 3."  Per PT order from Dr. Margaretann Loveless: "pt very good endurance and regular hiking but slowly progressive balance problems, teetering, falls. Requesting help with focus on balance"  PAIN:  Are you having pain? No currently Will get some pain in R lateral hip when crossing legs in figure 4 position  PRECAUTIONS: Ascending aortic aneurysm, falls  RED FLAGS: None   WEIGHT BEARING RESTRICTIONS: No  FALLS:  Has patient fallen in last 6 months? Yes. Number of falls 1 -- [employer] standing at counter and turned to the left and fell when his foot/shoe caught on the floor  LIVING ENVIRONMENT: Lives with: lives with their spouse Lives in: House/apartment Stairs: Yes: Internal: 1 flight steps; on right going up Has following equipment at home: None  OCCUPATION: Retired -- hiking 3-4 times  a week 5 miles  PLOF: Independent  PATIENT GOALS: Improve balance for safer hiking on trails; big reaching goal is to return to men's group hiking (has not been able to keep up)  NEXT MD VISIT: n/a  OBJECTIVE:  Note: Objective measures were completed at Evaluation unless otherwise noted.  DIAGNOSTIC FINDINGS: n/a  PATIENT SURVEYS:  Total ABC score: 1290 / 1600 = 80.6 %  COGNITION: Overall cognitive status: Short term memory impairment?     SENSATION: WFL "A touch of plantar fasciitis with pins/needles feeling"  POSTURE: rounded  shoulders and forward head. Bilat foot pronation.   PALPATION: R greater trochanter/glute med tenderness s/p fall   LOWER EXTREMITY ROM:  Active ROM Right eval Left eval  Hip flexion    Hip extension    Hip abduction    Hip adduction    Hip internal rotation    Hip external rotation    Knee flexion    Knee extension    Ankle dorsiflexion    Ankle plantarflexion    Ankle inversion    Ankle eversion     (Blank rows = not tested)  LOWER EXTREMITY MMT:  MMT Right eval Left eval  Hip flexion 5 5  Hip extension 4- 4  Hip abduction 3+ 3+  Hip adduction    Hip internal rotation    Hip external rotation    Knee flexion 5 5  Knee extension 5 5  Ankle dorsiflexion 5 5  Ankle plantarflexion    Ankle inversion 5 5  Ankle eversion 5 5   (Blank rows = not tested)  LOWER EXTREMITY SPECIAL TESTS:  Did not assess  FUNCTIONAL TESTS:  5 times sit to stand: 19.94 sec Plank forearm/feet 34.94 sec (R knee slightly bent) Berg Balance: 49/56 FGA: 17/30  GAIT: Distance walked: Into clinic Assistive device utilized: None Level of assistance: Complete Independence Comments: Forward head, thoracic kyphosis, mild truncal lean                                                                                                                                TREATMENT DATE: 12/18/2023  Self Care: Vitals:   12/17/23 1456  BP: 136/79  Pulse: 64  Vitals assessed as noted above on LUE, mildly elevated but WFL for therapy  Educated extensively on fall safety, PT with considerable concern that patient is ambulating on greenway without AD, PT recommends use of bilateral trekking poles on greenway and given ongoing cognitive workup advises going on hikes/walks with friends; patient somewhat resistant stating does not think necessary at this time  Gait:  GAIT: Gait pattern: decreased arm swing- Left, lateral lean- Left, and wide BOS, mild L foot scuffing Distance walked: various clinic  distances Assistive device utilized: None Level of assistance: SBA Comments: lateral deviations walking into clinic, mild signs of unsteadiness, when PT addresses with patient, patient states "just because I was sitting for so long"    GAIT: Gait  pattern: decreased arm swing- Left, lateral lean- Left, and wide BOS Distance walked: 1 x 115' and 2 x 50' Assistive device utilized: bilateral trekking poles Comments: reduced truncal lean and steadiness with two poles instead of one, appropriate and safe for hiking, reinforce use at this time   Mod A for PT to don L townsend brace; given patient pes planus foot posturing poor fit but attempted to trial, would not recommend at this time but patient does have custom orthotic in boots and advised bringing in to next session    GAIT: Gait pattern: decreased arm swing- Left, lateral lean- Left, and wide BOS Distance walked: 1 x 50' Assistive device utilized: trekking poles + L townsend brace Level of assistance: SBA Comments: does not improve gait or foot clearance in remarkable way, patient reports that it is unsteady  NMR:  6 Blaze pods on random setting for improved stepping reaction and modified single leg stance. Performed on 1 minute intervals with 30 rest periods.  Pt requires CGA guarding. Round 1:  3 blaze pods each on first and second step with use of bilateral trekking poles setup.  28 hits. Round 2:  3 blaze pods each on first and second step with use of single trekking pole in L hand setup.  23 hits. Round 3:  3 blaze pods each on first and second step with use of single trekking pole in R hand setup.  32 hits. Notable errors/deficits:  CGA otherwise minA 1x due to posterior instability  Educated on safety awareness with falls based on blaze pod results    PATIENT EDUCATIN:  Education details: see below  Person educated: Patient Education method: Programmer, multimedia, Demonstration, and Handouts Education comprehension: verbalized  understanding, returned demonstration, and needs further education  HOME EXERCISE PROGRAM: Access Code: [email or web address] URL: [email or web address] Date: 12/10/2023 Prepared by: Vernon Prey April Kirstie Peri  Exercises - Corner Balance Feet Together: Eyes Closed With Head Turns  - 1 x daily - 7 x weekly - 2 sets - 10 reps - Side Stepping with Resistance at Ankles and Counter Support  - 1 x daily - 7 x weekly - 2 sets - 10 reps - Forward and Backward Monster Walk with Resistance at Ankles and Counter Support  - 1 x daily - 7 x weekly - 2 sets - 10 reps  ASSESSMENT:  CLINICAL IMPRESSION: Session focused on education regarding cognition and AD use and safety as well as ongoing gait training, AFO trial, and stepping reaction. Patient with mild agitation with safety education during session with improved buy in over course of session though carryover for home unknown. Awaiting MRI results. Discussed briefly SLP referral and patient reaching out but has not done at this time. Continue POC as able.   OBJECTIVE IMPAIRMENTS: Abnormal gait, decreased activity tolerance, decreased balance, decreased endurance, decreased mobility, decreased strength, improper body mechanics, and postural dysfunction.     GOALS: Goals reviewed with patient? Yes  SHORT TERM GOALS: Target date: 12/29/2023  Pt will be ind with initial HEP Baseline: Goal status: INITIAL  2.  Pt will have improved 5x STS to </=13 sec to demo increasing functional hip strength Baseline:  Goal status: INITIAL  LONG TERM GOALS: Target date: 01/26/2024   Pt will be ind with management and progression of HEP Baseline:  Goal status: INITIAL  2.  Pt will have improved 5x STS to </=13 sec to demo increased functional LE strength and low fall risk Baseline:  Goal status: INITIAL  3.  Pt will  have improved FGA to >/=26/30 to be considered low fall risk Baseline:  Goal status: INITIAL  4.  Pt will have improved ABC scale to  >/=93% to demo MDC Baseline:  Goal status: INITIAL  5.  Pt will report >/=50% improvement with his balance while walking trails and inclines Baseline:  Goal status: INITIAL   PLAN:  PT FREQUENCY: 2x/week  PT DURATION: 8 weeks  PLANNED INTERVENTIONS: 97164- PT Re-evaluation, 97110-Therapeutic exercises, 97530- Therapeutic activity, 97112- Neuromuscular re-education, 97535- Self Care, [id number]- Manual therapy, [phone]- Gait training, [phone]- Aquatic Therapy, Patient/Family education, Balance training, Stair training, Dry Needling, Joint mobilization, Cryotherapy, and Moist heat  PLAN FOR NEXT SESSION: Progress strength and balance exercises for hip strength/stability. Work on [employer]. High level balance for hiking, hiking/gait with hiking poles (4 point pattern?), SLP referral follow up, safety on hikes, use of AD, are MRI results in, gait training in hiking boots with custom orthotic    Carmelia Bake, PT, DPT 12/18/2023, 9:09 AM

## 2023-12-22 ENCOUNTER — Ambulatory Visit: Admitting: Physical Therapy

## 2023-12-22 VITALS — BP 147/94 | HR 60

## 2023-12-22 DIAGNOSIS — R2689 Other abnormalities of gait and mobility: Secondary | ICD-10-CM

## 2023-12-22 DIAGNOSIS — R2681 Unsteadiness on feet: Secondary | ICD-10-CM

## 2023-12-22 DIAGNOSIS — M6281 Muscle weakness (generalized): Secondary | ICD-10-CM

## 2023-12-22 NOTE — Therapy (Signed)
 OUTPATIENT PHYSICAL THERAPY LOWER EXTREMITY TREATMENT    Patient Name: Christian Parsons MRN: [id number] DOB:1945-10-24, 78 y.o., male Today's Date: 12/22/2023  END OF SESSION:  PT End of Session - 12/22/23 1447     Visit Number 6    Number of Visits 16    Date for PT Re-Evaluation 01/26/24    Authorization Type Medicare    PT Start Time 1445    PT Stop Time 1530    PT Time Calculation (min) 45 min    Equipment Utilized During Treatment Gait belt    Activity Tolerance Patient tolerated treatment well    Behavior During Therapy WFL for tasks assessed/performed              Past Medical History:  Diagnosis Date   Anxiety    BPH with urinary obstruction    s/p TURP   Chronic kidney disease    Depression    Stage 3a chronic kidney disease (CKD) (HCC)    secondary to lithium toxicity; baseline creatinine 1.3-1.6   Past Surgical History:  Procedure Laterality Date   CYSTOSCOPY  2024   HERNIA REPAIR     PROSTATE SURGERY     TRANSURETHRAL RESECTION OF PROSTATE     Patient Active Problem List   Diagnosis Date Noted   Aortic aneurysm without rupture (HCC) 06/14/2021   Abdominal aortic aneurysm (AAA) without rupture (HCC) 06/14/2021   Atherosclerosis of aorta (HCC) 11/18/2017   Left sided sciatica 11/18/2017   Pes planus 03/13/2017   Knee pain 03/11/2017   BPH with urinary obstruction 08/12/2016   CKD (chronic kidney disease) stage 2 or early 3 07/11/2015   Ventral hernia without obstruction or gangrene 02/17/2015   Diastasis recti 12/22/2014   Erectile dysfunction 12/22/2014   Benign essential tremor 06/16/2013   Migraine headache with aura 12/25/2011   BMI 31.0-31.9,adult 12/25/2011   Right bundle branch block 12/25/2011   Mood disorder (HCC)     PCP: Thana Ates, MD  REFERRING PROVIDER: Thana Ates, MD  REFERRING DIAG: R26.89 (ICD-10-CM) - Other abnormalities of gait and mobility  THERAPY DIAG:  Unsteadiness on feet  Other abnormalities of gait and  mobility  Muscle weakness (generalized)  Rationale for Evaluation and Treatment: Rehabilitation  ONSET DATE: <5 years slow deterioration  SUBJECTIVE:   SUBJECTIVE STATEMENT:  Pt reports things are doing "better" since last visit, denies any falls or other acute changes since last visit. Pt reports that since last visit he walked a portion of the [facility] trail and also walked out at [employer], no stumbles, didn't use his hiking poles.   PERTINENT HISTORY: From H&P: "78 year old male with H/O HTN, ascending aortic aneurysm, bipolar disorder, BPH-status post TURP 2016, ED and migraine headaches-seen for follow-up of CKD 3."  Per PT order from Dr. Margaretann Loveless: "pt very good endurance and regular hiking but slowly progressive balance problems, teetering, falls. Requesting help with focus on balance"  PAIN:  Are you having pain? No currently Will get some pain in R lateral hip when crossing legs in figure 4 position  PRECAUTIONS: Ascending aortic aneurysm, falls  RED FLAGS: None   WEIGHT BEARING RESTRICTIONS: No  FALLS:  Has patient fallen in last 6 months? Yes. Number of falls 1 -- [employer] standing at counter and turned to the left and fell when his foot/shoe caught on the floor  LIVING ENVIRONMENT: Lives with: lives with their spouse Lives in: House/apartment Stairs: Yes: Internal: 1 flight steps; on right going  up Has following equipment at home: None  OCCUPATION: Retired -- hiking 3-4 times a week 5 miles  PLOF: Independent  PATIENT GOALS: Improve balance for safer hiking on trails; big reaching goal is to return to men's group hiking (has not been able to keep up)  NEXT MD VISIT: n/a  OBJECTIVE:  Note: Objective measures were completed at Evaluation unless otherwise noted.  DIAGNOSTIC FINDINGS: n/a  PATIENT SURVEYS:  Total ABC score: 1290 / 1600 = 80.6 %  COGNITION: Overall cognitive status: Short term memory impairment?     SENSATION: WFL "A touch of  plantar fasciitis with pins/needles feeling"  POSTURE: rounded shoulders and forward head. Bilat foot pronation.   PALPATION: R greater trochanter/glute med tenderness s/p fall   LOWER EXTREMITY ROM:  Active ROM Right eval Left eval  Hip flexion    Hip extension    Hip abduction    Hip adduction    Hip internal rotation    Hip external rotation    Knee flexion    Knee extension    Ankle dorsiflexion    Ankle plantarflexion    Ankle inversion    Ankle eversion     (Blank rows = not tested)  LOWER EXTREMITY MMT:  MMT Right eval Left eval  Hip flexion 5 5  Hip extension 4- 4  Hip abduction 3+ 3+  Hip adduction    Hip internal rotation    Hip external rotation    Knee flexion 5 5  Knee extension 5 5  Ankle dorsiflexion 5 5  Ankle plantarflexion    Ankle inversion 5 5  Ankle eversion 5 5   (Blank rows = not tested)  LOWER EXTREMITY SPECIAL TESTS:  Did not assess  FUNCTIONAL TESTS:  5 times sit to stand: 19.94 sec Plank forearm/feet 34.94 sec (R knee slightly bent) Berg Balance: 49/56 FGA: 17/30  GAIT: Distance walked: Into clinic Assistive device utilized: None Level of assistance: Complete Independence Comments: Forward head, thoracic kyphosis, mild truncal lean                                                                                                                                TREATMENT DATE: 12/22/2023  Self Care: Vitals:   12/22/23 1452  BP: (!) 147/94  Pulse: 60   Vitals assessed as noted above on LUE, elevated but [facility] for therapy. Pt declines to have BP assessed again after a seated rest break to see if a lower reading can be obtained. PT continues to recommend use of bilateral trekking poles on green[place] and given ongoing cognitive workup advises going on hikes/walks with friends and letting friends or family know when he will be out on which trail    NMR:  To work on increasing step length and balance in SLS in // bars without UE  support: Alt L/R forwards stepping over 4" hurdles 6 x 10 ft Alt L/R lateral stepping over 4" hurdles 3  x 10 ft Added in 4# ankle weights for error augmentation Forwards (step to, unable to to step-through, decreased speed) 6 x 10 ft Lateral 3 x 10 ft Forwards/backwards stepping over 2" foam beam X 10 reps with BUE support X 10 reps no UE support Poor eccentric control when stepping back and lowering LE Returned to 4" hurdles step to with progression to step-through gait pattern once weights removed Pt does still knock over several hurdles but overall exhibits improved LE control with hurdle navigation  TherAct Sit to stand onto blue wedge x 10 reps Focus on eccentric control with sitting to prevent "plop" No UE support for 2nd set x 10 reps   PATIENT EDUCATIN:  Education details: see above, continue HEP Person educated: Patient Education method: Explanation and Demonstration Education comprehension: verbalized understanding, returned demonstration, and needs further education  HOME EXERCISE PROGRAM: Access Code: [email or web address] URL: [email or web address] Date: 12/10/2023 Prepared by: Vernon Prey April Kirstie Peri  Exercises - Corner Balance Feet Together: Eyes Closed With Head Turns  - 1 x daily - 7 x weekly - 2 sets - 10 reps - Side Stepping with Resistance at Ankles and Counter Support  - 1 x daily - 7 x weekly - 2 sets - 10 reps - Forward and Backward Monster Walk with Resistance at Ankles and Counter Support  - 1 x daily - 7 x weekly - 2 sets - 10 reps  ASSESSMENT:  CLINICAL IMPRESSION: Emphasis of skilled PT session on continuing to work on dynamic balance with obstacle navigation, SLS, and to work on preventing posterior LOB. Pt initially with some difficulty navigating over 4" hurdles but with decreased speed and increased focus on LE control he exhibits decreased errors. Pt also exhibits good response to error augmentation with decreased errors made once 4# weights  are removed. Pt does still exhibit some posterior LOB, continues to benefit from skilled PT services to work on improving his balance and decreasing his fall risk. Continue POC.   OBJECTIVE IMPAIRMENTS: Abnormal gait, decreased activity tolerance, decreased balance, decreased endurance, decreased mobility, decreased strength, improper body mechanics, and postural dysfunction.     GOALS: Goals reviewed with patient? Yes  SHORT TERM GOALS: Target date: 12/29/2023  Pt will be ind with initial HEP Baseline: Goal status: INITIAL  2.  Pt will have improved 5x STS to </=13 sec to demo increasing functional hip strength Baseline:  Goal status: INITIAL  LONG TERM GOALS: Target date: 01/26/2024   Pt will be ind with management and progression of HEP Baseline:  Goal status: INITIAL  2.  Pt will have improved 5x STS to </=13 sec to demo increased functional LE strength and low fall risk Baseline:  Goal status: INITIAL  3.  Pt will have improved FGA to >/=26/30 to be considered low fall risk Baseline:  Goal status: INITIAL  4.  Pt will have improved ABC scale to >/=93% to demo MDC Baseline:  Goal status: INITIAL  5.  Pt will report >/=50% improvement with his balance while walking trails and inclines Baseline:  Goal status: INITIAL   PLAN:  PT FREQUENCY: 2x/week  PT DURATION: 8 weeks  PLANNED INTERVENTIONS: 97164- PT Re-evaluation, 97110-Therapeutic exercises, 97530- Therapeutic activity, 97112- Neuromuscular re-education, 97535- Self Care, 16109- Manual therapy, [phone]- Gait training, [phone]- Aquatic Therapy, Patient/Family education, Balance training, Stair training, Dry Needling, Joint mobilization, Cryotherapy, and Moist heat  PLAN FOR NEXT SESSION: Progress strength and balance exercises for hip strength/stability. Work on [employer]. High level balance for hiking, hiking/gait  with hiking poles (4 point pattern?), SLP referral follow up, safety on hikes, use of  AD, are MRI results in, gait training in hiking boots with custom orthotic, stepping backwards throws off balance  Pt to bring his hiking shoes and trekking poles to a future session if it is not raining   Peter Congo, PT Peter Congo, PT, DPT, CSRS  12/22/2023, 3:30 PM

## 2023-12-24 ENCOUNTER — Ambulatory Visit: Admitting: Physical Therapy

## 2023-12-29 ENCOUNTER — Encounter: Payer: Self-pay | Admitting: Physical Therapy

## 2023-12-29 ENCOUNTER — Telehealth: Payer: Self-pay | Admitting: Physical Therapy

## 2023-12-29 ENCOUNTER — Ambulatory Visit: Admitting: Physical Therapy

## 2023-12-29 DIAGNOSIS — R2681 Unsteadiness on feet: Secondary | ICD-10-CM | POA: Diagnosis not present

## 2023-12-29 DIAGNOSIS — R2689 Other abnormalities of gait and mobility: Secondary | ICD-10-CM

## 2023-12-29 DIAGNOSIS — M6281 Muscle weakness (generalized): Secondary | ICD-10-CM

## 2023-12-29 NOTE — Telephone Encounter (Signed)
 Physical therapist during PT session called patient spouse with patient's consent, to discuss growing concern with memory changes. PT ended call when patient requested.   Camella Cave, PT, DPT

## 2023-12-29 NOTE — Therapy (Signed)
 OUTPATIENT PHYSICAL THERAPY LOWER EXTREMITY TREATMENT / DISCHARGE   Patient Name: Christian Parsons MRN: [id number] DOB:08-24-1946, 78 y.o., male Today's Date: 12/29/2023  PHYSICAL THERAPY DISCHARGE SUMMARY  Visits from Start of Care: 7  Current functional level related to goals / functional outcomes: Unable to assess, patient became very upset when PT mentioned to patient/patient spouse concern about driving and left session before testing could be assessed    Remaining deficits: Memory changes (not recalling or following PT advise - to hike with someone else, with trekking poles on greenways/paths), falls risk   Education / Equipment: Recommend use of trekking poles for all outdoor activity    Patient agrees to discharge. Patient goals were  unable to be assessed for reasons noted above . Patient is being discharged due to  primary remaining deficit is growing concern for cognitive changes.  END OF SESSION:  PT End of Session - 12/29/23 1452     Visit Number 7    Number of Visits 16    Date for PT Re-Evaluation 01/26/24    Authorization Type Medicare    PT Start Time 1452    PT Stop Time 1508    PT Time Calculation (min) 16 min    Equipment Utilized During Treatment Gait belt    Activity Tolerance Patient tolerated treatment well    Behavior During Therapy Agitated              Past Medical History:  Diagnosis Date   Anxiety    BPH with urinary obstruction    s/p TURP   Chronic kidney disease    Depression    Stage 3a chronic kidney disease (CKD) (HCC)    secondary to lithium toxicity; baseline creatinine 1.3-1.6   Past Surgical History:  Procedure Laterality Date   CYSTOSCOPY  2024   HERNIA REPAIR     PROSTATE SURGERY     TRANSURETHRAL RESECTION OF PROSTATE     Patient Active Problem List   Diagnosis Date Noted   Aortic aneurysm without rupture (HCC) 06/14/2021   Abdominal aortic aneurysm (AAA) without rupture (HCC) 06/14/2021   Atherosclerosis of aorta  (HCC) 11/18/2017   Left sided sciatica 11/18/2017   Pes planus 03/13/2017   Knee pain 03/11/2017   BPH with urinary obstruction 08/12/2016   CKD (chronic kidney disease) stage 2 or early 3 07/11/2015   Ventral hernia without obstruction or gangrene 02/17/2015   Diastasis recti 12/22/2014   Erectile dysfunction 12/22/2014   Benign essential tremor 06/16/2013   Migraine headache with aura 12/25/2011   BMI 31.0-31.9,adult 12/25/2011   Right bundle branch block 12/25/2011   Mood disorder (HCC)     PCP: Thana Ates, MD  REFERRING PROVIDER: Thana Ates, MD  REFERRING DIAG: R26.89 (ICD-10-CM) - Other abnormalities of gait and mobility  THERAPY DIAG:  Unsteadiness on feet  Other abnormalities of gait and mobility  Muscle weakness (generalized)  Rationale for Evaluation and Treatment: Rehabilitation  ONSET DATE: <5 years slow deterioration  SUBJECTIVE:   SUBJECTIVE STATEMENT:  Pt arrives to session carrying trekking poles but not using. Reports no recollection of PT prior request of bringing hiking boots. Denies falls. Reports continuing to hike alone and without trekking poles on greenway.   PERTINENT HISTORY: From H&P: "78 year old male with H/O HTN, ascending aortic aneurysm, bipolar disorder, BPH-status post TURP 2016, ED and migraine headaches-seen for follow-up of CKD 3."  Per PT order from Dr. Margaretann Loveless: "pt very good endurance and regular hiking but slowly progressive balance  problems, teetering, falls. Requesting help with focus on balance"  PAIN:  Are you having pain? No currently Will get some pain in R lateral hip when crossing legs in figure 4 position  PRECAUTIONS: Ascending aortic aneurysm, falls  RED FLAGS: None   WEIGHT BEARING RESTRICTIONS: No  FALLS:  Has patient fallen in last 6 months? Yes. Number of falls 1 -- [employer] standing at counter and turned to the left and fell when his foot/shoe caught on the floor  LIVING ENVIRONMENT: Lives with:  lives with their spouse Lives in: House/apartment Stairs: Yes: Internal: 1 flight steps; on right going up Has following equipment at home: None  OCCUPATION: Retired -- hiking 3-4 times a week 5 miles  PLOF: Independent  PATIENT GOALS: Improve balance for safer hiking on trails; big reaching goal is to return to men's group hiking (has not been able to keep up)  NEXT MD VISIT: n/a  OBJECTIVE:  Note: Objective measures were completed at Evaluation unless otherwise noted.  DIAGNOSTIC FINDINGS: n/a  PATIENT SURVEYS:  Total ABC score: 1290 / 1600 = 80.6 %  COGNITION: Overall cognitive status: Short term memory impairment                                                                                                                        TREATMENT DATE: 12/29/2023  Self Care: Patient reports subjective as noted above, no retention from prior PT conversations about safety measures, memory changes, use of AD, presence of someone else on hikes, etc Physical therapist then explains to patient that there has been little carryover on education and major deficits seem to be linked primarily to cognition PT expresses concern with patient about hiking alone, parking by himself and forgetting where he has parked, etc; discuss follow up with neurology but patient becomes agitated and states that does not want to be locked up by neurologist, PT then requests to speak to patient's spouse via phone call to express concern about memory which patient agrees to have PT speak with spouse about memory concerns PT expresses concern on call with spouse with patient present about patient hiking alone as well as driving given cognition/memory concerns and that this may be worth conversation about necessary safety steps with patient's PCP, patient becomes extremely agitated when PT mentions driving and states "you are a liar" and "we did not talk about you saying that' before marching out and leaving before PT can  provide further explanation, PT ends the call with patient's caregiver as soon as requested by patient  PATIENT EDUCATIN:  Education details: see above Person educated: Patient Education method: Medical illustrator Education comprehension: verbalized understanding, returned demonstration, and needs further education  HOME EXERCISE PROGRAM: Access Code: [email or web address] URL: [email or web address] Date: 12/10/2023 Prepared by: Gellen April Erman Hayward  Exercises - Corner Balance Feet Together: Eyes Closed With Head Turns  - 1 x daily - 7 x weekly - 2 sets - 10 reps - Side Stepping with Resistance  at Ankles and Counter Support  - 1 x daily - 7 x weekly - 2 sets - 10 reps - Forward and Backward Monster Walk with Resistance at Ankles and Counter Support  - 1 x daily - 7 x weekly - 2 sets - 10 reps  ASSESSMENT:  CLINICAL IMPRESSION: Session very limited due to patient's agitation/cognitive limitations during session. See above for details of session. D/C with recommendation for patient and caregiver to have additional supports for ongoing memory changes as these seem to be primary remaining deficits.    OBJECTIVE IMPAIRMENTS: Abnormal gait, decreased activity tolerance, decreased balance, decreased endurance, decreased mobility, decreased strength, improper body mechanics, and postural dysfunction.     GOALS: Goals reviewed with patient? Yes  SHORT TERM GOALS: Target date: 12/29/2023  Pt will be ind with initial HEP Baseline: not recalling PT education Goal status: NOT MET  2.  Pt will have improved 5x STS to </=13 sec to demo increasing functional hip strength Baseline:  Goal status: unable to be assessed - patient leaves session early  LONG TERM GOALS: Target date: 01/26/2024   Pt will be ind with management and progression of HEP Baseline:  Goal status: unable to be assessed - patient leaves session early  2.  Pt will have improved 5x STS to </=13 sec to  demo increased functional LE strength and low fall risk Baseline:  Goal status:unable to be assessed - patient leaves session early  3.  Pt will have improved FGA to >/=26/30 to be considered low fall risk Baseline:  Goal status: unable to be assessed - patient leaves session early  4.  Pt will have improved ABC scale to >/=93% to demo MDC Baseline:  Goal status: unable to be assessed - patient leaves session early  5.  Pt will report >/=50% improvement with his balance while walking trails and inclines Baseline:  Goal status: unable to be assessed - patient leaves session early   PLAN:  PT FREQUENCY: 2x/week  PT DURATION: 8 weeks  PLANNED INTERVENTIONS: 97164- PT Re-evaluation, 97110-Therapeutic exercises, 97530- Therapeutic activity, 97112- Neuromuscular re-education, 97535- Self Care, 14782- Manual therapy, [phone]- Gait training, [phone]- Aquatic Therapy, Patient/Family education, Balance training, Stair training, Dry Needling, Joint mobilization, Cryotherapy, and Moist heat  PLAN FOR NEXT SESSION: NA - D/C from PT, recommending follow up for memory   Coreen Devoid, PT, DPT  12/29/2023, 3:36 PM

## 2023-12-29 NOTE — Telephone Encounter (Signed)
 Physical therapist called and LVM to patient's PCP about patient's concern about being "locked up by neurology," ongoing memory changes, and discharge from PT. Recommend further conversations with PCP about support systems for patient and family regarding memory changes.  Camella Cave, PT, DPT

## 2023-12-31 ENCOUNTER — Ambulatory Visit: Admitting: Physical Therapy

## 2024-01-05 ENCOUNTER — Ambulatory Visit: Admitting: Physical Therapy

## 2024-01-07 ENCOUNTER — Ambulatory Visit: Admitting: Physical Therapy

## 2024-01-12 ENCOUNTER — Ambulatory Visit: Admitting: Physical Therapy

## 2024-01-14 ENCOUNTER — Ambulatory Visit: Admitting: Physical Therapy

## 2024-01-19 ENCOUNTER — Ambulatory Visit: Admitting: Physical Therapy

## 2024-01-21 ENCOUNTER — Ambulatory Visit: Admitting: Physical Therapy

## 2024-01-21 ENCOUNTER — Other Ambulatory Visit: Payer: Self-pay | Admitting: Cardiology

## 2024-01-26 ENCOUNTER — Ambulatory Visit: Admitting: Physical Therapy

## 2024-01-28 ENCOUNTER — Ambulatory Visit: Admitting: Physical Therapy

## 2024-02-04 ENCOUNTER — Ambulatory Visit: Attending: Cardiology | Admitting: Cardiology

## 2024-02-04 ENCOUNTER — Encounter: Payer: Self-pay | Admitting: Cardiology

## 2024-02-04 VITALS — BP 124/82 | HR 87 | Ht 72.0 in | Wt 228.4 lb

## 2024-02-04 DIAGNOSIS — N1832 Chronic kidney disease, stage 3b: Secondary | ICD-10-CM | POA: Diagnosis not present

## 2024-02-04 DIAGNOSIS — I10 Essential (primary) hypertension: Secondary | ICD-10-CM | POA: Insufficient documentation

## 2024-02-04 DIAGNOSIS — I7121 Aneurysm of the ascending aorta, without rupture: Secondary | ICD-10-CM | POA: Diagnosis present

## 2024-02-04 MED ORDER — METOPROLOL SUCCINATE ER 25 MG PO TB24: 12.5000 mg | ORAL_TABLET | Freq: Every day | ORAL | Status: DC

## 2024-02-04 NOTE — Patient Instructions (Signed)
 Medication Instructions:  No medication changes.  *If you need a refill on your cardiac medications before your next appointment, please call your pharmacy*  Lab Work: None ordered today. If you have labs (blood work) drawn today and your tests are completely normal, you will receive your results only by: MyChart Message (if you have MyChart) OR A paper copy in the mail If you have any lab test that is abnormal or we need to change your treatment, we will call you to review the results.  Testing/Procedures: Your physician has requested that you have an echocardiogram to be completed in December 2025. Echocardiography is a painless test that uses sound waves to create images of your heart. It provides your doctor with information about the size and shape of your heart and how well your heart's chambers and valves are working. This procedure takes approximately one hour. There are no restrictions for this procedure. Please do NOT wear cologne, perfume, aftershave, or lotions (deodorant is allowed). Please arrive 15 minutes prior to your appointment time.  Please note: We ask at that you not bring children with you during ultrasound (echo/ vascular) testing. Due to room size and safety concerns, children are not allowed in the ultrasound rooms during exams. Our front office staff cannot provide observation of children in our lobby area while testing is being conducted. An adult accompanying a patient to their appointment will only be allowed in the ultrasound room at the discretion of the ultrasound technician under special circumstances. We apologize for any inconvenience.   Follow-Up: At [facility], you and your health needs are our priority.  As part of our continuing mission to provide you with exceptional heart care, our providers are all part of one team.  This team includes your primary Cardiologist (physician) and Advanced Practice Providers or APPs (Physician Assistants and Nurse  Practitioners) who all work together to provide you with the care you need, when you need it.  Your next appointment:   7 month(s) (December 2025) after echo is complete  Provider:   Knox Perl, MD

## 2024-02-04 NOTE — Progress Notes (Signed)
 Cardiology Office Note:  .   Date:  02/04/2024  ID:  Christian Parsons, DOB July 18, 1946, MRN [id number] PCP: Tena Feeling, MD  [facility] Providers Cardiologist:  Knox Perl, MD   History of Present Illness: .   Christian Parsons is a 78 y.o. Caucasian male patient with known ascending aortic aneurysm at 4.4 to 4.5 cm diagnosed in 2020, stage IIIa chronic kidney disease secondary to lithium  toxicity, who is very active and walks at least 30 miles a week and also hikes.  Patient is very particular regarding his medication use and also follows up at [facility] clinic.   Discussed the use of AI scribe software for clinical note transcription with the patient, who gave verbal consent to proceed.  History of Present Illness Christian Parsons is a 78 year old male with hypertension and chronic kidney disease who presents with decreased endurance during hiking. He experiences a significant decrease in hiking pace, which he associates with an increase in blood pressure medications. Endurance is diminished, but there is no shortness of breath. He takes metoprolol  in the morning and ramipril  at night. Delaying metoprolol  until after morning hikes improves his performance.  An echocardiogram from December shows stable measurements of the aortic root and ascending aorta, consistent with findings from 2020.  Labs   Lab Results  Component Value Date   CHOL 140 04/17/2022   HDL 34.80 (L) 04/17/2022   LDLCALC 86 04/17/2022   TRIG 96.0 04/17/2022   CHOLHDL 4 04/17/2022   Lab Results  Component Value Date   NA 142 04/17/2022   K 4.4 04/17/2022   CO2 28 04/17/2022   GLUCOSE 103 (H) 04/17/2022   BUN 24 (H) 04/17/2022   CREATININE 1.71 (H) 04/17/2022   CALCIUM 9.6 04/17/2022   GFR 39.07 (L) 04/17/2022   EGFR 45 (L) 08/27/2021   GFRNONAA 45 (L) 09/28/2019   External Labs:  Care Everywhere labs :12/09/2023  Glucose 92 70-99 mg/dL    BUN 30 3-29 mg/dL    Creatinine 5.18 8.41-6.60 mg/dl    [id number]  37 >93 calc   Sodium 143 136-145 mmol/L    Potassium 4.8 3.5-5.5 mmol/L    Chloride 109 98-107 mmol/L    CO2 27 22-32 mmol/L    Anion Gap 11.6 6.0-20.0 mmol/L    Calcium 9.7 8.6-10.3 mg/dL    CA-corrected 2.35 5.73-22.02 mg/dL    Protein, Total 6.9 5.4-2.7 g/dL    Albumin 4.4 0.6-2.3 g/dL    TBIL 0.6 7.6-2.8 mg/dL    ALP 73 31-517 U/L    AST 20 0-39 U/L    ALT 23 0-52 U/L    CBC with Diff   WBC 5.7 4.0-11.0 K/ul    RBC 4.85 4.20-5.80 M/uL    HGB 15.5 13.0-17.0 g/dL    HCT 61.6 07.3-71.0 %    MCV 95.1 80.0-94.0 fL    MCH 31.9 27.0-33.0 pg    MCHC 33.6 32.0-36.0 g/dL    RDW 62.6 94.8-54.6 %    PLT 157 150-400 K/uL    MPV 9.3 7.5-10.7 fL    NE% 72.2 43.3-71.9 %    LY% 14.9 16.8-43.5 %    MO% 10.5 4.6-12.4 %    EO% 1.9 0.0-7.8 %    BA% 0.5 0.0-1.0 %    NE# 4.1 1.9-7.2 K/uL    LY# 0.90 1.10-2.70 K/uL    MO# 0.6 0.3-0.8 K/uL    EO# 0.1 0.0-0.6 K/uL    BA# 0.0 0.0-0.1 K/uL    NRBC%  0.10      NRBC# 0.01       Add on 12-09-2023  Cholesterol 139 <200 mg/dL    CHOL/HDL 4.3 0.9-8.1 Ratio    HDLD 32 30-70 mg/dL Values below 40 mg/dL indicate increased risk factor  Triglyceride 181 0-199 mg/dL    NHDL 191 4-782 mg/dL Range dependent upon risk factors.  LDL Chol Calc (NIH) 76 0-99 mg/dL     [facility] clinic 95/62/1308:  BUN 25, creatinine 1.74, EGFR 40 mL.  Hb 15.0/HCT 44.9, platelets 139.  ROS  Review of Systems  Cardiovascular:  Negative for chest pain, dyspnea on exertion and leg swelling.   Physical Exam:   VS:  BP 124/82 (BP Location: Left Arm, Patient Position: Sitting, Cuff Size: Normal)   Pulse 87   Ht 6' (1.829 m)   Wt 228 lb 6.4 oz (103.6 kg)   BMI 30.98 kg/m    Wt Readings from Last 3 Encounters:  02/04/24 228 lb 6.4 oz (103.6 kg)  08/05/23 229 lb 12.8 oz (104.2 kg)  03/12/23 228 lb (103.4 kg)    Physical Exam Neck:     Vascular: No carotid bruit or JVD.  Cardiovascular:     Rate and Rhythm: Normal rate and regular rhythm.     Pulses: Intact  distal pulses.     Heart sounds: Normal heart sounds. No murmur heard.    No gallop.  Pulmonary:     Effort: Pulmonary effort is normal.     Breath sounds: Normal breath sounds.  Abdominal:     General: Bowel sounds are normal.     Palpations: Abdomen is soft.  Musculoskeletal:     Right lower leg: No edema.     Left lower leg: No edema.    Studies Reviewed: Aaron Aas    ECHOCARDIOGRAM COMPLETE 09/15/2023  1. Left ventricular ejection fraction, by estimation, is 60 to 65%. The left ventricle has normal function. The left ventricle has no regional wall motion abnormalities. Left ventricular diastolic parameters are indeterminate. 2. Right ventricular systolic function is normal. The right ventricular size is normal. 3. The mitral valve is normal in structure. No evidence of mitral valve regurgitation. No evidence of mitral stenosis. 4. The aortic valve is normal in structure. Aortic valve regurgitation is mild. No aortic stenosis is present. 5. Aortic dilatation noted. There is dilatation of the aortic root, measuring 46 mm. There is dilatation of the ascending aorta, measuring 48 mm. 6. The inferior vena cava is normal in size with greater than 50% respiratory variability, suggesting right atrial pressure of 3 mmHg.  EKG:    EKG Interpretation Date/Time:  Wednesday Feb 04 2024 11:51:11 EDT Ventricular Rate:  82 PR Interval:  234 QRS Duration:  150 QT Interval:  406 QTC Calculation: 474 R Axis:   -33  Text Interpretation: EKG 02/04/2024: Sinus rhythm first-degree AV block at rate of 82 bpm, leftward axis, cannot exclude inferior infarct old.  Right bundle branch block. Compared to 12/02/2022, no significant change. Confirmed by Anselmo Reihl, Jagadeesh ([id number]) on 02/04/2024 12:00:31 PM    Medications and allergies    Allergies  Allergen Reactions   Levaquin [Levofloxacin] Other (See Comments)    Avoid fluoroquinolones if possible due to AAA.     Current Outpatient Medications:     lamoTRIgine  (LAMICTAL ) 150 MG tablet, Take 150 mg by mouth every evening., Disp: , Rfl:    LORazepam  (ATIVAN ) 0.5 MG tablet, Take 1 tablet (0.5 mg total) by mouth as needed., Disp: 30 tablet, Rfl: 0  metoprolol  succinate (TOPROL -XL) 25 MG 24 hr tablet, Take 0.5 tablets (12.5 mg total) by mouth daily., Disp: , Rfl:    ramipril  (ALTACE ) 2.5 MG capsule, TAKE 1 CAPSULE(2.5 MG) BY MOUTH DAILY, Disp: 90 capsule, Rfl: 1   Meds ordered this encounter  Medications   metoprolol  succinate (TOPROL -XL) 25 MG 24 hr tablet    Sig: Take 0.5 tablets (12.5 mg total) by mouth daily.     Medications Discontinued During This Encounter  Medication Reason   aspirin EC 81 MG tablet Discontinued by provider   metoprolol  succinate (TOPROL -XL) 25 MG 24 hr tablet      ASSESSMENT AND PLAN: .      ICD-10-CM   1. Primary hypertension  I10 EKG 12-Lead    EKG 12-Lead    2. Aneurysm of ascending aorta without rupture (HCC)  I71.21 metoprolol  succinate (TOPROL -XL) 25 MG 24 hr tablet    3. Stage 3b chronic kidney disease (HCC)  N18.32       Assessment and Plan Assessment & Plan Ascending aortic aneurysm   The aneurysm measures 46-48 mm and remains unchanged since 2020, with no current need for surgical intervention. Repeat echocardiogram in December to monitor aortic root and ascending aorta size.  Hypertension   Hypertension is well-controlled. He reports decreased endurance, possibly due to metoprolol , affecting his hiking pace and endurance. Reduce metoprolol  succinate dose from 25 mg to 12.5 mg daily and instruct him to take it in the evening. If decreased endurance persists, discontinue metoprolol . If symptoms continue after discontinuation, perform a treadmill test to assess heart rate response.  Chronic kidney disease, stage 3   Chronic kidney disease stage 3A with stable creatinine levels between 1.6 to 1.8 mg/dL. Continue ramipril  2.5 mg once daily to protect kidney function. Monitor kidney function with  blood work every six months.  Signed,  Knox Perl, MD, [facility] 02/04/2024, 12:21 PM [facility] [address] [place], [place] [address] Phone: [phone]. Fax:  [phone]

## 2024-03-10 ENCOUNTER — Encounter: Payer: Self-pay | Admitting: Family Medicine

## 2024-04-24 ENCOUNTER — Other Ambulatory Visit: Payer: Self-pay | Admitting: Cardiology

## 2024-04-24 DIAGNOSIS — I7121 Aneurysm of the ascending aorta, without rupture: Secondary | ICD-10-CM

## 2024-08-25 ENCOUNTER — Ambulatory Visit (HOSPITAL_COMMUNITY)
Admission: RE | Admit: 2024-08-25 | Discharge: 2024-08-25 | Disposition: A | Discharge status: Home / Self Care | Source: Ambulatory Visit | Attending: Cardiovascular Disease | Admitting: Cardiovascular Disease

## 2024-08-25 DIAGNOSIS — I7121 Aneurysm of the ascending aorta, without rupture: Secondary | ICD-10-CM | POA: Diagnosis present

## 2024-08-25 LAB — ECHOCARDIOGRAM COMPLETE
Area-P 1/2: 2.93 cm2
P 1/2 time: 1922 ms
S' Lateral: 3 cm

## 2024-08-26 ENCOUNTER — Ambulatory Visit: Payer: Self-pay | Admitting: Cardiology

## 2024-08-26 NOTE — Progress Notes (Signed)
 No change in ascending aortic aneurysm from 08/05/2023, aneurysm measures 46 mm.  Otherwise normal heart function.  Will discuss more on office visit.

## 2024-08-30 NOTE — Telephone Encounter (Signed)
 Pt states he is returning a call. He said the voice message was from [place], [place] but I don't see a note here so I'm not sure who it came from. Please advise.

## 2024-10-16 ENCOUNTER — Other Ambulatory Visit: Payer: Self-pay | Admitting: Cardiology

## 2024-11-02 ENCOUNTER — Ambulatory Visit: Admitting: Cardiology

## 2024-12-20 ENCOUNTER — Encounter: Admitting: Family Medicine
